# Patient Record
Sex: Female | Born: 1955 | Race: Black or African American | Hispanic: No | Marital: Married | Smoking: Former smoker
Health system: Southern US, Community
[De-identification: ages and names within clinical notes are randomized; demographics above are authoritative.]

## PROBLEM LIST (undated history)

## (undated) DIAGNOSIS — C50919 Malignant neoplasm of unspecified site of unspecified female breast: Secondary | ICD-10-CM

## (undated) DIAGNOSIS — I82409 Acute embolism and thrombosis of unspecified deep veins of unspecified lower extremity: Secondary | ICD-10-CM

## (undated) DIAGNOSIS — G629 Polyneuropathy, unspecified: Secondary | ICD-10-CM

## (undated) DIAGNOSIS — J45909 Unspecified asthma, uncomplicated: Secondary | ICD-10-CM

## (undated) DIAGNOSIS — T7840XA Allergy, unspecified, initial encounter: Secondary | ICD-10-CM

## (undated) DIAGNOSIS — K219 Gastro-esophageal reflux disease without esophagitis: Secondary | ICD-10-CM

## (undated) DIAGNOSIS — I10 Essential (primary) hypertension: Secondary | ICD-10-CM

## (undated) DIAGNOSIS — E119 Type 2 diabetes mellitus without complications: Secondary | ICD-10-CM

## (undated) DIAGNOSIS — B029 Zoster without complications: Secondary | ICD-10-CM

## (undated) HISTORY — PX: BREAST SURGERY: SHX581

## (undated) HISTORY — DX: Allergy, unspecified, initial encounter: T78.40XA

## (undated) HISTORY — PX: CHOLECYSTECTOMY: SHX55

---

## 2002-07-30 ENCOUNTER — Encounter: Payer: Self-pay | Admitting: General Surgery

## 2002-07-30 ENCOUNTER — Encounter (INDEPENDENT_AMBULATORY_CARE_PROVIDER_SITE_OTHER): Payer: Self-pay | Admitting: Specialist

## 2002-07-30 ENCOUNTER — Other Ambulatory Visit: Admission: RE | Admit: 2002-07-30 | Discharge: 2002-07-30 | Payer: Self-pay | Admitting: General Surgery

## 2002-07-30 ENCOUNTER — Encounter: Admission: RE | Admit: 2002-07-30 | Discharge: 2002-07-30 | Payer: Self-pay | Admitting: General Surgery

## 2002-08-12 ENCOUNTER — Encounter: Admission: RE | Admit: 2002-08-12 | Discharge: 2002-08-12 | Payer: Self-pay | Admitting: General Surgery

## 2002-08-12 ENCOUNTER — Encounter: Payer: Self-pay | Admitting: General Surgery

## 2002-08-14 ENCOUNTER — Encounter: Payer: Self-pay | Admitting: General Surgery

## 2002-08-14 ENCOUNTER — Encounter (INDEPENDENT_AMBULATORY_CARE_PROVIDER_SITE_OTHER): Payer: Self-pay | Admitting: *Deleted

## 2002-08-14 ENCOUNTER — Ambulatory Visit (HOSPITAL_BASED_OUTPATIENT_CLINIC_OR_DEPARTMENT_OTHER): Admission: RE | Admit: 2002-08-14 | Discharge: 2002-08-15 | Payer: Self-pay | Admitting: General Surgery

## 2002-08-14 ENCOUNTER — Encounter: Admission: RE | Admit: 2002-08-14 | Discharge: 2002-08-14 | Payer: Self-pay | Admitting: General Surgery

## 2002-08-22 ENCOUNTER — Ambulatory Visit: Admission: RE | Admit: 2002-08-22 | Discharge: 2002-09-16 | Payer: Self-pay | Admitting: General Surgery

## 2002-09-12 ENCOUNTER — Ambulatory Visit (HOSPITAL_BASED_OUTPATIENT_CLINIC_OR_DEPARTMENT_OTHER): Admission: RE | Admit: 2002-09-12 | Discharge: 2002-09-12 | Payer: Self-pay | Admitting: General Surgery

## 2002-09-12 ENCOUNTER — Encounter: Payer: Self-pay | Admitting: General Surgery

## 2002-12-25 ENCOUNTER — Ambulatory Visit: Admission: RE | Admit: 2002-12-25 | Discharge: 2003-03-25 | Payer: Self-pay | Admitting: Radiation Oncology

## 2003-01-01 ENCOUNTER — Encounter: Admission: RE | Admit: 2003-01-01 | Discharge: 2003-01-01 | Payer: Self-pay | Admitting: Radiation Oncology

## 2003-01-08 ENCOUNTER — Ambulatory Visit (HOSPITAL_BASED_OUTPATIENT_CLINIC_OR_DEPARTMENT_OTHER): Admission: RE | Admit: 2003-01-08 | Discharge: 2003-01-08 | Payer: Self-pay | Admitting: General Surgery

## 2003-04-29 ENCOUNTER — Encounter: Payer: Self-pay | Admitting: *Deleted

## 2003-04-29 ENCOUNTER — Ambulatory Visit (HOSPITAL_COMMUNITY): Admission: RE | Admit: 2003-04-29 | Discharge: 2003-04-29 | Payer: Self-pay | Admitting: *Deleted

## 2003-05-08 ENCOUNTER — Ambulatory Visit (HOSPITAL_COMMUNITY): Admission: RE | Admit: 2003-05-08 | Discharge: 2003-05-08 | Payer: Self-pay | Admitting: General Surgery

## 2003-05-08 ENCOUNTER — Encounter: Payer: Self-pay | Admitting: General Surgery

## 2003-05-09 ENCOUNTER — Encounter: Payer: Self-pay | Admitting: General Surgery

## 2003-05-27 ENCOUNTER — Encounter: Payer: Self-pay | Admitting: General Surgery

## 2003-05-27 ENCOUNTER — Ambulatory Visit (HOSPITAL_COMMUNITY): Admission: RE | Admit: 2003-05-27 | Discharge: 2003-05-27 | Payer: Self-pay | Admitting: General Surgery

## 2003-05-27 ENCOUNTER — Encounter (INDEPENDENT_AMBULATORY_CARE_PROVIDER_SITE_OTHER): Payer: Self-pay | Admitting: *Deleted

## 2003-08-11 ENCOUNTER — Encounter: Payer: Self-pay | Admitting: General Surgery

## 2003-08-11 ENCOUNTER — Encounter: Admission: RE | Admit: 2003-08-11 | Discharge: 2003-08-11 | Payer: Self-pay | Admitting: General Surgery

## 2004-06-08 ENCOUNTER — Ambulatory Visit (HOSPITAL_COMMUNITY): Admission: RE | Admit: 2004-06-08 | Discharge: 2004-06-08 | Payer: Self-pay | Admitting: General Surgery

## 2004-08-13 ENCOUNTER — Encounter: Admission: RE | Admit: 2004-08-13 | Discharge: 2004-08-13 | Payer: Self-pay | Admitting: General Surgery

## 2005-01-20 ENCOUNTER — Ambulatory Visit: Payer: Self-pay | Admitting: Hematology & Oncology

## 2005-07-14 ENCOUNTER — Ambulatory Visit: Payer: Self-pay | Admitting: Hematology & Oncology

## 2005-11-11 ENCOUNTER — Encounter: Admission: RE | Admit: 2005-11-11 | Discharge: 2005-11-11 | Payer: Self-pay | Admitting: Hematology & Oncology

## 2006-01-06 ENCOUNTER — Ambulatory Visit: Payer: Self-pay | Admitting: Hematology & Oncology

## 2006-10-04 ENCOUNTER — Ambulatory Visit: Payer: Self-pay | Admitting: Hematology & Oncology

## 2006-10-04 LAB — CBC WITH DIFFERENTIAL/PLATELET
BASO%: 0.2 % (ref 0.0–2.0)
Basophils Absolute: 0 10*3/uL (ref 0.0–0.1)
HGB: 15.3 g/dL (ref 11.6–15.9)
MONO#: 0.7 10*3/uL (ref 0.1–0.9)
Platelets: 288 10*3/uL (ref 145–400)
RBC: 4.92 10*6/uL (ref 3.70–5.32)
RDW: 12.3 % (ref 11.3–14.5)

## 2006-10-04 LAB — COMPREHENSIVE METABOLIC PANEL
ALT: 21 U/L (ref 0–40)
BUN: 14 mg/dL (ref 6–23)
CO2: 28 mEq/L (ref 19–32)
Calcium: 9.8 mg/dL (ref 8.4–10.5)
Chloride: 98 mEq/L (ref 96–112)
Creatinine, Ser: 0.8 mg/dL (ref 0.40–1.20)
Potassium: 3.7 mEq/L (ref 3.5–5.3)
Sodium: 137 mEq/L (ref 135–145)
Total Protein: 7.2 g/dL (ref 6.0–8.3)

## 2006-11-13 ENCOUNTER — Encounter: Admission: RE | Admit: 2006-11-13 | Discharge: 2006-11-13 | Payer: Self-pay | Admitting: Hematology & Oncology

## 2007-04-11 ENCOUNTER — Ambulatory Visit: Payer: Self-pay | Admitting: Hematology & Oncology

## 2007-04-16 LAB — COMPREHENSIVE METABOLIC PANEL
ALT: 27 U/L (ref 0–35)
BUN: 17 mg/dL (ref 6–23)
CO2: 29 mEq/L (ref 19–32)
Calcium: 10 mg/dL (ref 8.4–10.5)
Potassium: 3.7 mEq/L (ref 3.5–5.3)
Sodium: 141 mEq/L (ref 135–145)
Total Bilirubin: 0.4 mg/dL (ref 0.3–1.2)

## 2007-04-16 LAB — CBC WITH DIFFERENTIAL/PLATELET
Basophils Absolute: 0 10*3/uL (ref 0.0–0.1)
HGB: 15.3 g/dL (ref 11.6–15.9)
LYMPH%: 17 % (ref 14.0–48.0)
MCH: 31.2 pg (ref 26.0–34.0)
MCV: 89.1 fL (ref 81.0–101.0)
NEUT#: 11.6 10*3/uL — ABNORMAL HIGH (ref 1.5–6.5)
NEUT%: 77.6 % — ABNORMAL HIGH (ref 39.6–76.8)
Platelets: 314 10*3/uL (ref 145–400)
RDW: 12.6 % (ref 11.3–14.5)
lymph#: 2.5 10*3/uL (ref 0.9–3.3)

## 2007-07-12 ENCOUNTER — Ambulatory Visit: Payer: Self-pay | Admitting: Hematology & Oncology

## 2007-10-18 ENCOUNTER — Encounter: Admission: RE | Admit: 2007-10-18 | Discharge: 2007-10-18 | Payer: Self-pay | Admitting: Hematology & Oncology

## 2007-10-24 ENCOUNTER — Encounter: Admission: RE | Admit: 2007-10-24 | Discharge: 2007-10-24 | Payer: Self-pay | Admitting: Hematology & Oncology

## 2007-10-24 ENCOUNTER — Encounter (INDEPENDENT_AMBULATORY_CARE_PROVIDER_SITE_OTHER): Payer: Self-pay | Admitting: Diagnostic Radiology

## 2008-01-14 ENCOUNTER — Ambulatory Visit: Payer: Self-pay | Admitting: Hematology & Oncology

## 2008-03-31 ENCOUNTER — Ambulatory Visit: Payer: Self-pay | Admitting: Hematology & Oncology

## 2008-04-02 LAB — COMPREHENSIVE METABOLIC PANEL
ALT: 26 U/L (ref 0–35)
AST: 22 U/L (ref 0–37)
Alkaline Phosphatase: 61 U/L (ref 39–117)
CO2: 26 mEq/L (ref 19–32)
Total Bilirubin: 0.2 mg/dL — ABNORMAL LOW (ref 0.3–1.2)
Total Protein: 7.5 g/dL (ref 6.0–8.3)

## 2008-04-02 LAB — CBC WITH DIFFERENTIAL/PLATELET
Basophils Absolute: 0 10*3/uL (ref 0.0–0.1)
Eosinophils Absolute: 0.1 10*3/uL (ref 0.0–0.5)
LYMPH%: 27.4 % (ref 14.0–48.0)
MCH: 31.8 pg (ref 26.0–34.0)
MONO#: 0.5 10*3/uL (ref 0.1–0.9)
MONO%: 4.3 % (ref 0.0–13.0)
NEUT%: 67.6 % (ref 39.6–76.8)
RBC: 4.54 10*6/uL (ref 3.70–5.32)
RDW: 12.4 % (ref 11.3–14.5)
WBC: 11.4 10*3/uL — ABNORMAL HIGH (ref 3.9–10.0)
lymph#: 3.1 10*3/uL (ref 0.9–3.3)

## 2008-04-04 ENCOUNTER — Encounter: Admission: RE | Admit: 2008-04-04 | Discharge: 2008-04-04 | Payer: Self-pay | Admitting: General Surgery

## 2008-04-08 ENCOUNTER — Encounter: Admission: RE | Admit: 2008-04-08 | Discharge: 2008-04-08 | Payer: Self-pay | Admitting: General Surgery

## 2008-05-20 ENCOUNTER — Ambulatory Visit: Payer: Self-pay | Admitting: Hematology & Oncology

## 2011-05-13 NOTE — Op Note (Signed)
   NAME:  Brandi Roberts, Brandi Roberts                            ACCOUNT NO.:  [id number]   MEDICAL RECORD NO.:  [id number]                   PATIENT TYPE:  AMB   LOCATION:  DSC                                  FACILITY:  [facility]   PHYSICIAN:  Angelia Mould. Derrell Lolling, M.D.             DATE OF BIRTH:  Dec 01, 1956   DATE OF PROCEDURE:  01/08/2003  DATE OF DISCHARGE:                                 OPERATIVE REPORT   PREOPERATIVE DIAGNOSIS:  Carcinoma of the right breast.   POSTOPERATIVE DIAGNOSIS:  Carcinoma of the right breast.   PROCEDURE:  Removal of Port-A-Cath.   SURGEON:  Angelia Mould. Derrell Lolling, M.D.   OPERATIVE INDICATION:  This is a 55 year old black female who underwent a  right partial mastectomy and right axillary sentinel lymph node mapping and  biopsy on 08/14/02 for a T1C, N0 tumor.  She has completed her radiation  therapy and her chemotherapy and requests that the Port-A-Cath be removed.  She has been counseled regarding this procedure and is brought to the minor  procedure room at [facility] electively.   DESCRIPTION OF PROCEDURE:  The patient was placed supine on the operating  table and the right anterior chest was prepped and draped in a sterile  fashion.  Xylocaine 1% with epinephrine was used as a local infiltration  anesthetic.  A transverse elliptical incision was made, excising the old  scar, which was somewhat hypertrophied.  Dissection was carried down through  the subcutaneous tissue until I entered the capsule of the port.  The port  was dissected free from the capsule.  There were some adhesions of the hub  of the port that were dissected free, and then we were able to simply remove  the port and the catheter intact.  The tip of the catheter was inspected and  found to be tapered and intact.  Hemostasis was excellent.  The catheter and  port were discarded.  The subcutaneous tissue was closed with interrupted  sutures of 3-0 Vicryl and the skin closed with a running  subcuticular suture  of 4-0 Vicryl and Steri-Strips.  Clean bandages were then placed and the  patient taken to the recovery room in stable condition.  Estimated blood  loss was about 5 cubic centimeters.  Complications:  None.  Sponge, needle,  and instrument counts were correct.                                               Angelia Mould. Derrell Lolling, M.D.    HMI/MEDQ  D:  01/08/2003  T:  01/08/2003  Job:  [id number]   cc:   Merilynn Finland, M.D.  [address] [facility]  [place]  [place] [address]  Fax: [phone]

## 2014-04-18 ENCOUNTER — Emergency Department (HOSPITAL_COMMUNITY): Payer: 59

## 2014-04-18 ENCOUNTER — Encounter (HOSPITAL_COMMUNITY): Payer: Self-pay | Admitting: Emergency Medicine

## 2014-04-18 ENCOUNTER — Emergency Department (HOSPITAL_COMMUNITY)
Admission: EM | Admit: 2014-04-18 | Discharge: 2014-04-18 | Disposition: A | Discharge status: Home / Self Care | Payer: 59 | Attending: Emergency Medicine | Admitting: Emergency Medicine

## 2014-04-18 DIAGNOSIS — Y9389 Activity, other specified: Secondary | ICD-10-CM | POA: Insufficient documentation

## 2014-04-18 DIAGNOSIS — Y929 Unspecified place or not applicable: Secondary | ICD-10-CM | POA: Insufficient documentation

## 2014-04-18 DIAGNOSIS — S63509A Unspecified sprain of unspecified wrist, initial encounter: Secondary | ICD-10-CM | POA: Insufficient documentation

## 2014-04-18 DIAGNOSIS — R296 Repeated falls: Secondary | ICD-10-CM | POA: Insufficient documentation

## 2014-04-18 DIAGNOSIS — S63501A Unspecified sprain of right wrist, initial encounter: Secondary | ICD-10-CM

## 2014-04-18 HISTORY — DX: Acute embolism and thrombosis of unspecified deep veins of unspecified lower extremity: I82.409

## 2014-04-18 HISTORY — DX: Essential (primary) hypertension: I10

## 2014-04-18 HISTORY — DX: Type 2 diabetes mellitus without complications: E11.9

## 2014-04-18 MED ORDER — HYDROCODONE-ACETAMINOPHEN 5-325 MG PO TABS: 2.0000 | ORAL_TABLET | ORAL | Status: DC | PRN

## 2014-04-18 NOTE — Discharge Instructions (Signed)
Joint Sprain °A sprain is a tear or stretch in the ligaments that hold a joint together. Severe sprains may need as long as 3-6 weeks of immobilization and/or exercises to heal completely. Sprained joints should be rested and protected. If not, they can become unstable and prone to re-injury. Proper treatment can reduce your pain, shorten the period of disability, and reduce the risk of repeated injuries. °TREATMENT  °· Rest and elevate the injured joint to reduce pain and swelling. °· Apply ice packs to the injury for 20-30 minutes every 2-3 hours for the next 2-3 days. °· Keep the injury wrapped in a compression bandage or splint as long as the joint is painful or as instructed by your caregiver. °· Do not use the injured joint until it is completely healed to prevent re-injury and chronic instability. Follow the instructions of your caregiver. °· Long-term sprain management may require exercises and/or treatment by a physical therapist. Taping or special braces may help stabilize the joint until it is completely better. °SEEK MEDICAL CARE IF:  °· You develop increased pain or swelling of the joint. °· You develop increasing redness and warmth of the joint. °· You develop a fever. °· It becomes stiff. °· Your hand or foot gets cold or numb. °Document Released: 01/19/2005 Document Revised: 03/05/2012 Document Reviewed: 12/29/2008 °ExitCare® Patient Information ©2014 [employer]. ° °

## 2014-04-18 NOTE — ED Provider Notes (Signed)
CSN: [id number]     Arrival date & time 04/18/14  1056 History   First MD Initiated Contact with Patient 04/18/14 1117     Chief Complaint  Patient presents with  . Wrist Pain     (Consider location/radiation/quality/duration/timing/severity/associated sxs/prior Treatment) Patient is a 58 y.o. female presenting with wrist pain. The history is provided by the patient. No language interpreter was used.  Wrist Pain This is a new problem. The current episode started today. The problem occurs constantly. The problem has been unchanged. Associated symptoms include joint swelling and myalgias. She has tried nothing for the symptoms. The treatment provided moderate relief.  Pt fell onto extended wrist yesterday.   Pt complains of swelling and pain.  Pain is worse with movement  No past medical history on file. No past surgical history on file. No family history on file. History  Substance Use Topics  . Smoking status: Not on file  . Smokeless tobacco: Not on file  . Alcohol Use: Not on file   OB History   No data available     Review of Systems  Musculoskeletal: Positive for joint swelling and myalgias.  All other systems reviewed and are negative.     Allergies  Review of patient's allergies indicates not on file.  Home Medications   Prior to Admission medications   Not on File   There were no vitals taken for this visit. Physical Exam  Constitutional: She is oriented to person, place, and time. She appears well-developed and well-nourished.  Musculoskeletal: She exhibits edema and tenderness.  Swollen tender right wrist,   Decreased range of motion,   nv and ns intact  Neurological: She is alert and oriented to person, place, and time. She has normal reflexes.  Skin: Skin is warm.  Psychiatric: She has a normal mood and affect.    ED Course  Procedures (including critical care time) Labs Review Labs Reviewed - No data to display  Imaging Review No results  found.   EKG Interpretation None      MDM   Final diagnoses:  Sprain of wrist, right    No fx Hydrocodone Wrist splint Follow up with Dr. Luna Glasgow for recheck in 1 week    Fransico Meadow, PA-C 04/18/14 [place], [place] 04/18/14 1212

## 2014-04-18 NOTE — ED Notes (Signed)
Fell and hurt rt wrist/hand yesterday. C/O pain with movement. Rt hand red and swollen.

## 2014-04-20 NOTE — ED Notes (Signed)
Pt called stating she needed a note for work until she could see ortho. Pt already has a note for this past Friday. Pt given a note for 4/27 and 4/28. Pt made aware she needed to follow up with ortho if she needed to be out of work longer

## 2014-04-20 NOTE — ED Provider Notes (Signed)
Medical screening examination/treatment/procedure(s) were performed by non-physician practitioner and as supervising physician I was immediately available for consultation/collaboration.   EKG Interpretation None        Alfonzo Feller, DO 04/20/14 1251

## 2016-12-27 ENCOUNTER — Emergency Department (HOSPITAL_COMMUNITY)
Admission: EM | Admit: 2016-12-27 | Discharge: 2016-12-27 | Disposition: A | Discharge status: Home / Self Care | Payer: 59 | Attending: Emergency Medicine | Admitting: Emergency Medicine

## 2016-12-27 ENCOUNTER — Emergency Department (HOSPITAL_COMMUNITY): Payer: 59

## 2016-12-27 ENCOUNTER — Encounter (HOSPITAL_COMMUNITY): Payer: Self-pay | Admitting: Emergency Medicine

## 2016-12-27 DIAGNOSIS — Y939 Activity, unspecified: Secondary | ICD-10-CM | POA: Insufficient documentation

## 2016-12-27 DIAGNOSIS — Z7984 Long term (current) use of oral hypoglycemic drugs: Secondary | ICD-10-CM | POA: Diagnosis not present

## 2016-12-27 DIAGNOSIS — F17210 Nicotine dependence, cigarettes, uncomplicated: Secondary | ICD-10-CM | POA: Diagnosis not present

## 2016-12-27 DIAGNOSIS — Y999 Unspecified external cause status: Secondary | ICD-10-CM | POA: Insufficient documentation

## 2016-12-27 DIAGNOSIS — I10 Essential (primary) hypertension: Secondary | ICD-10-CM | POA: Diagnosis not present

## 2016-12-27 DIAGNOSIS — W2209XA Striking against other stationary object, initial encounter: Secondary | ICD-10-CM | POA: Diagnosis not present

## 2016-12-27 DIAGNOSIS — Z7901 Long term (current) use of anticoagulants: Secondary | ICD-10-CM | POA: Diagnosis not present

## 2016-12-27 DIAGNOSIS — E119 Type 2 diabetes mellitus without complications: Secondary | ICD-10-CM | POA: Insufficient documentation

## 2016-12-27 DIAGNOSIS — Y929 Unspecified place or not applicable: Secondary | ICD-10-CM | POA: Diagnosis not present

## 2016-12-27 DIAGNOSIS — S199XXA Unspecified injury of neck, initial encounter: Secondary | ICD-10-CM | POA: Diagnosis present

## 2016-12-27 DIAGNOSIS — Z79899 Other long term (current) drug therapy: Secondary | ICD-10-CM | POA: Diagnosis not present

## 2016-12-27 DIAGNOSIS — M542 Cervicalgia: Secondary | ICD-10-CM | POA: Diagnosis not present

## 2016-12-27 MED ORDER — HYDROCODONE-ACETAMINOPHEN 5-325 MG PO TABS: 1.0000 | ORAL_TABLET | ORAL | 0 refills | Status: DC | PRN

## 2016-12-27 MED ORDER — PREDNISONE 10 MG PO TABS: 20.0000 mg | ORAL_TABLET | Freq: Every day | ORAL | 0 refills | Status: DC

## 2016-12-27 MED ORDER — HYDROMORPHONE HCL 1 MG/ML IJ SOLN
1.0000 mg | Freq: Once | INTRAMUSCULAR | Status: AC
  Administered 2016-12-27: 1 mg via INTRAMUSCULAR
  Filled 2016-12-27: qty 1

## 2016-12-27 NOTE — ED Notes (Signed)
Patient transported to X-ray 

## 2016-12-27 NOTE — Discharge Instructions (Signed)
X-ray was good. Medication for pain and prednisone. Alternate ice and heat to neck.

## 2016-12-27 NOTE — ED Triage Notes (Signed)
Patient complains of neck and arm pain that started Sunday morning after waking up. Denies injury. Pt unable to turn head left or right. Pt states it feels like a catch in her neck.

## 2016-12-30 ENCOUNTER — Encounter (HOSPITAL_COMMUNITY): Payer: Self-pay | Admitting: Emergency Medicine

## 2016-12-30 ENCOUNTER — Emergency Department (HOSPITAL_COMMUNITY)
Admission: EM | Admit: 2016-12-30 | Discharge: 2016-12-30 | Disposition: A | Discharge status: Home / Self Care | Payer: 59 | Attending: Emergency Medicine | Admitting: Emergency Medicine

## 2016-12-30 DIAGNOSIS — Z79899 Other long term (current) drug therapy: Secondary | ICD-10-CM | POA: Diagnosis not present

## 2016-12-30 DIAGNOSIS — E119 Type 2 diabetes mellitus without complications: Secondary | ICD-10-CM | POA: Insufficient documentation

## 2016-12-30 DIAGNOSIS — M542 Cervicalgia: Secondary | ICD-10-CM | POA: Diagnosis not present

## 2016-12-30 DIAGNOSIS — Z7984 Long term (current) use of oral hypoglycemic drugs: Secondary | ICD-10-CM | POA: Insufficient documentation

## 2016-12-30 DIAGNOSIS — F17210 Nicotine dependence, cigarettes, uncomplicated: Secondary | ICD-10-CM | POA: Insufficient documentation

## 2016-12-30 DIAGNOSIS — R21 Rash and other nonspecific skin eruption: Secondary | ICD-10-CM | POA: Diagnosis present

## 2016-12-30 DIAGNOSIS — Z7901 Long term (current) use of anticoagulants: Secondary | ICD-10-CM | POA: Insufficient documentation

## 2016-12-30 DIAGNOSIS — I10 Essential (primary) hypertension: Secondary | ICD-10-CM | POA: Insufficient documentation

## 2016-12-30 DIAGNOSIS — B029 Zoster without complications: Secondary | ICD-10-CM | POA: Diagnosis not present

## 2016-12-30 MED ORDER — ACYCLOVIR 800 MG PO TABS: 800.0000 mg | ORAL_TABLET | Freq: Every day | ORAL | 0 refills | Status: AC

## 2016-12-30 MED ORDER — OXYCODONE-ACETAMINOPHEN 5-325 MG PO TABS
2.0000 | ORAL_TABLET | Freq: Once | ORAL | Status: AC
  Administered 2016-12-30: 2 via ORAL
  Filled 2016-12-30: qty 2

## 2016-12-30 MED ORDER — OXYCODONE-ACETAMINOPHEN 5-325 MG PO TABS: 1.0000 | ORAL_TABLET | ORAL | 0 refills | Status: DC | PRN

## 2016-12-30 MED ORDER — ACYCLOVIR 800 MG PO TABS
800.0000 mg | ORAL_TABLET | Freq: Once | ORAL | Status: AC
  Administered 2016-12-30: 800 mg via ORAL
  Filled 2016-12-30: qty 1

## 2016-12-30 NOTE — ED Provider Notes (Signed)
[facility] DEPT Provider Note   CSN: [id number] Arrival date & time: 12/30/16  0954     History   Chief Complaint Chief Complaint  Patient presents with  . Neck Pain  . Herpes Zoster    HPI Brandi Roberts is a 61 y.o. female who was seen here 3 days ago for right sided neck pain and was treated for was seemed at the time to be of musculoskeletal source, returns here for persistent pain and now a rash along her upper back tracking down to her right antecubital space.  She endorses constant pain that is not relieved by the hydrocodone or the prednisone she was prescribed.  She denies fevers chills, headache, facial pain, myalgias or other complaint.  She does endorse having chicken pox as a child.  She has not obtained the shingles vaccine.  The history is provided by the patient.    Past Medical History:  Diagnosis Date  . Diabetes mellitus without complication ([place])   . DVT (deep venous thrombosis) ([place])   . Hypertension     There are no active problems to display for this patient.   Past Surgical History:  Procedure Laterality Date  . BREAST SURGERY    . CHOLECYSTECTOMY      OB History    No data available       Home Medications    Prior to Admission medications   Medication Sig Start Date End Date Taking? Authorizing Provider  acyclovir (ZOVIRAX) 800 MG tablet Take 1 tablet (800 mg total) by mouth 5 (five) times daily. 12/30/16 01/06/17  Evalee Jefferson, PA-C  HYDROcodone-acetaminophen (NORCO/VICODIN) 5-325 MG tablet Take 1-2 tablets by mouth every 4 (four) hours as needed. 12/27/16   Nat Christen, MD  metFORMIN (GLUCOPHAGE) 500 MG tablet Take by mouth 2 (two) times daily with a meal.    Historical Provider, MD  omeprazole (PRILOSEC) 20 MG capsule Take 1 capsule by mouth daily. 03/10/14   Historical Provider, MD  oxyCODONE-acetaminophen (PERCOCET/ROXICET) 5-325 MG tablet Take 1-2 tablets by mouth every 4 (four) hours as needed for severe pain. 12/30/16   Evalee Jefferson, PA-C    predniSONE (DELTASONE) 10 MG tablet Take 2 tablets (20 mg total) by mouth daily. 12/27/16   Nat Christen, MD  traMADol (ULTRAM) 50 MG tablet Take 1 tablet by mouth 2 (two) times daily. 04/16/14   Historical Provider, MD  triamterene-hydrochlorothiazide (MAXZIDE) 75-50 MG per tablet Take 1 tablet by mouth daily. 01/16/14   Historical Provider, MD  warfarin (COUMADIN) 7.5 MG tablet Take 5-7.5 mg by mouth daily. Alternates with 7.5 and 5 every other day. 04/04/14   Historical Provider, MD    Family History No family history on file.  Social History Social History  Substance Use Topics  . Smoking status: Current Every Day Smoker    Packs/day: 0.50    Years: 15.00    Types: Cigarettes  . Smokeless tobacco: Never Used  . Alcohol use No     Allergies   Levaquin [levofloxacin]   Review of Systems Review of Systems  Constitutional: Negative for chills and fever.  Respiratory: Negative for shortness of breath and wheezing.   Musculoskeletal: Positive for neck pain.  Skin: Positive for rash.  Neurological: Negative for numbness.     Physical Exam Updated Vital Signs BP 189/84 (BP Location: Left Arm)   Pulse 99   Temp 98.9 F (37.2 C) (Oral)   Resp 16   Ht 5\' 5"  (1.651 m)   Wt 86.2 kg  SpO2 99%   BMI 31.62 kg/m   Physical Exam  Constitutional: She appears well-developed and well-nourished. No distress.  HENT:  Head: Normocephalic.  Neck: Neck supple.  Cardiovascular: Normal rate.   Pulmonary/Chest: Effort normal. She has no wheezes.  Musculoskeletal: Normal range of motion. She exhibits no edema.  Skin: Rash noted. Rash is vesicular.  Intact clear fluid filled vesicles on red base clustered on right posterior upper back/shoulder and more linear pattern to right antecubital space.       ED Treatments / Results  Labs (all labs ordered are listed, but only abnormal results are displayed) Labs Reviewed - No data to display  EKG  EKG Interpretation None        Radiology No results found.  Procedures Procedures (including critical care time)  Medications Ordered in ED Medications  oxyCODONE-acetaminophen (PERCOCET/ROXICET) 5-325 MG per tablet 2 tablet (not administered)  acyclovir (ZOVIRAX) tablet 800 mg (not administered)     Initial Impression / Assessment and Plan / ED Course  I have reviewed the triage vital signs and the nursing notes.  Pertinent labs & imaging results that were available during my care of the patient were reviewed by me and considered in my medical decision making (see chart for details).  Clinical Course     Percocet, acyclovir.  F/u with pcp in one week for a recheck.  Discussed contagious nature of this infection and which populations to avoid.  Works in a nursing home.  Advised she cannot return to work until cleared by her MD.  Her husband is vaccinated for shingles.  She is not around infants or immunocompromised persons.  Final Clinical Impressions(s) / ED Diagnoses   Final diagnoses:  Herpes zoster without complication    New Prescriptions New Prescriptions   ACYCLOVIR (ZOVIRAX) 800 MG TABLET    Take 1 tablet (800 mg total) by mouth 5 (five) times daily.   OXYCODONE-ACETAMINOPHEN (PERCOCET/ROXICET) 5-325 MG TABLET    Take 1-2 tablets by mouth every 4 (four) hours as needed for severe pain.     Evalee Jefferson, PA-C 12/30/16 1108    Merrily Pew, MD 12/30/16 [phone]

## 2016-12-30 NOTE — ED Triage Notes (Signed)
Patient seen here Tuesday for neck pain states no relief. Also complains of rash to right shoulder and arm with burning pain that radiates down arm that started last night.

## 2016-12-31 NOTE — ED Provider Notes (Signed)
[facility] DEPT Provider Note   CSN: [id number] Arrival date & time: 12/27/16  1104     History   Chief Complaint Chief Complaint  Patient presents with  . Neck Pain  . Arm Pain    HPI Brandi Roberts is a 61 y.o. female.  Pain in lateral neck for several days.  Patient had a trunk of a car accidentally strike the neck area. No meningeal signs, fever, chills, neurological deficits, rash.  Severity of symptoms is moderate. Nothing makes symptoms better or worse.      Past Medical History:  Diagnosis Date  . Diabetes mellitus without complication ([place])   . DVT (deep venous thrombosis) ([place])   . Hypertension     There are no active problems to display for this patient.   Past Surgical History:  Procedure Laterality Date  . BREAST SURGERY    . CHOLECYSTECTOMY      OB History    No data available       Home Medications    Prior to Admission medications   Medication Sig Start Date End Date Taking? Authorizing Provider  metFORMIN (GLUCOPHAGE) 500 MG tablet Take by mouth 2 (two) times daily with a meal.   Yes Historical Provider, MD  omeprazole (PRILOSEC) 20 MG capsule Take 1 capsule by mouth daily. 03/10/14  Yes Historical Provider, MD  traMADol (ULTRAM) 50 MG tablet Take 1 tablet by mouth 2 (two) times daily. 04/16/14  Yes Historical Provider, MD  triamterene-hydrochlorothiazide (MAXZIDE) 75-50 MG per tablet Take 1 tablet by mouth daily. 01/16/14  Yes Historical Provider, MD  warfarin (COUMADIN) 7.5 MG tablet Take 5-7.5 mg by mouth daily. Alternates with 7.5 and 5 every other day. 04/04/14  Yes Historical Provider, MD  acyclovir (ZOVIRAX) 800 MG tablet Take 1 tablet (800 mg total) by mouth 5 (five) times daily. 12/30/16 01/06/17  Evalee Jefferson, PA-C  HYDROcodone-acetaminophen (NORCO/VICODIN) 5-325 MG tablet Take 1-2 tablets by mouth every 4 (four) hours as needed. 12/27/16   Nat Christen, MD  oxyCODONE-acetaminophen (PERCOCET/ROXICET) 5-325 MG tablet Take 1-2 tablets by mouth  every 4 (four) hours as needed for severe pain. 12/30/16   Evalee Jefferson, PA-C  predniSONE (DELTASONE) 10 MG tablet Take 2 tablets (20 mg total) by mouth daily. 12/27/16   Nat Christen, MD    Family History No family history on file.  Social History Social History  Substance Use Topics  . Smoking status: Current Every Day Smoker    Packs/day: 0.50    Years: 15.00    Types: Cigarettes  . Smokeless tobacco: Never Used  . Alcohol use No     Allergies   Levaquin [levofloxacin]   Review of Systems Review of Systems  All other systems reviewed and are negative.    Physical Exam Updated Vital Signs BP 164/68 (BP Location: Left Arm)   Pulse 65   Temp 98.6 F (37 C) (Oral)   Resp 18   Ht 5\' 5"  (1.651 m)   Wt 190 lb (86.2 kg)   SpO2 98%   BMI 31.62 kg/m   Physical Exam  Constitutional: She is oriented to person, place, and time. She appears well-developed and well-nourished.  HENT:  Head: Normocephalic and atraumatic.  Eyes: Conjunctivae are normal.  Neck: Neck supple.  Tender to palpation right lateral neck with radiation to shoulder. No meningeal signs  Cardiovascular: Normal rate and regular rhythm.   Pulmonary/Chest: Effort normal and breath sounds normal.  Abdominal: Soft. Bowel sounds are normal.  Musculoskeletal: Normal range  of motion.  Neurological: She is alert and oriented to person, place, and time.  Skin: Skin is warm and dry.  Psychiatric: She has a normal mood and affect. Her behavior is normal.  Nursing note and vitals reviewed.    ED Treatments / Results  Labs (all labs ordered are listed, but only abnormal results are displayed) Labs Reviewed - No data to display  EKG  EKG Interpretation None       Radiology No results found.  Procedures Procedures (including critical care time)  Medications Ordered in ED Medications  HYDROmorphone (DILAUDID) injection 1 mg (1 mg Intramuscular Given 12/27/16 1459)     Initial Impression / Assessment and  Plan / ED Course  I have reviewed the triage vital signs and the nursing notes.  Pertinent labs & imaging results that were available during my care of the patient were reviewed by me and considered in my medical decision making (see chart for details).  Clinical Course     History of physical most consistent with musculoskeletal strain. Discharge medications Vicodin and prednisone. Patient will return if worse.  Final Clinical Impressions(s) / ED Diagnoses   Final diagnoses:  Neck pain    New Prescriptions Discharge Medication List as of 12/27/2016  3:52 PM    START taking these medications   Details  HYDROcodone-acetaminophen (NORCO/VICODIN) 5-325 MG tablet Take 1-2 tablets by mouth every 4 (four) hours as needed., Starting Tue 12/27/2016, Print    predniSONE (DELTASONE) 10 MG tablet Take 2 tablets (20 mg total) by mouth daily., Starting Tue 12/27/2016, Print         Nat Christen, MD 12/31/16 1539

## 2017-01-19 ENCOUNTER — Encounter (HOSPITAL_COMMUNITY): Payer: Self-pay

## 2017-01-19 ENCOUNTER — Emergency Department (HOSPITAL_COMMUNITY): Payer: 59

## 2017-01-19 ENCOUNTER — Emergency Department (HOSPITAL_COMMUNITY)
Admission: EM | Admit: 2017-01-19 | Discharge: 2017-01-19 | Disposition: A | Discharge status: Home / Self Care | Payer: 59 | Attending: Emergency Medicine | Admitting: Emergency Medicine

## 2017-01-19 DIAGNOSIS — Z79899 Other long term (current) drug therapy: Secondary | ICD-10-CM | POA: Insufficient documentation

## 2017-01-19 DIAGNOSIS — Z7984 Long term (current) use of oral hypoglycemic drugs: Secondary | ICD-10-CM | POA: Diagnosis not present

## 2017-01-19 DIAGNOSIS — I10 Essential (primary) hypertension: Secondary | ICD-10-CM | POA: Insufficient documentation

## 2017-01-19 DIAGNOSIS — Z7901 Long term (current) use of anticoagulants: Secondary | ICD-10-CM | POA: Diagnosis not present

## 2017-01-19 DIAGNOSIS — F17210 Nicotine dependence, cigarettes, uncomplicated: Secondary | ICD-10-CM | POA: Diagnosis not present

## 2017-01-19 DIAGNOSIS — M792 Neuralgia and neuritis, unspecified: Secondary | ICD-10-CM | POA: Insufficient documentation

## 2017-01-19 DIAGNOSIS — R21 Rash and other nonspecific skin eruption: Secondary | ICD-10-CM | POA: Insufficient documentation

## 2017-01-19 DIAGNOSIS — M542 Cervicalgia: Secondary | ICD-10-CM | POA: Diagnosis not present

## 2017-01-19 DIAGNOSIS — E119 Type 2 diabetes mellitus without complications: Secondary | ICD-10-CM | POA: Insufficient documentation

## 2017-01-19 HISTORY — DX: Zoster without complications: B02.9

## 2017-01-19 MED ORDER — METHYLPREDNISOLONE 4 MG PO TBPK: ORAL_TABLET | ORAL | 0 refills | Status: DC

## 2017-01-19 MED ORDER — GABAPENTIN 300 MG PO CAPS: ORAL_CAPSULE | ORAL | 0 refills | Status: DC

## 2017-01-19 MED ORDER — OXYCODONE-ACETAMINOPHEN 5-325 MG PO TABS: 2.0000 | ORAL_TABLET | ORAL | 0 refills | Status: DC | PRN

## 2017-01-19 NOTE — Discharge Instructions (Signed)
Your pain is likely related to your recent zoster infection. You can continue to have pain following the cessation of the rash. Please take the steroid dosepak as prescribed. Take the pain medicine as needed. I'm also giving her a prescription for Neurontin which is a nerve medication. Please take as prescribed. Please follow-up at your doctor's appointment tomorrow. You will likely benefit from further imaging or injections. This could also be a pinched nerve and may need further imagine by your primary care doctor. Return to the ED if you develops any worsening symptoms.

## 2017-01-19 NOTE — ED Triage Notes (Signed)
PT HERE C/O CONTINUED PAIN TO THE BACK OF THE NECK RADIATING DOWN THE RIGHT ARM DUE TO THE SHINGLES. PT WAS DX'D AND TX'D AT [facility] 12/27/16, BUT STS THE PAIN IS NOT GETTING BETTER. PT WAS TOLD TO STOP TAKING THE ACYCLOVIR, BUT IS STILL TAKING THE PAIN AND ABX AS PRESCRIBED. DENIES FEVER.

## 2017-01-19 NOTE — ED Notes (Signed)
Provider at bedside

## 2017-01-19 NOTE — ED Provider Notes (Signed)
[facility] DEPT Provider Note   CSN: [id number] Arrival date & time: 01/19/17  1406  By signing my name below, I, Neta Mends, attest that this documentation has been prepared under the direction and in the presence of Melina Schools, PA-C. Electronically Signed: Neta Mends, ED Scribe. 01/19/2017. 4:33 PM.    History   Chief Complaint Chief Complaint  Patient presents with  . Rash    The history is provided by the patient. No language interpreter was used.   HPI Comments:  Brandi Roberts is a 61 y.o. female who presents to the Emergency Department complaining of a worsening, constant neck and right arm pain due to shingles x 3 weeks. Pt states that she was seen at [facility] on 12/27/16 for neck and arm pain and was prescribed prednisone and hydrocodone. She states that she then woke up on 12/29/16 with a painful rash on her right arm and neck and went back to [facility] and was diagnosed with shingles and given antibiotics. She states that the rash in improving, but her neck and right arm are still in pain and have not resolved since initial onset. The pain is the same as when she had the rash. Pt denies visual changes, fever, nuchal rigidity, ha, rash, sick contacts, n/v/d, cp, sob, abd pain.    Past Medical History:  Diagnosis Date  . Diabetes mellitus without complication ([place])   . DVT (deep venous thrombosis) ([place])   . Hypertension   . Shingles     There are no active problems to display for this patient.   Past Surgical History:  Procedure Laterality Date  . BREAST SURGERY    . CHOLECYSTECTOMY      OB History    No data available       Home Medications    Prior to Admission medications   Medication Sig Start Date End Date Taking? Authorizing Provider  HYDROcodone-acetaminophen (NORCO/VICODIN) 5-325 MG tablet Take 1-2 tablets by mouth every 4 (four) hours as needed. 12/27/16   Nat Christen, MD  metFORMIN (GLUCOPHAGE) 500 MG tablet Take by mouth 2  (two) times daily with a meal.    Historical Provider, MD  omeprazole (PRILOSEC) 20 MG capsule Take 1 capsule by mouth daily. 03/10/14   Historical Provider, MD  oxyCODONE-acetaminophen (PERCOCET/ROXICET) 5-325 MG tablet Take 1-2 tablets by mouth every 4 (four) hours as needed for severe pain. 12/30/16   Evalee Jefferson, PA-C  predniSONE (DELTASONE) 10 MG tablet Take 2 tablets (20 mg total) by mouth daily. 12/27/16   Nat Christen, MD  traMADol (ULTRAM) 50 MG tablet Take 1 tablet by mouth 2 (two) times daily. 04/16/14   Historical Provider, MD  triamterene-hydrochlorothiazide (MAXZIDE) 75-50 MG per tablet Take 1 tablet by mouth daily. 01/16/14   Historical Provider, MD  warfarin (COUMADIN) 7.5 MG tablet Take 5-7.5 mg by mouth daily. Alternates with 7.5 and 5 every other day. 04/04/14   Historical Provider, MD    Family History History reviewed. No pertinent family history.  Social History Social History  Substance Use Topics  . Smoking status: Current Every Day Smoker    Packs/day: 0.50    Years: 15.00    Types: Cigarettes  . Smokeless tobacco: Never Used  . Alcohol use No     Allergies   Levaquin [levofloxacin]   Review of Systems Review of Systems  Constitutional: Negative for fever.  Eyes: Negative for visual disturbance.  Musculoskeletal: Positive for myalgias and neck pain.  Skin: Positive for  rash.  All other systems reviewed and are negative.    Physical Exam Updated Vital Signs BP 157/74 (BP Location: Left Arm)   Pulse 80   Temp 98.8 F (37.1 C) (Oral)   Resp 15   Ht 5\' 7"  (1.702 m)   Wt 177 lb (80.3 kg)   SpO2 98%   BMI 27.72 kg/m   Physical Exam  Constitutional: She is oriented to person, place, and time. She appears well-developed and well-nourished. No distress.  HENT:  Head: Normocephalic and atraumatic.  No rash on face.  Eyes: Conjunctivae and EOM are normal. Pupils are equal, round, and reactive to light.  Neck: Normal range of motion. Neck supple.  Full ROM.  No nuchal rigidity. No midline c spine tenderness. No deformity or step offs noted.   Cardiovascular: Normal rate.   Pulmonary/Chest: Effort normal.  Abdominal: She exhibits no distension.  Musculoskeletal:  Scabbed excoriated rash noted to the right arm and right lateral neck in a dermatomal pattern. No vesic el or leaking fluid noted. No erythema or edema noted.  Positive drop arm test on the right. Tender to touch. Full ROM of right arm. Cap refill normal. Sensation intact to sharp and dull touch.   Lymphadenopathy:    She has no cervical adenopathy.  Neurological: She is alert and oriented to person, place, and time.  The patient is alert, attentive, and oriented x 3. Speech is clear. Cranial nerve II-VII grossly intact. Negative pronator drift. Sensation intact. Strength 5/5 in all extremities. Reflexes 2+ and symmetric at biceps, triceps, knees, and ankles. Rapid alternating movement and fine finger movements intact. Romberg is absent. Posture and gait normal.   Skin: Skin is warm and dry.  Psychiatric: She has a normal mood and affect.  Nursing note and vitals reviewed.    ED Treatments / Results  DIAGNOSTIC STUDIES:  Oxygen Saturation is 98% on RA, normal by my interpretation.    COORDINATION OF CARE:  4:33 PM Discussed treatment plan with pt at bedside and pt agreed to plan.   Labs (all labs ordered are listed, but only abnormal results are displayed) Labs Reviewed - No data to display  EKG  EKG Interpretation None       Radiology No results found.  Procedures Procedures (including critical care time)  Medications Ordered in ED Medications - No data to display   Initial Impression / Assessment and Plan / ED Course  I have reviewed the triage vital signs and the nursing notes.  Pertinent labs & imaging results that were available during my care of the patient were reviewed by me and considered in my medical decision making (see chart for details).     Pt  presents with complaint of right lateral neck pain and right arm pain. Rash consistent with prior zoster. Vesicles have scabbed over. Pt is afebrile and not tachycardic. No focal neuro deficits. No nuchal rigidity. Low suspicion for CVA or meningitis. 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 Pain similar to when rash was presents. Pt given steroids dose pack, Neurontin, and pain meds. Pt has follow up appt with PCP in the AM. Pt was seen and examined by Dr. Johnney Killian who is agreeable to the above plan. Pt is afebrile and not tachycardic. Pt is hemodynamically stable, in NAD, & able to ambulate in the ED. Pain has been managed & has no complaints prior to dc. Pt is comfortable with above plan and is stable for discharge at this time. All questions were answered prior  to disposition. Strict return precautions for f/u to the ED were discussed.   Final Clinical Impressions(s) / ED Diagnoses   Final diagnoses:  Neck pain  Neuralgia    New Prescriptions Discharge Medication List as of 01/19/2017  5:05 PM    START taking these medications   Details  gabapentin (NEURONTIN) 300 MG capsule 300 mg orally on day 1, 300 mg twice a day on day 2, and 300 mg 3 times a day on day 3 and until control of symptoms, Print    methylPREDNISolone (MEDROL) 4 MG TBPK tablet As prescribed, Print    !! oxyCODONE-acetaminophen (PERCOCET/ROXICET) 5-325 MG tablet Take 2 tablets by mouth every 4 (four) hours as needed for severe pain., Starting Thu 01/19/2017, Print     !! - Potential duplicate medications found. Please discuss with provider.    I personally performed the services described in this documentation, which was scribed in my presence. The recorded information has been reviewed and is accurate.    Doristine Devoid, PA-C 01/24/17 0124    Charlesetta Shanks, MD 01/24/17 1600

## 2019-11-21 ENCOUNTER — Emergency Department (HOSPITAL_COMMUNITY)
Admission: EM | Admit: 2019-11-21 | Discharge: 2019-11-21 | Disposition: A | Discharge status: Home / Self Care | Payer: Medicare Other | Attending: Emergency Medicine | Admitting: Emergency Medicine

## 2019-11-21 ENCOUNTER — Other Ambulatory Visit: Payer: Self-pay

## 2019-11-21 ENCOUNTER — Encounter (HOSPITAL_COMMUNITY): Payer: Self-pay | Admitting: *Deleted

## 2019-11-21 DIAGNOSIS — Y999 Unspecified external cause status: Secondary | ICD-10-CM | POA: Diagnosis not present

## 2019-11-21 DIAGNOSIS — Y93G9 Activity, other involving cooking and grilling: Secondary | ICD-10-CM | POA: Insufficient documentation

## 2019-11-21 DIAGNOSIS — S61215A Laceration without foreign body of left ring finger without damage to nail, initial encounter: Secondary | ICD-10-CM | POA: Diagnosis present

## 2019-11-21 DIAGNOSIS — Z86718 Personal history of other venous thrombosis and embolism: Secondary | ICD-10-CM | POA: Diagnosis not present

## 2019-11-21 DIAGNOSIS — E119 Type 2 diabetes mellitus without complications: Secondary | ICD-10-CM | POA: Insufficient documentation

## 2019-11-21 DIAGNOSIS — Y92000 Kitchen of unspecified non-institutional (private) residence as  the place of occurrence of the external cause: Secondary | ICD-10-CM | POA: Diagnosis not present

## 2019-11-21 DIAGNOSIS — F17210 Nicotine dependence, cigarettes, uncomplicated: Secondary | ICD-10-CM | POA: Diagnosis not present

## 2019-11-21 DIAGNOSIS — I10 Essential (primary) hypertension: Secondary | ICD-10-CM | POA: Insufficient documentation

## 2019-11-21 DIAGNOSIS — Z79899 Other long term (current) drug therapy: Secondary | ICD-10-CM | POA: Insufficient documentation

## 2019-11-21 DIAGNOSIS — Z23 Encounter for immunization: Secondary | ICD-10-CM | POA: Diagnosis not present

## 2019-11-21 DIAGNOSIS — Y288XXA Contact with other sharp object, undetermined intent, initial encounter: Secondary | ICD-10-CM | POA: Diagnosis not present

## 2019-11-21 DIAGNOSIS — Z7984 Long term (current) use of oral hypoglycemic drugs: Secondary | ICD-10-CM | POA: Diagnosis not present

## 2019-11-21 MED ORDER — TETANUS-DIPHTH-ACELL PERTUSSIS 5-2.5-18.5 LF-MCG/0.5 IM SUSP
0.5000 mL | Freq: Once | INTRAMUSCULAR | Status: AC
  Administered 2019-11-21: 16:00:00 0.5 mL via INTRAMUSCULAR
  Filled 2019-11-21: qty 0.5

## 2019-11-21 MED ORDER — POVIDONE-IODINE 10 % EX SOLN
CUTANEOUS | Status: AC
  Administered 2019-11-21: 1 via TOPICAL
  Filled 2019-11-21: qty 15

## 2019-11-21 MED ORDER — POVIDONE-IODINE 10 % EX SOLN
CUTANEOUS | Status: DC | PRN
  Administered 2019-11-21: 16:00:00 1 via TOPICAL

## 2019-11-21 MED ORDER — CEPHALEXIN 500 MG PO CAPS: 500.0000 mg | ORAL_CAPSULE | Freq: Four times a day (QID) | ORAL | 0 refills | Status: AC

## 2019-11-21 MED ORDER — LIDOCAINE HCL (PF) 1 % IJ SOLN
5.0000 mL | Freq: Once | INTRAMUSCULAR | Status: AC
  Administered 2019-11-21: 16:00:00 5 mL
  Filled 2019-11-21: qty 6

## 2019-11-21 NOTE — ED Provider Notes (Signed)
[facility] EMERGENCY DEPARTMENT Provider Note   CSN: [id number] Arrival date & time: 11/21/19  1523     History   Chief Complaint Chief Complaint  Patient presents with  . Extremity Laceration    HPI Brandi Roberts is a 63 y.o. female presenting with laceration to her left distal ring finger which occurred about 10 am when attempting to open a can of green beans. She has tried applying bandage, sterile strips and attempted wound glue but the wound continues to bleed.  She denies radiation of pain and denies numbness of the finger.  Her tetanus is not current.  Pt is right handed.      The history is provided by the patient.    Past Medical History:  Diagnosis Date  . Diabetes mellitus without complication ([place])   . DVT (deep venous thrombosis) ([place])   . Hypertension   . Shingles     There are no active problems to display for this patient.   Past Surgical History:  Procedure Laterality Date  . BREAST SURGERY    . CHOLECYSTECTOMY       OB History   No obstetric history on file.      Home Medications    Prior to Admission medications   Medication Sig Start Date End Date Taking? Authorizing Provider  cephALEXin (KEFLEX) 500 MG capsule Take 1 capsule (500 mg total) by mouth 4 (four) times daily for 7 days. 11/21/19 11/28/19  Evalee Jefferson, PA-C  gabapentin (NEURONTIN) 300 MG capsule 300 mg orally on day 1, 300 mg twice a day on day 2, and 300 mg 3 times a day on day 3 and until control of symptoms 01/19/17   Doristine Devoid, PA-C  HYDROcodone-acetaminophen (NORCO/VICODIN) 5-325 MG tablet Take 1-2 tablets by mouth every 4 (four) hours as needed. 12/27/16   Nat Christen, MD  metFORMIN (GLUCOPHAGE) 500 MG tablet Take by mouth 2 (two) times daily with a meal.    [provider]  methylPREDNISolone (MEDROL) 4 MG TBPK tablet As prescribed 01/19/17   Ocie Cornfield T, PA-C  omeprazole (PRILOSEC) 20 MG capsule Take 1 capsule by mouth daily. 03/10/14   [provider]  oxyCODONE-acetaminophen (PERCOCET/ROXICET) 5-325 MG tablet Take 1-2 tablets by mouth every 4 (four) hours as needed for severe pain. 12/30/16   Evalee Jefferson, PA-C  oxyCODONE-acetaminophen (PERCOCET/ROXICET) 5-325 MG tablet Take 2 tablets by mouth every 4 (four) hours as needed for severe pain. 01/19/17   Doristine Devoid, PA-C  predniSONE (DELTASONE) 10 MG tablet Take 2 tablets (20 mg total) by mouth daily. 12/27/16   Nat Christen, MD  traMADol (ULTRAM) 50 MG tablet Take 1 tablet by mouth 2 (two) times daily. 04/16/14   [provider]  triamterene-hydrochlorothiazide (MAXZIDE) 75-50 MG per tablet Take 1 tablet by mouth daily. 01/16/14   [provider]  warfarin (COUMADIN) 7.5 MG tablet Take 5-7.5 mg by mouth daily. Alternates with 7.5 and 5 every other day. 04/04/14   [provider]    Family History No family history on file.  Social History Social History   Tobacco Use  . Smoking status: Current Every Day Smoker    Packs/day: 0.50    Years: 15.00    Pack years: 7.50    Types: Cigarettes  . Smokeless tobacco: Never Used  Substance Use Topics  . Alcohol use: No  . Drug use: No     Allergies   Levaquin [levofloxacin]   Review of Systems Review of  Systems  Constitutional: Negative for chills and fever.  Skin: Positive for wound.  Neurological: Negative for numbness.     Physical Exam Updated Vital Signs BP (!) 152/60 (BP Location: Left Arm)   Pulse 74   Temp 98.8 F (37.1 C) (Oral)   Resp 18   Ht 5\' 6"  (1.676 m)   Wt 96.6 kg   SpO2 98%   BMI 34.38 kg/m   Physical Exam Constitutional:      Appearance: She is well-developed.  HENT:     Head: Normocephalic.  Cardiovascular:     Rate and Rhythm: Normal rate.  Pulmonary:     Effort: Pulmonary effort is normal.  Musculoskeletal:        General: Tenderness present.  Skin:    Findings: Laceration present.     Comments: 1.5 cm flap laceration distal left ring finger just  inferior to the nail plate, volar.  No plate or nailbed involvement.  There appears to be a fair blood supply present on the flap side of the wound. Distal sensation intact.   Neurological:     Mental Status: She is alert and oriented to person, place, and time.     Sensory: No sensory deficit.      ED Treatments / Results  Labs (all labs ordered are listed, but only abnormal results are displayed) Labs Reviewed - No data to display  EKG None  Radiology No results found.  Procedures Procedures (including critical care time)  LACERATION REPAIR Performed by: Evalee Jefferson Authorized by: Evalee Jefferson Consent: Verbal consent obtained. Risks and benefits: risks, benefits and alternatives were discussed Consent given by: patient Patient identity confirmed: provided demographic data Prepped and Draped in normal sterile fashion Wound explored  Laceration Location: left right finger  Laceration Length: 1.5 cm  No Foreign Bodies seen or palpated  Anesthesia: digital block Local anesthetic: lidocaine 1% without epinephrine  Anesthetic total: 3 ml  Irrigation method: syringe Amount of cleaning: standard  Skin closure: ethilon 4-0  Number of sutures: 4  Technique: simple interrupted  Patient tolerance: Patient tolerated the procedure well with no immediate complications.   Medications Ordered in ED Medications  povidone-iodine (BETADINE) 10 % external solution (1 application Topical Given 11/21/19 1621)  lidocaine (PF) (XYLOCAINE) 1 % injection 5 mL (5 mLs Other Given by Other 11/21/19 1621)  Tdap (BOOSTRIX) injection 0.5 mL (0.5 mLs Intramuscular Given 11/21/19 1624)     Initial Impression / Assessment and Plan / ED Course  I have reviewed the triage vital signs and the nursing notes.  Pertinent labs & imaging results that were available during my care of the patient were reviewed by me and considered in my medical decision making (see chart for details).         Wound care instructions given.  Pt advised to have sutures removed in 10 days,  Return here sooner for any signs of infection including redness, swelling, worse pain or drainage of pus.     Final Clinical Impressions(s) / ED Diagnoses   Final diagnoses:  Laceration of left ring finger without foreign body without damage to nail, initial encounter    ED Discharge Orders         Ordered    cephALEXin (KEFLEX) 500 MG capsule  4 times daily     11/21/19 1718           Landis Martins 11/21/19 1911    Virgel Manifold, MD 11/22/19 2035

## 2019-11-21 NOTE — Discharge Instructions (Signed)
Have your sutures removed in 10 days.  Keep your wound clean and dry,  Until a good scab forms - you may then wash gently twice daily with mild soap and water, but dry completely after.  Get rechecked for any sign of infection (redness,  Swelling,  Increased pain or drainage of purulent fluid). ° °

## 2019-11-21 NOTE — ED Triage Notes (Signed)
Laceration to left ring finger, cut on a can

## 2019-11-21 NOTE — ED Notes (Signed)
Instructed pt to take all of antibiotics as prescribed. 

## 2021-10-03 ENCOUNTER — Emergency Department (HOSPITAL_COMMUNITY): Payer: Medicare Other

## 2021-10-03 ENCOUNTER — Emergency Department (HOSPITAL_COMMUNITY)
Admission: EM | Admit: 2021-10-03 | Discharge: 2021-10-03 | Disposition: A | Discharge status: Home / Self Care | Payer: Medicare Other | Attending: Emergency Medicine | Admitting: Emergency Medicine

## 2021-10-03 ENCOUNTER — Encounter (HOSPITAL_COMMUNITY): Payer: Self-pay | Admitting: Emergency Medicine

## 2021-10-03 ENCOUNTER — Other Ambulatory Visit: Payer: Self-pay

## 2021-10-03 DIAGNOSIS — Z79899 Other long term (current) drug therapy: Secondary | ICD-10-CM | POA: Diagnosis not present

## 2021-10-03 DIAGNOSIS — I10 Essential (primary) hypertension: Secondary | ICD-10-CM | POA: Diagnosis not present

## 2021-10-03 DIAGNOSIS — Z7984 Long term (current) use of oral hypoglycemic drugs: Secondary | ICD-10-CM | POA: Insufficient documentation

## 2021-10-03 DIAGNOSIS — F17210 Nicotine dependence, cigarettes, uncomplicated: Secondary | ICD-10-CM | POA: Diagnosis not present

## 2021-10-03 DIAGNOSIS — M542 Cervicalgia: Secondary | ICD-10-CM | POA: Diagnosis present

## 2021-10-03 DIAGNOSIS — Z7901 Long term (current) use of anticoagulants: Secondary | ICD-10-CM | POA: Diagnosis not present

## 2021-10-03 DIAGNOSIS — E119 Type 2 diabetes mellitus without complications: Secondary | ICD-10-CM | POA: Diagnosis not present

## 2021-10-03 LAB — CBC WITH DIFFERENTIAL/PLATELET
Abs Immature Granulocytes: 0.04 10*3/uL (ref 0.00–0.07)
Basophils Absolute: 0 10*3/uL (ref 0.0–0.1)
Basophils Relative: 0 %
Eosinophils Absolute: 0.1 10*3/uL (ref 0.0–0.5)
Eosinophils Relative: 1 %
HCT: 35.7 % — ABNORMAL LOW (ref 36.0–46.0)
Hemoglobin: 11.9 g/dL — ABNORMAL LOW (ref 12.0–15.0)
Immature Granulocytes: 0 %
Lymphocytes Relative: 19 %
Lymphs Abs: 2.2 10*3/uL (ref 0.7–4.0)
MCH: 31.6 pg (ref 26.0–34.0)
MCHC: 33.3 g/dL (ref 30.0–36.0)
MCV: 94.7 fL (ref 80.0–100.0)
Monocytes Absolute: 0.9 10*3/uL (ref 0.1–1.0)
Monocytes Relative: 7 %
Neutro Abs: 8.5 10*3/uL — ABNORMAL HIGH (ref 1.7–7.7)
Neutrophils Relative %: 73 %
Platelets: 244 10*3/uL (ref 150–400)
RBC: 3.77 MIL/uL — ABNORMAL LOW (ref 3.87–5.11)
RDW: 12.4 % (ref 11.5–15.5)
WBC: 11.7 10*3/uL — ABNORMAL HIGH (ref 4.0–10.5)
nRBC: 0 % (ref 0.0–0.2)

## 2021-10-03 LAB — BASIC METABOLIC PANEL
Anion gap: 9 (ref 5–15)
BUN: 19 mg/dL (ref 8–23)
CO2: 27 mmol/L (ref 22–32)
Calcium: 9.2 mg/dL (ref 8.9–10.3)
Chloride: 99 mmol/L (ref 98–111)
Creatinine, Ser: 1.25 mg/dL — ABNORMAL HIGH (ref 0.44–1.00)
GFR, Estimated: 47 mL/min — ABNORMAL LOW (ref >60)
Glucose, Bld: 99 mg/dL (ref 70–99)
Potassium: 4 mmol/L (ref 3.5–5.1)
Sodium: 135 mmol/L (ref 135–145)

## 2021-10-03 LAB — PROTIME-INR
INR: 1 (ref 0.8–1.2)
Prothrombin Time: 13.4 seconds (ref 11.4–15.2)

## 2021-10-03 MED ORDER — OXYCODONE-ACETAMINOPHEN 5-325 MG PO TABS
1.0000 | ORAL_TABLET | Freq: Once | ORAL | Status: AC
  Administered 2021-10-03: 1 via ORAL
  Filled 2021-10-03: qty 1

## 2021-10-03 MED ORDER — OXYCODONE-ACETAMINOPHEN 5-325 MG PO TABS: 1.0000 | ORAL_TABLET | Freq: Four times a day (QID) | ORAL | 0 refills | Status: DC | PRN

## 2021-10-03 MED ORDER — DIAZEPAM 2 MG PO TABS
2.0000 mg | ORAL_TABLET | Freq: Once | ORAL | Status: AC
  Administered 2021-10-03: 2 mg via ORAL
  Filled 2021-10-03: qty 1

## 2021-10-03 NOTE — Discharge Instructions (Addendum)
Take medication at home for comfort and keep the collar in place for comfort.  Imaging showed a thyroid nodule, you will need to continue to follow-up with your doctor regarding this nodule.  Return to the ER if your symptoms worsen, if you have fevers, new weakness numbness or any additional concerns.

## 2021-10-03 NOTE — ED Provider Notes (Signed)
[facility] EMERGENCY DEPARTMENT Provider Note   CSN: [id number] Arrival date & time: 10/03/21  1422     History Chief Complaint  Patient presents with   Neck Pain    C/o neck pain since Wednesday left side and then pain when away.  Then on Friday c/o neck on the right side.  Rates pain 10/10.  Pain increases with head movement.      Brandi Roberts is a 65 y.o. female.  Patient presents with bilateral neck pain.  Describes a sharp and aching initially on the left side 3 days ago and now for the past 2 days pain is more on the right side.  Worse when she turns her head a certain way.  Denies any fall or trauma denies any numbness or weakness.  No reports of fevers or cough or vomiting or diarrhea.  Patient states that she had been doing a lot of planting and moving pots about 4 to 5 days ago before she started to experience pain.      Past Medical History:  Diagnosis Date   Diabetes mellitus without complication ([place])    DVT (deep venous thrombosis) ([place])    Hypertension    Shingles     There are no problems to display for this patient.   Past Surgical History:  Procedure Laterality Date   BREAST SURGERY     CHOLECYSTECTOMY       OB History   No obstetric history on file.     History reviewed. No pertinent family history.  Social History   Tobacco Use   Smoking status: Every Day    Packs/day: 0.50    Years: 15.00    Pack years: 7.50    Types: Cigarettes   Smokeless tobacco: Never  Substance Use Topics   Alcohol use: No   Drug use: No    Home Medications Prior to Admission medications   Medication Sig Start Date End Date Taking? Authorizing Provider  albuterol (VENTOLIN HFA) 108 (90 Base) MCG/ACT inhaler Inhale 2 puffs into the lungs every 6 (six) hours as needed. 05/21/21   [provider]  ALPRAZolam Duanne Moron) 0.5 MG tablet Take 0.5 mg by mouth 2 (two) times daily as needed. 09/13/21   [provider]  amLODipine (NORVASC) 10 MG tablet Take  10 mg by mouth daily. 07/12/21   [provider]  atorvastatin (LIPITOR) 10 MG tablet Take 5 mg by mouth at bedtime. 05/10/21   [provider]  benazepril (LOTENSIN) 40 MG tablet Take 40 mg by mouth daily. 09/07/21   [provider]  doxazosin (CARDURA) 2 MG tablet Take 2 mg by mouth daily as needed. 09/07/21   [provider]  ELIQUIS 5 MG TABS tablet Take 5 mg by mouth 2 (two) times daily. 09/04/21   [provider]  gabapentin (NEURONTIN) 300 MG capsule 300 mg orally on day 1, 300 mg twice a day on day 2, and 300 mg 3 times a day on day 3 and until control of symptoms 01/19/17   Ocie Cornfield T, PA-C  gabapentin (NEURONTIN) 600 MG tablet Take 600 mg by mouth 3 (three) times daily. 08/06/21   [provider]  hydrALAZINE (APRESOLINE) 50 MG tablet Take 50 mg by mouth 2 (two) times daily. 09/15/21   [provider]  HYDROcodone-acetaminophen (NORCO/VICODIN) 5-325 MG tablet Take 1-2 tablets by mouth every 4 (four) hours as needed. 12/27/16   Nat Christen, MD  metFORMIN (GLUCOPHAGE) 1000 MG tablet Take 1,000 mg  by mouth 2 (two) times daily. 08/13/21   [provider]  metFORMIN (GLUCOPHAGE) 500 MG tablet Take by mouth 2 (two) times daily with a meal.    [provider]  methylPREDNISolone (MEDROL) 4 MG TBPK tablet As prescribed 01/19/17   Ocie Cornfield T, PA-C  metoprolol succinate (TOPROL-XL) 100 MG 24 hr tablet Take 100 mg by mouth daily. 09/07/21   [provider]  omeprazole (PRILOSEC) 20 MG capsule Take 1 capsule by mouth daily. 03/10/14   [provider]  oxyCODONE-acetaminophen (PERCOCET/ROXICET) 5-325 MG tablet Take 1-2 tablets by mouth every 4 (four) hours as needed for severe pain. 12/30/16   Evalee Jefferson, PA-C  oxyCODONE-acetaminophen (PERCOCET/ROXICET) 5-325 MG tablet Take 2 tablets by mouth every 4 (four) hours as needed for severe pain. 01/19/17   Doristine Devoid, PA-C  predniSONE (DELTASONE) 10  MG tablet Take 2 tablets (20 mg total) by mouth daily. 12/27/16   Nat Christen, MD  traMADol (ULTRAM) 50 MG tablet Take 1 tablet by mouth 2 (two) times daily. 04/16/14   [provider]  triamterene-hydrochlorothiazide (MAXZIDE) 75-50 MG per tablet Take 1 tablet by mouth daily. 01/16/14   [provider]  Vitamin D, Ergocalciferol, (DRISDOL) 1.25 MG (50000 UNIT) CAPS capsule Take 50,000 Units by mouth once a week. 09/13/21   [provider]  warfarin (COUMADIN) 7.5 MG tablet Take 5-7.5 mg by mouth daily. Alternates with 7.5 and 5 every other day. 04/04/14   [provider]    Allergies    Levaquin [levofloxacin]  Review of Systems   Review of Systems  Constitutional:  Negative for fever.  HENT:  Negative for ear pain.   Eyes:  Negative for pain.  Respiratory:  Negative for cough.   Cardiovascular:  Negative for chest pain.  Gastrointestinal:  Negative for abdominal pain.  Genitourinary:  Negative for flank pain.  Musculoskeletal:  Negative for back pain.  Skin:  Negative for rash.  Neurological:  Negative for headaches.   Physical Exam Updated Vital Signs BP (!) 167/62   Pulse 65   Temp 98.1 F (36.7 C) (Oral)   Resp 12   Ht 5\' 6"  (1.676 m)   Wt 94.3 kg   SpO2 100%   BMI 33.57 kg/m   Physical Exam Constitutional:      General: She is not in acute distress.    Appearance: Normal appearance.  HENT:     Head: Normocephalic.     Nose: Nose normal.  Eyes:     Extraocular Movements: Extraocular movements intact.  Neck:     Vascular: No carotid bruit.     Comments: Moderate C3-4 midline tenderness present.  No T-spine or L-spine tenderness noted.  Patient has 20 degrees range of motion to the right and 40 degrees range of motion to the left before moving her neck becomes too painful.  Otherwise no focal neurodeficit noted.  Forward flexion intact by 30 degrees and extension of the neck intact by 20 degrees. Cardiovascular:     Rate and Rhythm:  Normal rate.  Pulmonary:     Effort: Pulmonary effort is normal.  Musculoskeletal:        General: Normal range of motion.  Neurological:     General: No focal deficit present.     Mental Status: She is alert. Mental status is at baseline.    ED Results / Procedures / Treatments   Labs (all labs ordered are listed, but only abnormal results are displayed) Labs Reviewed  CBC  WITH DIFFERENTIAL/PLATELET - Abnormal; Notable for the following components:      Result Value   WBC 11.7 (*)    RBC 3.77 (*)    Hemoglobin 11.9 (*)    HCT 35.7 (*)    Neutro Abs 8.5 (*)    All other components within normal limits  BASIC METABOLIC PANEL - Abnormal; Notable for the following components:   Creatinine, Ser 1.25 (*)    GFR, Estimated 47 (*)    All other components within normal limits  PROTIME-INR    EKG EKG Interpretation  Date/Time:  Sunday October 03 2021 15:59:38 EDT Ventricular Rate:  59 PR Interval:  189 QRS Duration: 102 QT Interval:  426 QTC Calculation: 422 R Axis:   56 Text Interpretation: Sinus rhythm Ventricular premature complex Low voltage, extremity leads Confirmed by Thamas Jaegers ([id number]) on 10/03/2021 4:27:38 PM  Radiology CT Cervical Spine Wo Contrast  Result Date: 10/03/2021 CLINICAL DATA:  Intermittent right-sided neck pain for 3 days EXAM: CT CERVICAL SPINE WITHOUT CONTRAST TECHNIQUE: Multidetector CT imaging of the cervical spine was performed without intravenous contrast. Multiplanar CT image reconstructions were also generated. COMPARISON:  None. FINDINGS: Alignment: Alignment is grossly anatomic. Skull base and vertebrae: No acute fracture. No primary bone lesion or focal pathologic process. Soft tissues and spinal canal: Heterogeneous hypodense nodule within the lower pole left lobe thyroid measures 4.4 x 3.3 cm. While a thyroid goiter was identified on remote ultrasound in 2005, this area appears new since that examination. Prevertebral soft tissues are  unremarkable. Disc levels: Disc spaces are well preserved. No significant facet hypertrophy. Upper chest: Airway is patent.  Lung apices are clear. Other: Reconstructed images demonstrate no additional findings. IMPRESSION: 1. Unremarkable cervical spine. 2. Incidental 4.4 cm left lobe thyroid nodule. The patient is a known thyroid goiter, but most recent available thyroid imaging from 2005 does not demonstrate this abnormality within the left lobe. Recommend thyroid US if not recently performed. Electronically Signed   By: Randa Ngo M.D.   On: 10/03/2021 15:55    Procedures Procedures   Medications Ordered in ED Medications  oxyCODONE-acetaminophen (PERCOCET/ROXICET) 5-325 MG per tablet 1 tablet (1 tablet Oral Given 10/03/21 1557)  diazepam (VALIUM) tablet 2 mg (2 mg Oral Given 10/03/21 1557)    ED Course  I have reviewed the triage vital signs and the nursing notes.  Pertinent labs & imaging results that were available during my care of the patient were reviewed by me and considered in my medical decision making (see chart for details).    MDM Rules/Calculators/A&P                           Imaging of the neck is unremarkable however incidental finding of thyroid nodule.  Patient advised outpatient follow-up with her doctor regarding the nodule.  Pain and symptoms improved with medication provided here.  Advised to rest at home and follow-up with her doctor within the week.  Patient expressed understanding, discharged home in stable condition.  Final Clinical Impression(s) / ED Diagnoses Final diagnoses:  Neck pain    Rx / DC Orders ED Discharge Orders     None        Luna Fuse, MD 10/03/21 [phone]

## 2021-10-13 ENCOUNTER — Other Ambulatory Visit: Payer: Self-pay | Admitting: Internal Medicine

## 2021-10-13 DIAGNOSIS — Z1231 Encounter for screening mammogram for malignant neoplasm of breast: Secondary | ICD-10-CM

## 2021-11-03 ENCOUNTER — Encounter (HOSPITAL_COMMUNITY): Payer: Self-pay | Admitting: Radiology

## 2021-11-11 ENCOUNTER — Ambulatory Visit
Admission: RE | Admit: 2021-11-11 | Discharge: 2021-11-11 | Disposition: A | Discharge status: Home / Self Care | Payer: Medicare Other | Source: Ambulatory Visit | Attending: Internal Medicine | Admitting: Internal Medicine

## 2021-11-11 DIAGNOSIS — Z1231 Encounter for screening mammogram for malignant neoplasm of breast: Secondary | ICD-10-CM

## 2021-11-12 ENCOUNTER — Other Ambulatory Visit: Payer: Self-pay | Admitting: Internal Medicine

## 2021-11-12 DIAGNOSIS — R928 Other abnormal and inconclusive findings on diagnostic imaging of breast: Secondary | ICD-10-CM

## 2021-12-06 ENCOUNTER — Ambulatory Visit
Admission: RE | Admit: 2021-12-06 | Discharge: 2021-12-06 | Disposition: A | Discharge status: Home / Self Care | Payer: Medicare Other | Source: Ambulatory Visit | Attending: Internal Medicine | Admitting: Internal Medicine

## 2021-12-06 ENCOUNTER — Ambulatory Visit: Payer: Medicare Other

## 2021-12-06 ENCOUNTER — Other Ambulatory Visit: Payer: Self-pay

## 2021-12-06 ENCOUNTER — Other Ambulatory Visit: Payer: Self-pay | Admitting: Internal Medicine

## 2021-12-06 DIAGNOSIS — R928 Other abnormal and inconclusive findings on diagnostic imaging of breast: Secondary | ICD-10-CM

## 2021-12-06 DIAGNOSIS — R921 Mammographic calcification found on diagnostic imaging of breast: Secondary | ICD-10-CM

## 2021-12-14 ENCOUNTER — Other Ambulatory Visit: Payer: Medicare Other

## 2021-12-22 ENCOUNTER — Ambulatory Visit
Admission: RE | Admit: 2021-12-22 | Discharge: 2021-12-22 | Disposition: A | Discharge status: Home / Self Care | Payer: Medicare Other | Source: Ambulatory Visit | Attending: Internal Medicine | Admitting: Internal Medicine

## 2021-12-22 DIAGNOSIS — R921 Mammographic calcification found on diagnostic imaging of breast: Secondary | ICD-10-CM

## 2021-12-31 ENCOUNTER — Other Ambulatory Visit: Payer: Self-pay | Admitting: Surgery

## 2022-01-06 ENCOUNTER — Other Ambulatory Visit: Payer: Self-pay | Admitting: Surgery

## 2022-01-06 DIAGNOSIS — D0511 Intraductal carcinoma in situ of right breast: Secondary | ICD-10-CM

## 2022-01-13 ENCOUNTER — Encounter: Payer: Self-pay | Admitting: Plastic Surgery

## 2022-01-13 ENCOUNTER — Ambulatory Visit: Payer: Medicare Other | Admitting: Plastic Surgery

## 2022-01-13 ENCOUNTER — Other Ambulatory Visit: Payer: Self-pay

## 2022-01-13 VITALS — BP 177/79 | HR 82 | Ht 66.0 in | Wt 206.0 lb

## 2022-01-13 DIAGNOSIS — C50919 Malignant neoplasm of unspecified site of unspecified female breast: Secondary | ICD-10-CM

## 2022-01-13 NOTE — Progress Notes (Signed)
Referring Provider Marjo Bicker, MD No address on file   CC:  Chief Complaint  Patient presents with   Consult      Brandi Roberts is an 66 y.o. female.  HPI: Patient presents to discuss breast reconstruction.  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  She has had a recurrence.  She is planning a right sided unilateral mastectomy.  She is interested in reconstruction.  She does not smoke cigarettes.  She does have diabetes but this is controlled with metformin.  Her blood sugars usually run around 130.  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  She did require radiation and chemo in the past.  She is also interested in the left breast reduction done at some point in her reconstructive process.  She would like to be smaller than she currently is on both sides.  Allergies  Allergen Reactions   Levaquin [Levofloxacin] Nausea And Vomiting and Other (See Comments)    dizziness    Outpatient Encounter Medications as of 01/13/2022  Medication Sig   albuterol (VENTOLIN HFA) 108 (90 Base) MCG/ACT inhaler Inhale 2 puffs into the lungs every 6 (six) hours as needed.   ALPRAZolam (XANAX) 0.5 MG tablet Take 0.5 mg by mouth 2 (two) times daily as needed.   amLODipine (NORVASC) 10 MG tablet Take 10 mg by mouth daily.   atorvastatin (LIPITOR) 10 MG tablet Take 5 mg by mouth at bedtime.   benazepril (LOTENSIN) 40 MG tablet Take 40 mg by mouth daily.   doxazosin (CARDURA) 2 MG tablet Take 2 mg by mouth daily as needed.   ELIQUIS 5 MG TABS tablet Take 5 mg by mouth 2 (two) times daily.   gabapentin (NEURONTIN) 300 MG capsule 300 mg orally on day 1, 300 mg twice a day on day 2, and 300 mg 3 times a day on day 3 and until control of symptoms   gabapentin (NEURONTIN) 600 MG tablet Take 600 mg by mouth 3 (three) times daily.   hydrALAZINE (APRESOLINE) 50 MG tablet Take 50 mg by mouth 2 (two) times daily.    HYDROcodone-acetaminophen (NORCO/VICODIN) 5-325 MG tablet Take 1-2 tablets by mouth every 4 (four) hours as needed.   metFORMIN (GLUCOPHAGE) 1000 MG tablet Take 1,000 mg by mouth 2 (two) times daily.   metFORMIN (GLUCOPHAGE) 500 MG tablet Take by mouth 2 (two) times daily with a meal.   methylPREDNISolone (MEDROL) 4 MG TBPK tablet As prescribed   metoprolol succinate (TOPROL-XL) 100 MG 24 hr tablet Take 100 mg by mouth daily.   omeprazole (PRILOSEC) 20 MG capsule Take 1 capsule by mouth daily.   oxyCODONE-acetaminophen (PERCOCET/ROXICET) 5-325 MG tablet Take 1 tablet by mouth every 6 (six) hours as needed for up to 8 doses for severe pain.   predniSONE (DELTASONE) 10 MG tablet Take 2 tablets (20 mg total) by mouth daily.   traMADol (ULTRAM) 50 MG tablet Take 1 tablet by mouth 2 (two) times daily.   triamterene-hydrochlorothiazide (MAXZIDE) 75-50 MG per tablet Take 1 tablet by mouth daily.   Vitamin D, Ergocalciferol, (DRISDOL) 1.25 MG (50000 UNIT) CAPS capsule Take 50,000 Units by mouth once a week.   warfarin (COUMADIN) 7.5 MG tablet Take 5-7.5 mg by mouth daily. Alternates with 7.5 and 5 every other day.   No facility-administered encounter medications on file as of 01/13/2022.     Past Medical History:  Diagnosis  Date   Diabetes mellitus without complication ([place])    DVT (deep venous thrombosis) ([place])    Hypertension    Shingles     Past Surgical History:  Procedure Laterality Date   BREAST SURGERY     CHOLECYSTECTOMY      Family History  Problem Relation Age of Onset   Breast cancer Neg Hx     Social History   Social History Narrative   Not on file     Review of Systems General: Denies fevers, chills, weight loss CV: Denies chest pain, shortness of breath, palpitations  Physical Exam Vitals with BMI 01/13/2022 10/03/2021 10/03/2021  Height 5\' 6"  - -  Weight 206 lbs - -  BMI 16.07 - -  Systolic [phone]  Diastolic 79 54 67  Pulse 82 32 58    General:  No acute  distress,  Alert and oriented, Non-Toxic, Normal speech and affect Breast: She has grade 3 ptosis.  Inferior scar on the right side from prior lumpectomy.  Radiation changes do not seem too bad.  Base width is 13 cm.  Assessment/Plan I had a long discussion with [place] about her options.  She seems very interested in reconstruction.  She is worried about how she would feel without some form of reconstruction.  I did explain to her that she has risk factors that include history of radiation, diabetes, and being on blood thinners for DVT.  She is not interested in autologous reconstruction.  We discussed tissue expander placement at the timing of her mastectomy.  We reviewed risks that include bleeding, infection, damage to surrounding structures need for additional procedures.  I did explain that wound healing complications or infection could result in loss of the expander or subsequently placed implant.  I explained that at some point postoperatively the expander would be transition to gel implant.  I did explain to her that implants are not lifetime devices and ultimately can rupture or generate other procedures down the line.  I explained that the left-sided reduction would be best done at the timing of the second stage of her reconstruction.  I did explain the challenges and matching a reconstructive side to a natural side but that I thought with a left-sided reduction it would be reasonably close.  She wants very much to proceed with immediate reconstruction at this point.  We will plan to coordinate with Dr. Ninfa Linden and be available to help with that.  All of her questions were answered.  Cindra Presume 01/13/2022, 4:42 PM

## 2022-01-14 ENCOUNTER — Encounter: Payer: Self-pay | Admitting: *Deleted

## 2022-01-14 ENCOUNTER — Other Ambulatory Visit: Payer: Self-pay | Admitting: Surgery

## 2022-01-14 NOTE — Progress Notes (Signed)
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 She had similar diagnosis approx 15y ago and was seen by Dr Marin Olp. She has not seen him since 2009.   She has already seen the surgeon and is planning a mastectomy.   Called and spoke to patient.  She was confused because the surgeon told her no pills/chemo/radiation with a mastectomy. I explained the purpose of the medical oncologist, but she felt pretty strongly that she doesn't need to see medical oncology.  She agreed for me to follow and reach out after her surgical path was back and she could decide at that time whether or not she wanted to make an appointment with [place].   Oncology Nurse Navigator Documentation  Oncology Nurse Navigator Flowsheets 01/14/2022  Abnormal Finding Date 11/11/2021  Confirmed Diagnosis Date 12/22/2021  Diagnosis Status Confirmed Diagnosis Complete  Planned Course of Treatment Surgery  Navigator Follow Up Date: 01/19/2022  Navigator Follow Up Reason: Other:  Navigator Location [facility]  Referral Date to RadOnc/MedOnc 01/14/2022  Navigator Encounter Type Introductory Phone Call  Patient Visit Type MedOnc  Treatment Phase Pre-Tx/Tx Discussion  Barriers/Navigation Needs Coordination of Care;Education  Interventions Education  Acuity Level 1-No Barriers  Education Method Verbal  Time Spent with Patient 30

## 2022-01-19 ENCOUNTER — Ambulatory Visit
Admission: RE | Admit: 2022-01-19 | Discharge: 2022-01-19 | Disposition: A | Discharge status: Home / Self Care | Payer: Medicare Other | Source: Ambulatory Visit | Attending: Surgery | Admitting: Surgery

## 2022-01-19 ENCOUNTER — Other Ambulatory Visit: Payer: Self-pay

## 2022-01-19 DIAGNOSIS — D0511 Intraductal carcinoma in situ of right breast: Secondary | ICD-10-CM

## 2022-01-19 MED ORDER — GADOBUTROL 1 MMOL/ML IV SOLN
10.0000 mL | Freq: Once | INTRAVENOUS | Status: AC | PRN
  Administered 2022-01-19: 10 mL via INTRAVENOUS

## 2022-01-20 ENCOUNTER — Other Ambulatory Visit: Payer: Self-pay | Admitting: Surgery

## 2022-01-20 DIAGNOSIS — D0511 Intraductal carcinoma in situ of right breast: Secondary | ICD-10-CM

## 2022-01-24 ENCOUNTER — Inpatient Hospital Stay: Admission: RE | Admit: 2022-01-24 | Payer: Medicare Other | Source: Ambulatory Visit

## 2022-01-28 ENCOUNTER — Other Ambulatory Visit: Payer: Self-pay

## 2022-01-28 ENCOUNTER — Ambulatory Visit: Admission: RE | Admit: 2022-01-28 | Payer: Medicare Other | Source: Ambulatory Visit

## 2022-01-31 ENCOUNTER — Other Ambulatory Visit: Payer: Self-pay | Admitting: Surgery

## 2022-03-02 ENCOUNTER — Encounter: Payer: Medicare Other | Admitting: Physician Assistant

## 2022-03-09 ENCOUNTER — Ambulatory Visit (INDEPENDENT_AMBULATORY_CARE_PROVIDER_SITE_OTHER): Payer: Medicare Other | Admitting: Physician Assistant

## 2022-03-09 ENCOUNTER — Other Ambulatory Visit: Payer: Self-pay

## 2022-03-09 VITALS — BP 148/87 | HR 56 | Ht 66.0 in | Wt 208.4 lb

## 2022-03-09 DIAGNOSIS — C50919 Malignant neoplasm of unspecified site of unspecified female breast: Secondary | ICD-10-CM

## 2022-03-09 DIAGNOSIS — I82409 Acute embolism and thrombosis of unspecified deep veins of unspecified lower extremity: Secondary | ICD-10-CM

## 2022-03-09 MED ORDER — SULFAMETHOXAZOLE-TRIMETHOPRIM 800-160 MG PO TABS: 1.0000 | ORAL_TABLET | Freq: Two times a day (BID) | ORAL | 0 refills | Status: AC

## 2022-03-09 MED ORDER — ONDANSETRON 4 MG PO TBDP: 4.0000 mg | ORAL_TABLET | Freq: Three times a day (TID) | ORAL | 0 refills | Status: DC | PRN

## 2022-03-09 MED ORDER — HYDROCODONE-ACETAMINOPHEN 5-325 MG PO TABS: 1.0000 | ORAL_TABLET | Freq: Four times a day (QID) | ORAL | 0 refills | Status: AC | PRN

## 2022-03-09 NOTE — Progress Notes (Signed)
? ?  Patient ID: Brandi Roberts, female    DOB: February 09, 1956, 66 y.o.   MRN: [id number] ? ?Chief Complaint  ?Patient presents with  ? Pre-op Exam  ? ? ?  ICD-10-CM   ?1. Malignant neoplasm of female breast, unspecified estrogen receptor status, unspecified laterality, unspecified site of breast ([place])  C50.919   ?  ? ? ? ?History of Present Illness: ?Brandi Roberts is a 66 y.o.  female  with a history of breast cancer and breast conservation therapy for right breast that has now developed recurrence.  She presents for preoperative evaluation for upcoming procedure, right total mastectomy with immediate reconstruction using tissue expander and Flex HD, scheduled for 03/22/2022 with Dr. Claudia Desanctis and Dr. Ninfa Linden. ? ?The patient has not had problems with anesthesia.  She is accompanied by her son and daughter-in-law.  They live in [place], [place].  Daughter-in-law is a Therapist, sports.  She Dors a 15-pack-year smoking history, stopped 5 years ago.  She cannot recall her most recent A1c, but states that her sugars are well controlled.  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  No other family history of blood clots or clotting disorder.  She is not on any hormonal therapy.  Defer medical clearance and management of her anticoagulation to general surgery team.  Plan is to hold Eliquis 2 days prior to surgery and resumed after surgery. ? ?Summary of Previous Visit: Patient was seen for consult by Dr. Claudia Desanctis on 01/13/2022.  At that time, expressed interest in reconstruction after her planned right-sided unilateral mastectomy.  She is diabetic and also takes Eliquis for personal history of lower extremity DVT.  She has had radiation and chemotherapy in the past.  She also expressed interest in left-sided breast reduction for symmetry at time of implant exchange.  Plan was to coordinate with Dr. Ninfa Linden for joint surgery. ? ?Job: Retired. ? ?PMH Significant for: Type II DM, HTN, HLD,  lower extremity DVT on Eliquis, GERD. ? ? ?Past Medical History: ?Allergies: ?Allergies  ?Allergen Reactions  ? Levaquin [Levofloxacin] Nausea And Vomiting and Other (See Comments)  ?  dizziness  ? ? ?Current Medications: ? ?Current Outpatient Medications:  ?  albuterol (VENTOLIN HFA) 108 (90 Base) MCG/ACT inhaler, Inhale 2 puffs into the lungs every 6 (six) hours as needed., Disp: , Rfl:  ?  ALPRAZolam (XANAX) 0.5 MG tablet, Take 0.5 mg by mouth 2 (two) times daily as needed., Disp: , Rfl:  ?  amLODipine (NORVASC) 10 MG tablet, Take 10 mg by mouth daily., Disp: , Rfl:  ?  atorvastatin (LIPITOR) 10 MG tablet, Take 5 mg by mouth at bedtime., Disp: , Rfl:  ?  benazepril (LOTENSIN) 40 MG tablet, Take 40 mg by mouth daily., Disp: , Rfl:  ?  doxazosin (CARDURA) 2 MG tablet, Take 2 mg by mouth daily as needed., Disp: , Rfl:  ?  ELIQUIS 5 MG TABS tablet, Take 5 mg by mouth 2 (two) times daily., Disp: , Rfl:  ?  gabapentin (NEURONTIN) 300 MG capsule, 300 mg orally on day 1, 300 mg twice a day on day 2, and 300 mg 3 times a day on day 3 and until control of symptoms, Disp: 15 capsule, Rfl: 0 ?  gabapentin (NEURONTIN) 600 MG tablet, Take 600 mg by mouth 3 (three) times daily., Disp: , Rfl:  ?  hydrALAZINE (APRESOLINE) 50 MG tablet, Take 50 mg by mouth 2 (two) times  daily., Disp: , Rfl:  ?  HYDROcodone-acetaminophen (NORCO/VICODIN) 5-325 MG tablet, Take 1-2 tablets by mouth every 4 (four) hours as needed., Disp: 20 tablet, Rfl: 0 ?  metFORMIN (GLUCOPHAGE) 1000 MG tablet, Take 1,000 mg by mouth 2 (two) times daily., Disp: , Rfl:  ?  metFORMIN (GLUCOPHAGE) 500 MG tablet, Take by mouth 2 (two) times daily with a meal., Disp: , Rfl:  ?  methylPREDNISolone (MEDROL) 4 MG TBPK tablet, As prescribed, Disp: 21 tablet, Rfl: 0 ?  metoprolol succinate (TOPROL-XL) 100 MG 24 hr tablet, Take 100 mg by mouth daily., Disp: , Rfl:  ?  omeprazole (PRILOSEC) 20 MG capsule, Take 1 capsule by mouth daily., Disp: , Rfl:  ?  oxyCODONE-acetaminophen  (PERCOCET/ROXICET) 5-325 MG tablet, Take 1 tablet by mouth every 6 (six) hours as needed for up to 8 doses for severe pain., Disp: 8 tablet, Rfl: 0 ?  predniSONE (DELTASONE) 10 MG tablet, Take 2 tablets (20 mg total) by mouth daily., Disp: 15 tablet, Rfl: 0 ?  traMADol (ULTRAM) 50 MG tablet, Take 1 tablet by mouth 2 (two) times daily., Disp: , Rfl:  ?  triamterene-hydrochlorothiazide (MAXZIDE) 75-50 MG per tablet, Take 1 tablet by mouth daily., Disp: , Rfl:  ?  Vitamin D, Ergocalciferol, (DRISDOL) 1.25 MG (50000 UNIT) CAPS capsule, Take 50,000 Units by mouth once a week., Disp: , Rfl:  ?  warfarin (COUMADIN) 7.5 MG tablet, Take 5-7.5 mg by mouth daily. Alternates with 7.5 and 5 every other day., Disp: , Rfl:  ? ?Past Medical Problems: ?Past Medical History:  ?Diagnosis Date  ? Diabetes mellitus without complication ([place])   ? DVT (deep venous thrombosis) ([place])   ? Hypertension   ? Shingles   ? ? ?Past Surgical History: ?Past Surgical History:  ?Procedure Laterality Date  ? BREAST SURGERY    ? CHOLECYSTECTOMY    ? ? ?Social History: ?Social History  ? ?Socioeconomic History  ? Marital status: Married  ?  Spouse name: Not on file  ? Number of children: Not on file  ? Years of education: Not on file  ? Highest education level: Not on file  ?Occupational History  ? Not on file  ?Tobacco Use  ? Smoking status: Every Day  ?  Packs/day: 0.50  ?  Years: 15.00  ?  Pack years: 7.50  ?  Types: Cigarettes  ? Smokeless tobacco: Never  ?Substance and Sexual Activity  ? Alcohol use: No  ? Drug use: No  ? Sexual activity: Not on file  ?Other Topics Concern  ? Not on file  ?Social History Narrative  ? Not on file  ? ?Social Determinants of Health  ? ?Financial Resource Strain: Not on file  ?Food Insecurity: Not on file  ?Transportation Needs: Not on file  ?Physical Activity: Not on file  ?Stress: Not on file  ?Social Connections: Not on file  ?Intimate Partner Violence: Not on file  ? ? ?Family History: ?Family History  ?Problem  Relation Age of Onset  ? Breast cancer Neg Hx   ? ? ?Review of Systems: ?ROS ?Denies recent fevers or hospitalizations ? ?Physical Exam: ?Vital Signs ?BP (!) 148/87 (BP Location: Left Arm, Patient Position: Sitting, Cuff Size: Large)   Pulse (!) 56   Ht '5\' 6"'$  (1.676 m)   Wt 208 lb 6.4 oz (94.5 kg)   SpO2 96%   BMI 33.64 kg/m?  ? ?Physical Exam ?Constitutional:   ?   General: Not in acute distress. ?   Appearance:  Normal appearance. Not ill-appearing.  ?HENT:  ?   Head: Normocephalic and atraumatic.  ?Eyes:  ?   Pupils: Pupils are equal, round. ?Cardiovascular:  ?   Rate and Rhythm: Normal rate. ?   Pulses: Normal pulses.  ?Pulmonary:  ?   Effort: No respiratory distress or increased work of breathing.  Speaks in full sentences. ?Abdominal:  ?   General: Abdomen is flat. No distension.   ?Musculoskeletal: Normal range of motion. No lower extremity swelling or edema.  Scattered spider veins, but no varicosities. ?Skin: ?   General: Skin is warm and dry.  ?   Findings: No erythema or rash.  ?Neurological:  ?   Mental Status: Alert and oriented to person, place, and time.  ?Psychiatric:     ?   Mood and Affect: Mood normal.     ?   Behavior: Behavior normal.  ? ? ?Assessment/Plan: ?The patient is scheduled for right-sided total mastectomy with immediate reconstruction with Dr. Claudia Desanctis.  Risks, benefits, and alternatives of procedure discussed, questions answered and consent obtained.   ? ?Smoking Status: Non-smoker; patient quit 5 years ago, 15-pack-year smoking history.  She does not plan to resume smoking anytime soon. ?Last Mammogram: 12/2021; Results: No evidence of malignancy left breast.  Biopsy-proven DCIS right breast. ? ?Caprini Score: 7; Risk Factors include: Age, BMI greater than 25, breast cancer, personal history of DVT, and length of planned surgery. Recommendation for mechanical prophylaxis. Encourage early ambulation.  She will also resume her Eliquis day after surgery. ? ?Pictures obtained:  Today. ? ?Post-op Rx sent to pharmacy: Norco, Zofran, Bactrim. ? ?Patient was provided with the General Surgical Risk consent document and Pain Medication Agreement prior to their appointment.  They had adequate time to rea

## 2022-03-11 ENCOUNTER — Other Ambulatory Visit: Payer: Self-pay

## 2022-03-11 ENCOUNTER — Encounter (HOSPITAL_BASED_OUTPATIENT_CLINIC_OR_DEPARTMENT_OTHER): Payer: Self-pay | Admitting: Surgery

## 2022-03-15 ENCOUNTER — Other Ambulatory Visit: Payer: Self-pay | Admitting: Surgery

## 2022-03-15 ENCOUNTER — Encounter (HOSPITAL_BASED_OUTPATIENT_CLINIC_OR_DEPARTMENT_OTHER)
Admission: RE | Admit: 2022-03-15 | Discharge: 2022-03-15 | Disposition: A | Discharge status: Home / Self Care | Payer: Medicare Other | Source: Ambulatory Visit | Attending: Surgery | Admitting: Surgery

## 2022-03-15 DIAGNOSIS — I10 Essential (primary) hypertension: Secondary | ICD-10-CM | POA: Diagnosis not present

## 2022-03-15 DIAGNOSIS — Z01812 Encounter for preprocedural laboratory examination: Secondary | ICD-10-CM | POA: Insufficient documentation

## 2022-03-15 LAB — BASIC METABOLIC PANEL
Anion gap: 9 (ref 5–15)
BUN: 23 mg/dL (ref 8–23)
CO2: 25 mmol/L (ref 22–32)
Calcium: 9.4 mg/dL (ref 8.9–10.3)
Chloride: 105 mmol/L (ref 98–111)
Creatinine, Ser: 1.44 mg/dL — ABNORMAL HIGH (ref 0.44–1.00)
GFR, Estimated: 40 mL/min — ABNORMAL LOW (ref >60)
Glucose, Bld: 108 mg/dL — ABNORMAL HIGH (ref 70–99)
Potassium: 4.4 mmol/L (ref 3.5–5.1)
Sodium: 139 mmol/L (ref 135–145)

## 2022-03-15 NOTE — Progress Notes (Signed)

## 2022-03-16 ENCOUNTER — Ambulatory Visit (INDEPENDENT_AMBULATORY_CARE_PROVIDER_SITE_OTHER): Payer: Medicare Other | Admitting: Plastic Surgery

## 2022-03-16 ENCOUNTER — Telehealth: Payer: Self-pay | Admitting: Plastic Surgery

## 2022-03-16 DIAGNOSIS — C50919 Malignant neoplasm of unspecified site of unspecified female breast: Secondary | ICD-10-CM

## 2022-03-16 NOTE — Progress Notes (Signed)
Spoke with patient on the phone today.  She has decided she would rather not undergo reconstruction after her right mastectomy.  After thinking about the follow-up and the complexity and the need for additional surgeries she is chosen not to have reconstruction at this point.  We talked about whether other options would be and she is content to move forward with mastectomy alone and will let us know if anything else comes up that we can do for her.  I will make sure to pass this along to Dr. Ninfa Linden.  All of her questions were answered. ?

## 2022-03-16 NOTE — Telephone Encounter (Signed)
[facility] is needing orders put in for the pts surgery. ?

## 2022-03-21 NOTE — H&P (Signed)
?PROVIDER: Beverlee Nims, MD ? ?MRN: [id number] ?DOB: 13-Oct-1956 ?DATE OF ENCOUNTER: 03/15/2022 ?Subjective  ? ?Chief Complaint: Follow-up (Discuss review images ) ? ? ?History of Present Illness: ?Brandi Roberts is a 66 y.o. female who is seen today for a preoperative visit regarding her right breast ductal carcinoma in situ. Again, she has multifocal DCIS of the right breast and given the large size in multiple areas, mastectomy has been recommended. She has seen plastic surgery preoperatively and initially has been scheduled for immediate reconstruction but she is still deciding whether she will go through that part of the procedure. The mastectomy and reconstruction is scheduled for next week. She has otherwise been doing very well. ? ? ? ?Review of Systems: ?A complete review of systems was obtained from the patient. I have reviewed this information and discussed as appropriate with the patient. See HPI as well for other ROS. ? ?ROS  ? ?Medical History: ?Past Medical History:  ?Diagnosis Date  ? Diabetes mellitus without complication (CMS-HCC)  ? DVT (deep venous thrombosis) (CMS-HCC)  ? History of cancer  ? Hypertension  ? Thyroid disease  ? ?There is no problem list on file for this patient. ? ?History reviewed. No pertinent surgical history.  ? ?Allergies  ?Allergen Reactions  ? Levofloxacin Nausea And Vomiting and Other (See Comments)  ?dizziness  ? ?Current Outpatient Medications on File Prior to Visit  ?Medication Sig Dispense Refill  ? amLODIPine (NORVASC) 10 MG tablet Take 10 mg by mouth once daily  ? apixaban (ELIQUIS) 5 mg tablet Take 5 mg by mouth 2 (two) times daily  ? atorvastatin (LIPITOR) 10 MG tablet Take 5 mg by mouth at bedtime  ? benazepriL (LOTENSIN) 40 MG tablet Take 40 mg by mouth once daily  ? doxazosin (CARDURA) 2 MG tablet Take by mouth  ? ergocalciferol, vitamin D2, 1,250 mcg (50,000 unit) capsule  ? hydrALAZINE (APRESOLINE) 50 MG tablet Take 50 mg by mouth 2 (two) times daily   ? metFORMIN (GLUCOPHAGE) 1000 MG tablet Take 1,000 mg by mouth 2 (two) times daily  ? metoprolol succinate (TOPROL-XL) 100 MG XL tablet Take 100 mg by mouth once daily  ? triamterene-hydrochlorothiazide (MAXZIDE) 75-50 mg tablet  ? ?No current facility-administered medications on file prior to visit.  ? ?History reviewed. No pertinent family history.  ? ?Social History  ? ?Tobacco Use  ?Smoking Status Never  ?Smokeless Tobacco Never  ? ? ?Social History  ? ?Socioeconomic History  ? Marital status: Married  ?Tobacco Use  ? Smoking status: Never  ? Smokeless tobacco: Never  ?Vaping Use  ? Vaping Use: Never used  ?Substance and Sexual Activity  ? Alcohol use: Never  ? Drug use: Never  ? ?Objective:  ? ?There were no vitals filed for this visit.  ?There is no height or weight on file to calculate BMI. ? ?Physical Exam  ? ?She looks well on exam today ? ?We deferred a breast exam ? ?Labs, Imaging and Diagnostic Testing: ? ?I reviewed her MRI and mammograms again as well as her pathology results ? ?Assessment and Plan:  ? ?Diagnoses and all orders for this visit: ? ?Ductal carcinoma in situ (DCIS) of right breast ? ? ? ?The plan will be to proceed with a right breast total mastectomy next week. I did discuss injecting mag trace underneath the nipple to map the lymph nodes in case invasive cancer is found on the final pathology. I explained the reasonings for this with her and  her family. ?She again will be discussing the reconstruction part of the procedure with Dr. Claudia Desanctis on Wednesday and will make her ultimate decision whether or not she will proceed with reconstruction at the time of the mastectomy. ?As far as the mastectomy part goes, we again discussed the risks which includes but is not limited to bleeding, infection, the need for drain placement, cardiopulmonary issues, the need for further procedures if invasive cancer is found, postoperative recovery, etc.  ? ?Addendum: She has decided to forego immediate  reconstruction so we will just be proceeding with a right mastectomy.  Mag trace will be injected into the nipple areolar complex ? ?

## 2022-03-22 ENCOUNTER — Encounter: Payer: Self-pay | Admitting: *Deleted

## 2022-03-22 ENCOUNTER — Encounter (HOSPITAL_BASED_OUTPATIENT_CLINIC_OR_DEPARTMENT_OTHER)
Admission: RE | Disposition: A | Discharge status: Home / Self Care | Payer: Self-pay | Source: Home / Self Care | Attending: Surgery

## 2022-03-22 ENCOUNTER — Observation Stay (HOSPITAL_BASED_OUTPATIENT_CLINIC_OR_DEPARTMENT_OTHER)
Admission: RE | Admit: 2022-03-22 | Discharge: 2022-03-23 | Disposition: A | Discharge status: Home / Self Care | Payer: Medicare Other | Attending: Surgery | Admitting: Surgery

## 2022-03-22 ENCOUNTER — Encounter (HOSPITAL_BASED_OUTPATIENT_CLINIC_OR_DEPARTMENT_OTHER): Payer: Self-pay | Admitting: Surgery

## 2022-03-22 ENCOUNTER — Other Ambulatory Visit: Payer: Self-pay

## 2022-03-22 ENCOUNTER — Ambulatory Visit (HOSPITAL_BASED_OUTPATIENT_CLINIC_OR_DEPARTMENT_OTHER): Payer: Medicare Other | Admitting: Certified Registered Nurse Anesthetist

## 2022-03-22 DIAGNOSIS — I10 Essential (primary) hypertension: Secondary | ICD-10-CM

## 2022-03-22 DIAGNOSIS — Z7984 Long term (current) use of oral hypoglycemic drugs: Secondary | ICD-10-CM | POA: Insufficient documentation

## 2022-03-22 DIAGNOSIS — Z79899 Other long term (current) drug therapy: Secondary | ICD-10-CM | POA: Diagnosis not present

## 2022-03-22 DIAGNOSIS — Z9011 Acquired absence of right breast and nipple: Secondary | ICD-10-CM

## 2022-03-22 DIAGNOSIS — E119 Type 2 diabetes mellitus without complications: Secondary | ICD-10-CM

## 2022-03-22 DIAGNOSIS — C50911 Malignant neoplasm of unspecified site of right female breast: Principal | ICD-10-CM | POA: Insufficient documentation

## 2022-03-22 DIAGNOSIS — L821 Other seborrheic keratosis: Secondary | ICD-10-CM | POA: Insufficient documentation

## 2022-03-22 DIAGNOSIS — N6489 Other specified disorders of breast: Secondary | ICD-10-CM | POA: Insufficient documentation

## 2022-03-22 DIAGNOSIS — D0511 Intraductal carcinoma in situ of right breast: Secondary | ICD-10-CM | POA: Diagnosis present

## 2022-03-22 DIAGNOSIS — Z8673 Personal history of transient ischemic attack (TIA), and cerebral infarction without residual deficits: Secondary | ICD-10-CM | POA: Insufficient documentation

## 2022-03-22 DIAGNOSIS — I82409 Acute embolism and thrombosis of unspecified deep veins of unspecified lower extremity: Secondary | ICD-10-CM | POA: Diagnosis not present

## 2022-03-22 DIAGNOSIS — Z7901 Long term (current) use of anticoagulants: Secondary | ICD-10-CM | POA: Insufficient documentation

## 2022-03-22 DIAGNOSIS — N641 Fat necrosis of breast: Secondary | ICD-10-CM | POA: Insufficient documentation

## 2022-03-22 HISTORY — DX: Unspecified asthma, uncomplicated: J45.909

## 2022-03-22 HISTORY — DX: Polyneuropathy, unspecified: G62.9

## 2022-03-22 HISTORY — PX: TOTAL MASTECTOMY: SHX6129

## 2022-03-22 HISTORY — DX: Gastro-esophageal reflux disease without esophagitis: K21.9

## 2022-03-22 LAB — GLUCOSE, CAPILLARY
Glucose-Capillary: 142 mg/dL — ABNORMAL HIGH (ref 70–99)
Glucose-Capillary: 172 mg/dL — ABNORMAL HIGH (ref 70–99)

## 2022-03-22 SURGERY — MASTECTOMY, SIMPLE
Anesthesia: General | Site: Breast | Laterality: Right

## 2022-03-22 MED ORDER — DIPHENHYDRAMINE HCL 12.5 MG/5ML PO ELIX: 12.5000 mg | ORAL_SOLUTION | Freq: Four times a day (QID) | ORAL | Status: DC | PRN

## 2022-03-22 MED ORDER — EPHEDRINE SULFATE-NACL 50-0.9 MG/10ML-% IV SOSY
PREFILLED_SYRINGE | INTRAVENOUS | Status: DC | PRN
  Administered 2022-03-22 (×4): 5 mg via INTRAVENOUS

## 2022-03-22 MED ORDER — ACETAMINOPHEN 500 MG PO TABS
ORAL_TABLET | ORAL | Status: AC
  Filled 2022-03-22: qty 2

## 2022-03-22 MED ORDER — CHLORHEXIDINE GLUCONATE CLOTH 2 % EX PADS: 6.0000 | MEDICATED_PAD | Freq: Once | CUTANEOUS | Status: DC

## 2022-03-22 MED ORDER — POTASSIUM CHLORIDE IN NACL 20-0.9 MEQ/L-% IV SOLN
INTRAVENOUS | Status: DC
  Filled 2022-03-22: qty 1000

## 2022-03-22 MED ORDER — HYDROMORPHONE HCL 1 MG/ML IJ SOLN
INTRAMUSCULAR | Status: AC
  Filled 2022-03-22: qty 0.5

## 2022-03-22 MED ORDER — METHOCARBAMOL 500 MG PO TABS
500.0000 mg | ORAL_TABLET | Freq: Four times a day (QID) | ORAL | Status: DC | PRN
  Administered 2022-03-22 (×2): 500 mg via ORAL
  Filled 2022-03-22 (×2): qty 1

## 2022-03-22 MED ORDER — DIPHENHYDRAMINE HCL 50 MG/ML IJ SOLN: 12.5000 mg | Freq: Four times a day (QID) | INTRAMUSCULAR | Status: DC | PRN

## 2022-03-22 MED ORDER — PROPOFOL 10 MG/ML IV BOLUS
INTRAVENOUS | Status: DC | PRN
  Administered 2022-03-22: 150 mg via INTRAVENOUS

## 2022-03-22 MED ORDER — HYDRALAZINE HCL 50 MG PO TABS
50.0000 mg | ORAL_TABLET | Freq: Two times a day (BID) | ORAL | Status: DC
  Filled 2022-03-22: qty 1

## 2022-03-22 MED ORDER — ACETAMINOPHEN 500 MG PO TABS
1000.0000 mg | ORAL_TABLET | Freq: Four times a day (QID) | ORAL | Status: DC
  Administered 2022-03-22 – 2022-03-23 (×3): 1000 mg via ORAL
  Filled 2022-03-22 (×3): qty 2

## 2022-03-22 MED ORDER — ALPRAZOLAM 0.25 MG PO TABS: 0.5000 mg | ORAL_TABLET | Freq: Two times a day (BID) | ORAL | Status: DC | PRN

## 2022-03-22 MED ORDER — LACTATED RINGERS IV SOLN: INTRAVENOUS | Status: DC

## 2022-03-22 MED ORDER — LIDOCAINE 2% (20 MG/ML) 5 ML SYRINGE
INTRAMUSCULAR | Status: AC
  Filled 2022-03-22: qty 5

## 2022-03-22 MED ORDER — 0.9 % SODIUM CHLORIDE (POUR BTL) OPTIME
TOPICAL | Status: DC | PRN
  Administered 2022-03-22: 300 mL

## 2022-03-22 MED ORDER — LIDOCAINE 2% (20 MG/ML) 5 ML SYRINGE
INTRAMUSCULAR | Status: DC | PRN
  Administered 2022-03-22: 40 mg via INTRAVENOUS

## 2022-03-22 MED ORDER — FENTANYL CITRATE (PF) 100 MCG/2ML IJ SOLN
INTRAMUSCULAR | Status: AC
  Filled 2022-03-22: qty 2

## 2022-03-22 MED ORDER — DEXAMETHASONE SODIUM PHOSPHATE 10 MG/ML IJ SOLN
INTRAMUSCULAR | Status: DC | PRN
  Administered 2022-03-22: 5 mg via INTRAVENOUS

## 2022-03-22 MED ORDER — OXYCODONE HCL 5 MG/5ML PO SOLN: 5.0000 mg | Freq: Once | ORAL | Status: DC | PRN

## 2022-03-22 MED ORDER — BUPIVACAINE-EPINEPHRINE (PF) 0.5% -1:200000 IJ SOLN
INTRAMUSCULAR | Status: DC | PRN
  Administered 2022-03-22: 30 mL

## 2022-03-22 MED ORDER — TRIAMTERENE-HCTZ 75-50 MG PO TABS
1.0000 | ORAL_TABLET | Freq: Every day | ORAL | Status: DC
  Filled 2022-03-22: qty 1

## 2022-03-22 MED ORDER — ALBUTEROL SULFATE HFA 108 (90 BASE) MCG/ACT IN AERS: 2.0000 | INHALATION_SPRAY | Freq: Four times a day (QID) | RESPIRATORY_TRACT | Status: DC | PRN

## 2022-03-22 MED ORDER — MIDAZOLAM HCL 2 MG/2ML IJ SOLN
INTRAMUSCULAR | Status: AC
  Filled 2022-03-22: qty 2

## 2022-03-22 MED ORDER — PANTOPRAZOLE SODIUM 40 MG PO TBEC
40.0000 mg | DELAYED_RELEASE_TABLET | Freq: Every day | ORAL | Status: DC
  Administered 2022-03-22: 40 mg via ORAL
  Filled 2022-03-22: qty 1

## 2022-03-22 MED ORDER — ONDANSETRON 4 MG PO TBDP: 4.0000 mg | ORAL_TABLET | Freq: Four times a day (QID) | ORAL | Status: DC | PRN

## 2022-03-22 MED ORDER — FENTANYL CITRATE (PF) 100 MCG/2ML IJ SOLN
100.0000 ug | Freq: Once | INTRAMUSCULAR | Status: AC
  Administered 2022-03-22: 100 ug via INTRAVENOUS

## 2022-03-22 MED ORDER — FENTANYL CITRATE (PF) 100 MCG/2ML IJ SOLN
INTRAMUSCULAR | Status: DC | PRN
  Administered 2022-03-22 (×3): 25 ug via INTRAVENOUS

## 2022-03-22 MED ORDER — CEFAZOLIN SODIUM-DEXTROSE 2-4 GM/100ML-% IV SOLN
INTRAVENOUS | Status: AC
  Filled 2022-03-22: qty 100

## 2022-03-22 MED ORDER — ONDANSETRON HCL 4 MG/2ML IJ SOLN
INTRAMUSCULAR | Status: DC | PRN
Start: 2022-03-22 — End: 2022-03-22
  Administered 2022-03-22: 4 mg via INTRAVENOUS

## 2022-03-22 MED ORDER — ENOXAPARIN SODIUM 40 MG/0.4ML IJ SOSY: 40.0000 mg | PREFILLED_SYRINGE | INTRAMUSCULAR | Status: DC

## 2022-03-22 MED ORDER — ONDANSETRON HCL 4 MG/2ML IJ SOLN: 4.0000 mg | Freq: Four times a day (QID) | INTRAMUSCULAR | Status: DC | PRN

## 2022-03-22 MED ORDER — ATORVASTATIN CALCIUM 10 MG PO TABS
5.0000 mg | ORAL_TABLET | Freq: Every day | ORAL | Status: DC
Start: 2022-03-22 — End: 2022-03-23
  Filled 2022-03-22: qty 0.5

## 2022-03-22 MED ORDER — HYDROMORPHONE HCL 1 MG/ML IJ SOLN: 1.0000 mg | INTRAMUSCULAR | Status: DC | PRN

## 2022-03-22 MED ORDER — ACETAMINOPHEN 500 MG PO TABS
1000.0000 mg | ORAL_TABLET | ORAL | Status: AC
  Administered 2022-03-22: 1000 mg via ORAL

## 2022-03-22 MED ORDER — CEFAZOLIN SODIUM-DEXTROSE 2-4 GM/100ML-% IV SOLN
2.0000 g | INTRAVENOUS | Status: AC
  Administered 2022-03-22: 2 g via INTRAVENOUS

## 2022-03-22 MED ORDER — MEPERIDINE HCL 25 MG/ML IJ SOLN: 6.2500 mg | INTRAMUSCULAR | Status: DC | PRN

## 2022-03-22 MED ORDER — MIDAZOLAM HCL 2 MG/2ML IJ SOLN
1.0000 mg | Freq: Once | INTRAMUSCULAR | Status: AC
  Administered 2022-03-22: 1 mg via INTRAVENOUS

## 2022-03-22 MED ORDER — OXYCODONE HCL 5 MG PO TABS: 5.0000 mg | ORAL_TABLET | ORAL | Status: DC | PRN

## 2022-03-22 MED ORDER — EPHEDRINE 5 MG/ML INJ
INTRAVENOUS | Status: AC
  Filled 2022-03-22: qty 5

## 2022-03-22 MED ORDER — METOPROLOL SUCCINATE ER 100 MG PO TB24
100.0000 mg | ORAL_TABLET | Freq: Every day | ORAL | Status: DC
Start: 2022-03-22 — End: 2022-03-23
  Administered 2022-03-22: 100 mg via ORAL
  Filled 2022-03-22: qty 1

## 2022-03-22 MED ORDER — BENAZEPRIL HCL 40 MG PO TABS
40.0000 mg | ORAL_TABLET | Freq: Every day | ORAL | Status: DC
  Filled 2022-03-22: qty 1

## 2022-03-22 MED ORDER — TRAMADOL HCL 50 MG PO TABS
50.0000 mg | ORAL_TABLET | Freq: Four times a day (QID) | ORAL | Status: DC | PRN
  Administered 2022-03-22: 50 mg via ORAL
  Filled 2022-03-22: qty 1

## 2022-03-22 MED ORDER — OXYCODONE HCL 5 MG PO TABS: 5.0000 mg | ORAL_TABLET | Freq: Once | ORAL | Status: DC | PRN

## 2022-03-22 MED ORDER — GABAPENTIN 300 MG PO CAPS
300.0000 mg | ORAL_CAPSULE | Freq: Two times a day (BID) | ORAL | Status: DC
  Administered 2022-03-22 (×2): 300 mg via ORAL
  Filled 2022-03-22 (×2): qty 1

## 2022-03-22 MED ORDER — HYDROMORPHONE HCL 1 MG/ML IJ SOLN
0.2500 mg | INTRAMUSCULAR | Status: DC | PRN
  Administered 2022-03-22 (×4): 0.5 mg via INTRAVENOUS

## 2022-03-22 MED ORDER — MIDAZOLAM HCL 2 MG/2ML IJ SOLN: 0.5000 mg | Freq: Once | INTRAMUSCULAR | Status: DC | PRN

## 2022-03-22 SURGICAL SUPPLY — 41 items
ADH SKN CLS APL DERMABOND .7 (GAUZE/BANDAGES/DRESSINGS) ×1
APL PRP STRL LF DISP 70% ISPRP (MISCELLANEOUS) ×1
APPLIER CLIP 9.375 MED OPEN (MISCELLANEOUS) ×2
APR CLP MED 9.3 20 MLT OPN (MISCELLANEOUS) ×1
BINDER BREAST XXLRG (GAUZE/BANDAGES/DRESSINGS) ×1 IMPLANT
BLADE SURG 15 STRL LF DISP TIS (BLADE) ×1 IMPLANT
BLADE SURG 15 STRL SS (BLADE) ×2
CANISTER SUCT 1200ML W/VALVE (MISCELLANEOUS) ×2 IMPLANT
CHLORAPREP W/TINT 26 (MISCELLANEOUS) ×2 IMPLANT
CLIP APPLIE 9.375 MED OPEN (MISCELLANEOUS) ×1 IMPLANT
COVER BACK TABLE 60X90IN (DRAPES) ×2 IMPLANT
COVER MAYO STAND STRL (DRAPES) ×2 IMPLANT
DERMABOND ADVANCED (GAUZE/BANDAGES/DRESSINGS) ×1
DERMABOND ADVANCED .7 DNX12 (GAUZE/BANDAGES/DRESSINGS) ×1 IMPLANT
DRAIN CHANNEL 19F RND (DRAIN) ×2 IMPLANT
DRAPE LAPAROSCOPIC ABDOMINAL (DRAPES) ×2 IMPLANT
DRAPE UTILITY XL STRL (DRAPES) ×2 IMPLANT
ELECT REM PT RETURN 9FT ADLT (ELECTROSURGICAL) ×2
ELECTRODE REM PT RTRN 9FT ADLT (ELECTROSURGICAL) ×1 IMPLANT
EVACUATOR SILICONE 100CC (DRAIN) ×2 IMPLANT
GAUZE SPONGE 4X4 12PLY STRL LF (GAUZE/BANDAGES/DRESSINGS) IMPLANT
GLOVE SURG SIGNA 7.5 PF LTX (GLOVE) ×2 IMPLANT
GOWN STRL REUS W/ TWL LRG LVL3 (GOWN DISPOSABLE) ×1 IMPLANT
GOWN STRL REUS W/ TWL XL LVL3 (GOWN DISPOSABLE) ×1 IMPLANT
GOWN STRL REUS W/TWL LRG LVL3 (GOWN DISPOSABLE) ×2
GOWN STRL REUS W/TWL XL LVL3 (GOWN DISPOSABLE) ×2
NS IRRIG 1000ML POUR BTL (IV SOLUTION) ×2 IMPLANT
PACK BASIN DAY SURGERY FS (CUSTOM PROCEDURE TRAY) ×2 IMPLANT
PENCIL SMOKE EVACUATOR (MISCELLANEOUS) ×2 IMPLANT
PIN SAFETY STERILE (MISCELLANEOUS) ×2 IMPLANT
SLEEVE SCD COMPRESS KNEE MED (STOCKING) ×2 IMPLANT
SPONGE T-LAP 18X18 ~~LOC~~+RFID (SPONGE) ×3 IMPLANT
SUT ETHILON 3 0 PS 1 (SUTURE) ×2 IMPLANT
SUT MNCRL AB 4-0 PS2 18 (SUTURE) ×3 IMPLANT
SUT SILK 2 0 SH (SUTURE) ×1 IMPLANT
SUT VICRYL 3-0 CR8 SH (SUTURE) ×2 IMPLANT
SYR BULB EAR ULCER 3OZ GRN STR (SYRINGE) ×2 IMPLANT
SYR CONTROL 10ML LL (SYRINGE) ×2 IMPLANT
TOWEL GREEN STERILE FF (TOWEL DISPOSABLE) ×2 IMPLANT
TUBE CONNECTING 20X1/4 (TUBING) ×2 IMPLANT
YANKAUER SUCT BULB TIP NO VENT (SUCTIONS) ×2 IMPLANT

## 2022-03-22 NOTE — Anesthesia Postprocedure Evaluation (Signed)
Anesthesia Post Note ? ?Patient: Cherita Hebel ? ?Procedure(s) Performed: RIGHT TOTAL MASTECTOMY (Right: Breast) ? ?  ? ?Patient location during evaluation: Nursing Unit ?Anesthesia Type: General ?Level of consciousness: awake and alert, oriented and patient cooperative ?Pain management: pain level controlled ?Vital Signs Assessment: post-procedure vital signs reviewed and stable ?Respiratory status: spontaneous breathing, nonlabored ventilation and respiratory function stable ?Cardiovascular status: blood pressure returned to baseline and stable ?Postop Assessment: no apparent nausea or vomiting, adequate PO intake and able to ambulate ?Anesthetic complications: no ? ? ?No notable events documented. ? ?Last Vitals:  ?Vitals:  ? 03/22/22 1200 03/22/22 1220  ?BP: (!) 155/64 (!) 130/49  ?Pulse: 63 66  ?Resp: (!) 9 16  ?Temp:    ?SpO2: 98% 95%  ?  ?Last Pain:  ?Vitals:  ? 03/22/22 1220  ?TempSrc:   ?PainSc: 3   ? ? ?  ?  ?  ?  ?  ?  ? ?Kiwana Deblasi,E. Sadhana Frater ? ? ? ? ?

## 2022-03-22 NOTE — Anesthesia Preprocedure Evaluation (Addendum)
Anesthesia Evaluation  ?Patient identified by MRN, date of birth, ID band ?Patient awake ? ? ? ?Reviewed: ?Allergy & Precautions, NPO status , Patient's Chart, lab work & pertinent test results, reviewed documented beta blocker date and time  ? ?History of Anesthesia Complications ?Negative for: history of anesthetic complications ? ?Airway ?Mallampati: II ? ?TM Distance: >3 FB ?Neck ROM: Full ? ? ? Dental ? ?(+) Dental Advisory Given ?  ?Pulmonary ?COPD (needed inhaler this am),  COPD inhaler, former smoker,  ?  ?breath sounds clear to auscultation ? ? ? ? ? ? Cardiovascular ?hypertension, Pt. on medications and Pt. on home beta blockers ?(-) angina+ DVT  ? ?Rhythm:Regular Rate:Normal ? ? ?  ?Neuro/Psych ?negative neurological ROS ? negative psych ROS  ? GI/Hepatic ?Neg liver ROS, GERD  Medicated and Controlled,  ?Endo/Other  ?diabetes (glu 142), Oral Hypoglycemic AgentsMorbid obesity ? Renal/GU ?Renal InsufficiencyRenal disease  ? ?  ?Musculoskeletal ? ? Abdominal ?(+) + obese,   ?Peds ? Hematology ?Eliquis: last dose sat   ?Anesthesia Other Findings ? ? Reproductive/Obstetrics ? ?  ? ? ? ? ? ? ? ? ? ? ? ? ? ?  ?  ? ? ? ? ? ? ? ?Anesthesia Physical ?Anesthesia Plan ? ?ASA: 3 ? ?Anesthesia Plan: General  ? ?Post-op Pain Management: Tylenol PO (pre-op)* and Regional block*  ? ?Induction: Intravenous ? ?PONV Risk Score and Plan: 2 and Ondansetron and Dexamethasone ? ?Airway Management Planned: Oral ETT ? ?Additional Equipment: None ? ?Intra-op Plan:  ? ?Post-operative Plan: Extubation in OR ? ?Informed Consent: I have reviewed the patients History and Physical, chart, labs and discussed the procedure including the risks, benefits and alternatives for the proposed anesthesia with the patient or authorized representative who has indicated his/her understanding and acceptance.  ? ? ? ?Dental advisory given ? ?Plan Discussed with: CRNA and Surgeon ? ?Anesthesia Plan Comments: (Plan  routine monitors, GETA with pectoralis block for post op analgesia)  ? ? ? ? ? ?Anesthesia Quick Evaluation ? ?

## 2022-03-22 NOTE — Op Note (Signed)
RIGHT TOTAL MASTECTOMY  Procedure Note ? ?Brandi Roberts ?03/22/2022 ? ? ?Pre-op Diagnosis: RIGHT BREAST DCIS ?    ?Post-op Diagnosis: SAME ? ?Procedure(s): ?RIGHT TOTAL MASTECTOMY ?INJECTION OF MAGTRACE ? ?Surgeon(s): ?[name], MD ? ?Anesthesia: General ? ?Staff:  ?Circulator: Izora Ribas, RN ?Scrub Person: Lorenza Burton, CST ? ?Estimated Blood Loss: Minimal ?              ?Specimens: sent to path ? ?Indications: This is a 66 year old female with a prior history of a right breast cancer status postlumpectomy and sentinel node biopsy as well as radiation therapy in 2003.  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  The decision was made to proceed with a right total mastectomy.  The patient has decided to forego any immediate reconstruction ? ?Procedure: The patient brought to operating identifies correct patient.  She is placed upon the operating table general anesthesia was induced.  I injected mag trace for possible lymph node mapping underneath the right areola under sterile technique with an 18-gauge needle.  The breast was then thoroughly massaged.  Her right breast chest and axilla were then prepped and draped in usual sterile fashion.  I performed an elliptical incision transversely across the chest incorporating the nipple areolar complex and her previous scar on the lower breast with a scalpel.  I then dissected down to the breast tissue circumferentially with electrocautery.  I next dissected the superior skin flap with electrocautery staying just underneath the dermis and going superiorly toward the level of the clavicle and then down to the chest wall.  I then dissected the inferior skin flap with the cautery staying underneath the dermis going down to the inframammary ridge.  I took the dissection medial to lateral going to the axilla freeing up the surrounding breast tissue.  I then dissected the breast off of the chest wall staying at the pectoralis  fascia with the cautery dissecting medial to lateral.  This was again done with cautery.  I then reached the level of the axilla and completed the total mastectomy with electrocautery.  I marked the lateral margin with a silk suture.  The breast was then sent to pathology for evaluation.  We irrigated the skin flaps and chest wall with saline.  Hemostasis peer to be achieved.  I made a separate incision with a scalpel and then placed a 19 French Blake drain underneath the mastectomy flaps.  This was sewn in place with 3-0 nylon suture.  I then closed the subcutaneous tissue of the mastectomy incision with interrupted 3-0 Vicryl sutures and closed skin with a running 4-0 Monocryl.  Dermabond and a breast binder were then applied.  The patient tolerated the procedure well.  All the counts were correct at the end of the procedure.  The patient was then extubated in the operating room and taken in stable condition to the recovery room. ?        ? ?[name]  ? ?Date: 03/22/2022  Time: 10:25 AM ? ? ? ?

## 2022-03-22 NOTE — Progress Notes (Signed)
Patient had surgery this morning. Will follow for path results and determine any needed follow up.  ? ?Oncology Nurse Navigator Documentation ? ? ?  03/22/2022  ? 12:45 PM  ?Oncology Nurse Navigator Flowsheets  ?Phase of Treatment Surgery  ?Surgery Actual Start Date: 03/22/2022  ?Navigator Follow Up Date: 03/25/2022  ?Navigator Follow Up Reason: Pathology  ?Navigator Location [facility]  ?Navigator Encounter Type Appt/Treatment Plan Review  ?Treatment Phase Active Tx  ?Barriers/Navigation Needs No Barriers At This Time  ?Interventions None Required  ?Acuity Level 1-No Barriers  ?Time Spent with Patient 15  ?  ?

## 2022-03-22 NOTE — Anesthesia Procedure Notes (Signed)
Procedure Name: LMA Insertion ?Date/Time: 03/22/2022 9:18 AM ?Performed by: Janene Harvey, CRNA ?Pre-anesthesia Checklist: Patient identified, Emergency Drugs available, Suction available and Patient being monitored ?Patient Re-evaluated:Patient Re-evaluated prior to induction ?Oxygen Delivery Method: Circle system utilized ?Preoxygenation: Pre-oxygenation with 100% oxygen ?Induction Type: IV induction ?LMA: LMA inserted ?LMA Size: 4.0 ?Placement Confirmation: positive ETCO2 ?Dental Injury: Teeth and Oropharynx as per pre-operative assessment  ? ? ? ? ?

## 2022-03-22 NOTE — Interval H&P Note (Signed)
History and Physical Interval Note: no change in H and P ? ?03/22/2022 ?7:48 AM ? ?Brandi Roberts  has presented today for surgery, with the diagnosis of RIGHT BREAST DCIS.  The various methods of treatment have been discussed with the patient and family. After consideration of risks, benefits and other options for treatment, the patient has consented to  Procedure(s): ?RIGHT TOTAL MASTECTOMY (Right) as a surgical intervention.  The patient's history has been reviewed, patient examined, no change in status, stable for surgery.  I have reviewed the patient's chart and labs.  Questions were answered to the patient's satisfaction.   ? ? ?[name] ? ? ?

## 2022-03-22 NOTE — Transfer of Care (Signed)
Immediate Anesthesia Transfer of Care Note ? ?Patient: Brandi Roberts ? ?Procedure(s) Performed: RIGHT TOTAL MASTECTOMY (Right: Breast) ? ?Patient Location: PACU ? ?Anesthesia Type:General and Regional ? ?Level of Consciousness: drowsy and patient cooperative ? ?Airway & Oxygen Therapy: Patient Spontanous Breathing and Patient connected to face mask oxygen ? ?Post-op Assessment: Report given to RN and Post -op Vital signs reviewed and stable ? ?Post vital signs: Reviewed and stable ? ?Last Vitals:  ?Vitals Value Taken Time  ?BP 144/65 03/22/22 1030  ?Temp    ?Pulse 67 03/22/22 1031  ?Resp 17 03/22/22 1031  ?SpO2 100 % 03/22/22 1031  ?Vitals shown include unvalidated device data. ? ?Last Pain:  ?Vitals:  ? 03/22/22 0741  ?TempSrc: Oral  ?PainSc: 8   ?   ? ?Patients Stated Pain Goal: 5 (03/22/22 0741) ? ?Complications: No notable events documented. ?

## 2022-03-22 NOTE — Anesthesia Procedure Notes (Signed)
Anesthesia Regional Block: Pectoralis block  ? ?Pre-Anesthetic Checklist: , timeout performed,  Correct Patient, Correct Site, Correct Laterality,  Correct Procedure, Correct Position, site marked,  Risks and benefits discussed,  Surgical consent,  Pre-op evaluation,  At surgeon's request and post-op pain management ? ?Laterality: Right ? ?Prep: chloraprep     ?  ?Needles:  ?Injection technique: Single-shot ? ?Needle Type: Echogenic Needle   ? ? ?Needle Length: 9cm  ?Needle Gauge: 21  ? ? ? ?Additional Needles: ? ? ?Procedures:,,,, ultrasound used (permanent image in chart),,    ?Narrative:  ?Start time: 03/22/2022 8:25 AM ?End time: 03/22/2022 8:31 AM ?Injection made incrementally with aspirations every 5 mL. ? ?Performed by: Personally  ?Anesthesiologist: Annye Asa, MD ? ?Additional Notes: ?Pt identified in Holding room.  Monitors applied. Working IV access confirmed. Sterile prep R clavicle and pec.  #21ga ECHOgenic Arrow block needle betweek pec minior and intercostal, between ribs 4,5 with US guidance.  30cc 0.5% Bupivacaine with 1:200k epi injected incrementally after negative test dose.  Patient asymptomatic, VSS, no heme aspirated, tolerated well.   Jenita Seashore, MD ? ? ? ? ?

## 2022-03-23 ENCOUNTER — Encounter (HOSPITAL_BASED_OUTPATIENT_CLINIC_OR_DEPARTMENT_OTHER): Payer: Self-pay | Admitting: Surgery

## 2022-03-23 MED ORDER — TRAMADOL HCL 50 MG PO TABS: 50.0000 mg | ORAL_TABLET | Freq: Four times a day (QID) | ORAL | 0 refills | Status: DC | PRN

## 2022-03-23 NOTE — Discharge Instructions (Signed)
[facility] [phone]  MASTECTOMY: POST OP INSTRUCTIONS  Always review your discharge instruction sheet given to you by the facility where your surgery was performed. IF YOU HAVE DISABILITY OR FAMILY LEAVE FORMS, YOU MUST BRING THEM TO THE OFFICE FOR PROCESSING.   DO NOT GIVE THEM TO YOUR DOCTOR. A prescription for pain medication may be given to you upon discharge.  Take your pain medication as prescribed, if needed.  If narcotic pain medicine is not needed, then you may take acetaminophen (Tylenol) or ibuprofen (Advil) as needed. Take your usually prescribed medications unless otherwise directed. If you need a refill on your pain medication, please contact your pharmacy.  They will contact our office to request authorization.  Prescriptions will not be filled after 5pm or on week-ends. You should follow a light diet the first few days after arrival home, such as soup and crackers, etc.  Resume your normal diet the day after surgery. Most patients will experience some swelling and bruising on the chest and underarm.  Ice packs will help.  Swelling and bruising can take several days to resolve.  It is common to experience some constipation if taking pain medication after surgery.  Increasing fluid intake and taking a stool softener (such as Colace) will usually help or prevent this problem from occurring.  A mild laxative (Milk of Magnesia or Miralax) should be taken according to package instructions if there are no bowel movements after 48 hours. Unless discharge instructions indicate otherwise, leave your bandage dry and in place until your next appointment in 3-5 days.  You may take a limited sponge bath.  No tube baths or showers until the drains are removed.  You may have steri-strips (small skin tapes) in place directly over the incision.  These strips should be left on the skin for 7-10 days.  If your surgeon used skin glue on the incision, you may shower in 24 hours.   The glue will flake off over the next 2-3 weeks.  Any sutures or staples will be removed at the office during your follow-up visit. DRAINS:  If you have drains in place, it is important to keep a list of the amount of drainage produced each day in your drains.  Before leaving the hospital, you should be instructed on drain care.  Call our office if you have any questions about your drains. ACTIVITIES:  You may resume regular (light) daily activities beginning the next day--such as daily self-care, walking, climbing stairs--gradually increasing activities as tolerated.  You may have sexual intercourse when it is comfortable.  Refrain from any heavy lifting or straining until approved by your doctor. You may drive when you are no longer taking prescription pain medication, you can comfortably wear a seatbelt, and you can safely maneuver your car and apply brakes. RETURN TO WORK:  __________________________________________________________ You should see your doctor in the office for a follow-up appointment approximately 3-5 days after your surgery.  Your doctor's nurse will typically make your follow-up appointment when she calls you with your pathology report.  Expect your pathology report 2-3 business days after your surgery.  You may call to check if you do not hear from us after three days.   OTHER INSTRUCTIONS: ______________________________________________________________________________________________ ____________________________________________________________________________________________ WHEN TO CALL YOUR DOCTOR: Fever over 101.0 Nausea and/or vomiting Extreme swelling or bruising Continued bleeding from incision. Increased pain, redness, or drainage from the incision. The clinic staff is available to answer your questions during regular business hours.  Please don't hesitate   to call and ask to speak to one of the nurses for clinical concerns.  If you have a medical emergency, go to the  nearest emergency room or call 911.  A surgeon from [facility] is always on call at the hospital. [address], [address], [place], [place]  [address] ? [address] [address], [place],    [address] [phone] ? [phone] ? FAX [phone] Web site: [email or web address]  

## 2022-03-23 NOTE — Addendum Note (Signed)
Addendum  created 03/23/22 2620 by Madina Galati, Ernesta Amble, CRNA  ? Charge Capture section accepted, Visit diagnoses modified  ?  ?

## 2022-03-23 NOTE — Discharge Summary (Signed)
Physician Discharge Summary  ?Patient ID: ?Brandi Roberts ?MRN: [id number] ?DOB/AGE: 04/13/1956 66 y.o. ? ?Admit date: 03/22/2022 ?Discharge date: 03/23/2022 ? ?Admission Diagnoses: ? ?Discharge Diagnoses:  ?Principal Problem: ?  S/P mastectomy, right ? ? ?Discharged Condition: good ? ?Hospital Course: uneventful post op course.  Discharged home POD#1 ? ?Consults: None ? ?Significant Diagnostic Studies:  ? ?Treatments: surgery: right total mastectomy ? ?Discharge Exam: ?Blood pressure (!) 147/62, pulse 65, temperature 98.3 ?F (36.8 ?C), resp. rate 16, height '5\' 6"'$  (1.676 m), weight 94.4 kg, SpO2 100 %. ?General appearance: alert, cooperative, and no distress ?Resp: clear to auscultation bilaterally ?Cardio: regular rate and rhythm, S1, S2 normal, no murmur, click, rub or gallop ?Incision/Wound: right chest flaps viable, no hematoma, drain serosang ? ?Disposition: Discharge disposition: 01-Home or Self Care ? ? ? ? ? ? ? ?Allergies as of 03/23/2022   ? ?   Reactions  ? Levaquin [levofloxacin] Nausea And Vomiting, Other (See Comments)  ? dizziness  ? ?  ? ?  ?Medication List  ?  ? ?STOP taking these medications   ? ?oxyCODONE-acetaminophen 5-325 MG tablet ?Commonly known as: PERCOCET/ROXICET ?  ? ?  ? ?TAKE these medications   ? ?albuterol 108 (90 Base) MCG/ACT inhaler ?Commonly known as: VENTOLIN HFA ?Inhale 2 puffs into the lungs every 6 (six) hours as needed. ?  ?ALPRAZolam 0.5 MG tablet ?Commonly known as: Duanne Moron ?Take 0.5 mg by mouth 2 (two) times daily as needed. ?  ?amLODipine 10 MG tablet ?Commonly known as: NORVASC ?Take 10 mg by mouth daily. ?  ?atorvastatin 10 MG tablet ?Commonly known as: LIPITOR ?Take 5 mg by mouth at bedtime. ?  ?benazepril 40 MG tablet ?Commonly known as: LOTENSIN ?Take 40 mg by mouth daily. ?  ?doxazosin 2 MG tablet ?Commonly known as: CARDURA ?Take 2 mg by mouth daily as needed. ?  ?Eliquis 5 MG Tabs tablet ?Generic drug: apixaban ?Take 5 mg by mouth 2 (two) times daily. ?   ?gabapentin 300 MG capsule ?Commonly known as: Neurontin ?300 mg orally on day 1, 300 mg twice a day on day 2, and 300 mg 3 times a day on day 3 and until control of symptoms ?  ?gabapentin 600 MG tablet ?Commonly known as: NEURONTIN ?Take 600 mg by mouth 3 (three) times daily. ?  ?hydrALAZINE 50 MG tablet ?Commonly known as: APRESOLINE ?Take 50 mg by mouth 2 (two) times daily. ?  ?metFORMIN 500 MG tablet ?Commonly known as: GLUCOPHAGE ?Take by mouth 2 (two) times daily with a meal. ?  ?metFORMIN 1000 MG tablet ?Commonly known as: GLUCOPHAGE ?Take 1,000 mg by mouth 2 (two) times daily. ?  ?methylPREDNISolone 4 MG Tbpk tablet ?Commonly known as: Medrol ?As prescribed ?  ?metoprolol succinate 100 MG 24 hr tablet ?Commonly known as: TOPROL-XL ?Take 100 mg by mouth daily. ?  ?omeprazole 20 MG capsule ?Commonly known as: PRILOSEC ?Take 1 capsule by mouth daily. ?  ?ondansetron 4 MG disintegrating tablet ?Commonly known as: ZOFRAN-ODT ?Take 1 tablet (4 mg total) by mouth every 8 (eight) hours as needed for nausea or vomiting. ?  ?predniSONE 10 MG tablet ?Commonly known as: DELTASONE ?Take 2 tablets (20 mg total) by mouth daily. ?  ?sulfamethoxazole-trimethoprim 800-160 MG tablet ?Commonly known as: BACTRIM DS ?Take 1 tablet by mouth 2 (two) times daily for 14 days. ?  ?traMADol 50 MG tablet ?Commonly known as: ULTRAM ?Take 1 tablet (50 mg total) by mouth every 6 (six) hours as needed. ?What  changed:  ?when to take this ?reasons to take this ?  ?triamterene-hydrochlorothiazide 75-50 MG tablet ?Commonly known as: MAXZIDE ?Take 1 tablet by mouth daily. ?  ?Vitamin D (Ergocalciferol) 1.25 MG (50000 UNIT) Caps capsule ?Commonly known as: DRISDOL ?Take 50,000 Units by mouth once a week. ?  ?warfarin 7.5 MG tablet ?Commonly known as: COUMADIN ?Take 5-7.5 mg by mouth daily. Alternates with 7.5 and 5 every other day. ?  ? ?  ? ? Follow-up Information   ? ? [name], MD Follow up on 04/12/2022.   ?Specialty: General  Surgery ?Why: call office to find out time ?Contact information: ?[address] ?[address] ?[place] [address] ?[phone] ? ? ?  ?  ? ?  ?  ? ?  ? ? ?Signed: ?[name] ?03/23/2022, 7:37 AM ? ? ?

## 2022-03-24 LAB — SURGICAL PATHOLOGY

## 2022-03-25 ENCOUNTER — Encounter: Payer: Self-pay | Admitting: *Deleted

## 2022-03-25 NOTE — Progress Notes (Signed)
Patient had her mastectomy on 3/28 and path has posted. 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 Reviewed with Dr Marin Olp and he would like her to come in a discuss possible treatment in approximately 4 weeks.  ? ?Called patient. She is doing well after her surgery. Explained to her that Dr Marin Olp would like to see her in about 4 weeks to discuss potential treatment after her surgery. She agreed to schedule.  ? ?She is aware of appointment date, time and location. I will also mail new patient packet to her home today.  ? ?Oncology Nurse Navigator Documentation ? ? ?  03/25/2022  ?  9:00 AM  ?Oncology Nurse Navigator Flowsheets  ?Navigator Follow Up Date: 04/18/2022  ?Navigator Follow Up Reason: New Patient Appointment  ?Navigator Location [facility]  ?Navigator Encounter Type Introductory Phone Call  ?Patient Visit Type MedOnc  ?Treatment Phase Active Tx  ?Barriers/Navigation Needs Coordination of Care;Education  ?Interventions Coordination of Care;Education  ?Acuity Level 2-Minimal Needs (1-2 Barriers Identified)  ?Coordination of Care Appts  ?Education Method Verbal;Written  ?Support Groups/Services Friends and Family  ?Time Spent with Patient 30  ?  ?

## 2022-03-28 ENCOUNTER — Other Ambulatory Visit: Payer: Self-pay | Admitting: Surgery

## 2022-03-30 ENCOUNTER — Other Ambulatory Visit: Payer: Self-pay | Admitting: *Deleted

## 2022-03-30 ENCOUNTER — Encounter: Payer: Medicare Other | Admitting: Plastic Surgery

## 2022-03-30 DIAGNOSIS — C50911 Malignant neoplasm of unspecified site of right female breast: Secondary | ICD-10-CM

## 2022-03-31 ENCOUNTER — Ambulatory Visit: Payer: Medicare Other

## 2022-04-04 ENCOUNTER — Other Ambulatory Visit: Payer: Self-pay

## 2022-04-04 ENCOUNTER — Encounter (HOSPITAL_BASED_OUTPATIENT_CLINIC_OR_DEPARTMENT_OTHER): Payer: Self-pay | Admitting: Surgery

## 2022-04-08 ENCOUNTER — Encounter (HOSPITAL_BASED_OUTPATIENT_CLINIC_OR_DEPARTMENT_OTHER)
Admission: RE | Admit: 2022-04-08 | Discharge: 2022-04-08 | Disposition: A | Discharge status: Home / Self Care | Payer: Medicare Other | Source: Ambulatory Visit | Attending: Surgery | Admitting: Surgery

## 2022-04-08 DIAGNOSIS — Z01812 Encounter for preprocedural laboratory examination: Secondary | ICD-10-CM | POA: Diagnosis not present

## 2022-04-08 DIAGNOSIS — Z79899 Other long term (current) drug therapy: Secondary | ICD-10-CM | POA: Diagnosis not present

## 2022-04-08 LAB — BASIC METABOLIC PANEL
Anion gap: 7 (ref 5–15)
BUN: 25 mg/dL — ABNORMAL HIGH (ref 8–23)
CO2: 25 mmol/L (ref 22–32)
Calcium: 9 mg/dL (ref 8.9–10.3)
Chloride: 101 mmol/L (ref 98–111)
Creatinine, Ser: 1.71 mg/dL — ABNORMAL HIGH (ref 0.44–1.00)
GFR, Estimated: 32 mL/min — ABNORMAL LOW (ref >60)
Glucose, Bld: 100 mg/dL — ABNORMAL HIGH (ref 70–99)
Potassium: 4.4 mmol/L (ref 3.5–5.1)
Sodium: 133 mmol/L — ABNORMAL LOW (ref 135–145)

## 2022-04-12 ENCOUNTER — Ambulatory Visit: Payer: Medicare Other | Attending: Surgery | Admitting: Physical Therapy

## 2022-04-12 ENCOUNTER — Encounter: Payer: Self-pay | Admitting: Physical Therapy

## 2022-04-12 DIAGNOSIS — M25611 Stiffness of right shoulder, not elsewhere classified: Secondary | ICD-10-CM | POA: Insufficient documentation

## 2022-04-12 DIAGNOSIS — M25612 Stiffness of left shoulder, not elsewhere classified: Secondary | ICD-10-CM | POA: Insufficient documentation

## 2022-04-12 DIAGNOSIS — R293 Abnormal posture: Secondary | ICD-10-CM | POA: Insufficient documentation

## 2022-04-12 DIAGNOSIS — C50911 Malignant neoplasm of unspecified site of right female breast: Secondary | ICD-10-CM | POA: Diagnosis present

## 2022-04-12 NOTE — Therapy (Signed)
?OUTPATIENT PHYSICAL THERAPY BREAST CANCER BASELINE EVALUATION ? ? ?Patient Name: Brandi Roberts ?MRN: [id number] ?DOB:1956/12/03, 66 y.o., female ?Today's Date: 04/12/2022 ? ? PT End of Session - 04/12/22 1441   ? ? Visit Number 1   ? Number of Visits 2   ? Date for PT Re-Evaluation 05/10/22   ? PT Start Time 8592   ? PT Stop Time 1440   ? PT Time Calculation (min) 37 min   ? Activity Tolerance Patient tolerated treatment well   ? Behavior During Therapy [facility] for tasks assessed/performed   ? ?  ?  ? ?  ? ? ?Past Medical History:  ?Diagnosis Date  ? Acid reflux   ? Asthma   ? Breast cancer ([place])   ? Diabetes mellitus without complication ([place])   ? DVT (deep venous thrombosis) ([place])   ? Hypertension   ? Neuropathy   ? Shingles   ? ?Past Surgical History:  ?Procedure Laterality Date  ? BREAST SURGERY    ? CHOLECYSTECTOMY    ? TOTAL MASTECTOMY Right 03/22/2022  ? Procedure: RIGHT TOTAL MASTECTOMY;  Surgeon: [name], MD;  Location: [place];  Service: General;  Laterality: Right;  ? ?Patient Active Problem List  ? Diagnosis Date Noted  ? S/P mastectomy, right 03/22/2022  ? DVT (deep venous thrombosis) ([place]) 03/09/2022  ? ? ?PCP: Marjo Bicker, MD ? ?REFERRING PROVIDER: [name], MD ? ?REFERRING DIAG: C50.911 (ICD-10-CM) - Malignant neoplasm of right female breast, unspecified estrogen receptor status, unspecified site of breast ([place]) ? ?THERAPY DIAG:  ?Abnormal posture ? ?Stiffness of right shoulder, not elsewhere classified ? ?Stiffness of left shoulder, not elsewhere classified ? ?Malignant neoplasm of right female breast, unspecified estrogen receptor status, unspecified site of breast ([place]) ? ?ONSET DATE: 03/22/22 ? ?SUBJECTIVE                                                                                                                                                                                          ? ?SUBJECTIVE STATEMENT: ?Patient reports she is here today to be  seen by her medical team for her newly diagnosed right breast cancer.  ? 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 It measures 1 cm. It is HER2+ , ER/PR- with a Ki67 of 40%. Had R breast cancer and R lumpectomy and SLNB 10 years ago in 2003 previously with chemo and radiation.  ? ?PATIENT GOALS   reduce lymphedema risk and learn post op HEP.  ? ?PAIN:  ?Are you having pain? Yes: NPRS scale: 8/10 ?Pain location: R axilla ?Pain description:  sharp ?Aggravating factors: reaching upwards ?Relieving factors: medication ? ? ?PRECAUTIONS: Active CA  ? ?HAND DOMINANCE: right ? ?WEIGHT BEARING RESTRICTIONS No ? ?FALLS:  ?Has patient fallen in last 6 months? No ? ?LIVING ENVIRONMENT: ?Patient lives with: husband ?Lives in: House/apartment ?Has following equipment at home: None ? ?OCCUPATION: retired ? ?LEISURE: pt does not currently exercise ? ?PRIOR LEVEL OF FUNCTION: Independent ? ? ?OBJECTIVE ? ?COGNITION: ? Overall cognitive status: Within functional limits for tasks assessed   ? ?POSTURE:  ?Forward head and rounded shoulders posture ? ?UPPER EXTREMITY AROM/PROM: ? ?A/PROM RIGHT  04/12/2022 ?  ?Shoulder extension 44  ?Shoulder flexion 100  ?Shoulder abduction 84  ?Shoulder internal rotation 25  ?Shoulder external rotation 62  ?  (Blank rows = not tested) ? ?A/PROM LEFT  04/12/2022  ?Shoulder extension 66  ?Shoulder flexion 134  ?Shoulder abduction 122  ?Shoulder internal rotation 80  ?Shoulder external rotation 78  ?  (Blank rows = not tested) ? ? ? ?UPPER EXTREMITY STRENGTH: not tested due to recent surgery ? ? ?LYMPHEDEMA ASSESSMENTS:  ? ?LANDMARK RIGHT  04/12/2022  ?10 cm proximal to olecranon process 33.9  ?Olecranon process 26.5  ?10 cm proximal to ulnar styloid process 21.8  ?Just proximal to ulnar styloid process 17  ?Across hand at thumb web space 20.2  ?At base of 2nd digit 6.6  ?(Blank rows = not tested) ? ?Plantation LEFT  04/12/2022  ?10 cm proximal to olecranon process 32.4   ?Olecranon process 26  ?10 cm proximal to ulnar styloid process 22.1  ?Just proximal to ulnar styloid process 17  ?Across hand at thumb web space 20  ?At base of 2nd digit 6.3  ?(Blank rows = not tested) ? ? ?L-DEX LYMPHEDEMA SCREENING: ? ?The patient was assessed using the L-Dex machine today to produce a lymphedema index baseline score. The patient will be reassessed on a regular basis (typically every 3 months) to obtain new L-Dex scores. If the score is > 6.5 points away from his/her baseline score indicating onset of subclinical lymphedema, it will be recommended to wear a compression garment for 4 weeks, 12 hours per day and then be reassessed. If the score continues to be > 6.5 points from baseline at reassessment, we will initiate lymphedema treatment. Assessing in this manner has a 95% rate of preventing clinically significant lymphedema. ? ? L-DEX FLOWSHEETS - 04/12/22 1400   ? ?  ? L-DEX LYMPHEDEMA SCREENING  ? Measurement Type Unilateral   ? L-DEX MEASUREMENT EXTREMITY Upper Extremity   ? POSITION  Standing   ? DOMINANT SIDE Right   ? At Risk Side Right   ? BASELINE SCORE (UNILATERAL) -5.9   ? ?  ?  ? ?  ? ? ? ?QUICK DASH SURVEY:  ? Katina Dung - 04/12/22 0001   ? ? Open a tight or new jar No difficulty   ? Do heavy household chores (wash walls, wash floors) No difficulty   ? Carry a shopping bag or briefcase No difficulty   ? Wash your back No difficulty   ? Use a knife to cut food No difficulty   ? Recreational activities in which you take some force or impact through your arm, shoulder, or hand (golf, hammering, tennis) No difficulty   ? During the past week, to what extent has your arm, shoulder or hand problem interfered with your normal social activities with family, friends, neighbors, or groups? Modererately   ? During the past week, to what extent  has your arm, shoulder or hand problem limited your work or other regular daily activities Quite a bit   ? Arm, shoulder, or hand pain. Moderate   ?  Tingling (pins and needles) in your arm, shoulder, or hand Mild   ? Difficulty Sleeping Moderate difficulty   ? DASH Score 22.73 %   ? ?  ?  ? ?  ? ? Katina Dung - 04/12/22 0001   ? ? Open a tight or new jar No difficulty   ? Do heavy household chores (wash walls, wash floors) No difficulty   ? Carry a shopping bag or briefcase No difficulty   ? Wash your back No difficulty   ? Use a knife to cut food No difficulty   ? Recreational activities in which you take some force or impact through your arm, shoulder, or hand (golf, hammering, tennis) No difficulty   ? During the past week, to what extent has your arm, shoulder or hand problem interfered with your normal social activities with family, friends, neighbors, or groups? Modererately   ? During the past week, to what extent has your arm, shoulder or hand problem limited your work or other regular daily activities Quite a bit   ? Arm, shoulder, or hand pain. Moderate   ? Tingling (pins and needles) in your arm, shoulder, or hand Mild   ? Difficulty Sleeping Moderate difficulty   ? DASH Score 22.73 %   ? ?  ?  ? ?  ? ? ? ? ? ?PATIENT EDUCATION:  ?Education details: Lymphedema risk reduction and post op shoulder/posture HEP ?Person educated: Patient ?Education method: Explanation, Demonstration, Handout ?Education comprehension: Patient verbalized understanding and returned demonstration ? ? ?HOME EXERCISE PROGRAM: ?Patient was instructed today in a home exercise program today for post op shoulder range of motion. These included active assist shoulder flexion in sitting, scapular retraction, wall walking with shoulder abduction, and hands behind head external rotation.  She was encouraged to do these twice a day, holding 3 seconds and repeating 5 times when permitted by her physician. ? ? ?ASSESSMENT: ? ?CLINICAL IMPRESSION: ?Pt with recently diagnosed R breast cancer. She had a previous history of R breast cancer in 2003 s/p R lumpectomy and SLNB and completed chemo  and radiation. This time she underwent a R mastectomy on 03/22/22 and will undergo a SLNB on 04/14/22 due to pathology results. Today she has pain in her R axilla and decreased R shoulder ROM. Her left should

## 2022-04-13 NOTE — H&P (Signed)
?  PROVIDER: Beverlee Nims, MD ? ?MRN: [id number] ?DOB: 03-27-1956 ?DATE OF ENCOUNTER: 04/12/2022 ?Interval History:  ? ?She is here for another postoperative visit status post right mastectomy for DCIS. She still has a drain in place. A small 1 cm focus of invasive cancer which was ER/PR negative, HER2 positive was found at the time of mastectomy. I had injected mag trace prior to the mastectomy. It is now recommended we do a sentinel node biopsy. This is already planned for 2 days from now. She reports she has been doing well. ? ?Medical History: ?Past Medical History:  ?Diagnosis Date  ? Diabetes mellitus without complication (CMS-HCC)  ? DVT (deep venous thrombosis) (CMS-HCC)  ? History of cancer  ? Hypertension  ? Thyroid disease  ? ?Current Outpatient Medications on File Prior to Visit  ?Medication Sig Dispense Refill  ? amLODIPine (NORVASC) 10 MG tablet Take 10 mg by mouth once daily  ? apixaban (ELIQUIS) 5 mg tablet Take 5 mg by mouth 2 (two) times daily  ? atorvastatin (LIPITOR) 10 MG tablet Take 5 mg by mouth at bedtime  ? benazepriL (LOTENSIN) 40 MG tablet Take 40 mg by mouth once daily  ? doxazosin (CARDURA) 2 MG tablet Take by mouth  ? ergocalciferol, vitamin D2, 1,250 mcg (50,000 unit) capsule  ? hydrALAZINE (APRESOLINE) 50 MG tablet Take 50 mg by mouth 2 (two) times daily  ? metFORMIN (GLUCOPHAGE) 1000 MG tablet Take 1,000 mg by mouth 2 (two) times daily  ? metoprolol succinate (TOPROL-XL) 100 MG XL tablet Take 100 mg by mouth once daily  ? triamterene-hydrochlorothiazide (MAXZIDE) 75-50 mg tablet   ? ? ?Physical Exam  ? ?She appears well on exam. There is a hematoma at the mastectomy site medially. The drain is not draining any fluid. I elected to go ahead and remove the drain. ? ?Assessment and Plan:  ? ? ?Diagnoses and all orders for this visit: ? ?Postop check ? ? ? ?I contacted her oncologist regarding the final pathology results. He would like a sentinel node biopsy. I was told to hold on  Port-A-Cath placement. We will proceed with sentinel node biopsy 2 days from now. I again discussed the risk of the procedure in detail. These risk include but are not limited to bleeding, infection, seroma formation, injury to surrounding structures, etc. ? ? ? ?

## 2022-04-14 ENCOUNTER — Other Ambulatory Visit: Payer: Self-pay

## 2022-04-14 ENCOUNTER — Ambulatory Visit (HOSPITAL_BASED_OUTPATIENT_CLINIC_OR_DEPARTMENT_OTHER): Payer: Medicare Other | Admitting: Anesthesiology

## 2022-04-14 ENCOUNTER — Ambulatory Visit (HOSPITAL_BASED_OUTPATIENT_CLINIC_OR_DEPARTMENT_OTHER)
Admission: RE | Admit: 2022-04-14 | Discharge: 2022-04-14 | Disposition: A | Discharge status: Home / Self Care | Payer: Medicare Other | Attending: Surgery | Admitting: Surgery

## 2022-04-14 ENCOUNTER — Encounter (HOSPITAL_BASED_OUTPATIENT_CLINIC_OR_DEPARTMENT_OTHER)
Admission: RE | Disposition: A | Discharge status: Home / Self Care | Payer: Self-pay | Source: Home / Self Care | Attending: Surgery

## 2022-04-14 ENCOUNTER — Encounter (HOSPITAL_BASED_OUTPATIENT_CLINIC_OR_DEPARTMENT_OTHER): Payer: Self-pay | Admitting: Surgery

## 2022-04-14 DIAGNOSIS — E119 Type 2 diabetes mellitus without complications: Secondary | ICD-10-CM | POA: Insufficient documentation

## 2022-04-14 DIAGNOSIS — I10 Essential (primary) hypertension: Secondary | ICD-10-CM | POA: Insufficient documentation

## 2022-04-14 DIAGNOSIS — Z79899 Other long term (current) drug therapy: Secondary | ICD-10-CM

## 2022-04-14 DIAGNOSIS — C50911 Malignant neoplasm of unspecified site of right female breast: Secondary | ICD-10-CM | POA: Diagnosis present

## 2022-04-14 DIAGNOSIS — Z7984 Long term (current) use of oral hypoglycemic drugs: Secondary | ICD-10-CM | POA: Diagnosis not present

## 2022-04-14 DIAGNOSIS — Z171 Estrogen receptor negative status [ER-]: Secondary | ICD-10-CM

## 2022-04-14 DIAGNOSIS — C773 Secondary and unspecified malignant neoplasm of axilla and upper limb lymph nodes: Secondary | ICD-10-CM | POA: Diagnosis not present

## 2022-04-14 DIAGNOSIS — Z87891 Personal history of nicotine dependence: Secondary | ICD-10-CM | POA: Diagnosis not present

## 2022-04-14 DIAGNOSIS — K219 Gastro-esophageal reflux disease without esophagitis: Secondary | ICD-10-CM | POA: Insufficient documentation

## 2022-04-14 HISTORY — PX: AXILLARY SENTINEL NODE BIOPSY: SHX5738

## 2022-04-14 HISTORY — DX: Malignant neoplasm of unspecified site of unspecified female breast: C50.919

## 2022-04-14 LAB — GLUCOSE, CAPILLARY
Glucose-Capillary: 103 mg/dL — ABNORMAL HIGH (ref 70–99)
Glucose-Capillary: 106 mg/dL — ABNORMAL HIGH (ref 70–99)

## 2022-04-14 SURGERY — BIOPSY, LYMPH NODE, SENTINEL, AXILLARY
Anesthesia: General | Site: Axilla | Laterality: Right

## 2022-04-14 MED ORDER — SODIUM CHLORIDE 0.9 % IR SOLN
Status: DC | PRN
  Administered 2022-04-14: 250 mL

## 2022-04-14 MED ORDER — FENTANYL CITRATE (PF) 100 MCG/2ML IJ SOLN
25.0000 ug | INTRAMUSCULAR | Status: DC | PRN
  Administered 2022-04-14: 50 ug via INTRAVENOUS

## 2022-04-14 MED ORDER — ONDANSETRON HCL 4 MG/2ML IJ SOLN
INTRAMUSCULAR | Status: DC | PRN
  Administered 2022-04-14: 4 mg via INTRAVENOUS

## 2022-04-14 MED ORDER — FENTANYL CITRATE (PF) 100 MCG/2ML IJ SOLN
INTRAMUSCULAR | Status: DC | PRN
  Administered 2022-04-14 (×2): 25 ug via INTRAVENOUS
  Administered 2022-04-14: 50 ug via INTRAVENOUS

## 2022-04-14 MED ORDER — BUPIVACAINE-EPINEPHRINE (PF) 0.5% -1:200000 IJ SOLN
INTRAMUSCULAR | Status: AC
  Filled 2022-04-14: qty 30

## 2022-04-14 MED ORDER — LACTATED RINGERS IV SOLN: INTRAVENOUS | Status: DC | PRN

## 2022-04-14 MED ORDER — CHLORHEXIDINE GLUCONATE CLOTH 2 % EX PADS
6.0000 | MEDICATED_PAD | Freq: Once | CUTANEOUS | Status: DC
Start: 2022-04-14 — End: 2022-04-14

## 2022-04-14 MED ORDER — ACETAMINOPHEN 500 MG PO TABS
1000.0000 mg | ORAL_TABLET | ORAL | Status: AC
  Administered 2022-04-14: 1000 mg via ORAL

## 2022-04-14 MED ORDER — FENTANYL CITRATE (PF) 100 MCG/2ML IJ SOLN
INTRAMUSCULAR | Status: AC
  Filled 2022-04-14: qty 2

## 2022-04-14 MED ORDER — PROPOFOL 10 MG/ML IV BOLUS
INTRAVENOUS | Status: DC | PRN
  Administered 2022-04-14: 150 mg via INTRAVENOUS

## 2022-04-14 MED ORDER — FENTANYL CITRATE (PF) 100 MCG/2ML IJ SOLN
100.0000 ug | Freq: Once | INTRAMUSCULAR | Status: AC
  Administered 2022-04-14: 50 ug via INTRAVENOUS

## 2022-04-14 MED ORDER — MIDAZOLAM HCL 2 MG/2ML IJ SOLN
INTRAMUSCULAR | Status: AC
  Filled 2022-04-14: qty 2

## 2022-04-14 MED ORDER — LIDOCAINE HCL (CARDIAC) PF 100 MG/5ML IV SOSY
PREFILLED_SYRINGE | INTRAVENOUS | Status: DC | PRN
  Administered 2022-04-14: 60 mg via INTRAVENOUS

## 2022-04-14 MED ORDER — EPHEDRINE SULFATE (PRESSORS) 50 MG/ML IJ SOLN
INTRAMUSCULAR | Status: DC | PRN
Start: 2022-04-14 — End: 2022-04-14
  Administered 2022-04-14: 10 mg via INTRAVENOUS

## 2022-04-14 MED ORDER — ONDANSETRON HCL 4 MG/2ML IJ SOLN
INTRAMUSCULAR | Status: AC
  Filled 2022-04-14: qty 2

## 2022-04-14 MED ORDER — FENTANYL CITRATE (PF) 100 MCG/2ML IJ SOLN
INTRAMUSCULAR | Status: AC
Start: 2022-04-14 — End: ?
  Filled 2022-04-14: qty 2

## 2022-04-14 MED ORDER — LACTATED RINGERS IV SOLN: INTRAVENOUS | Status: DC

## 2022-04-14 MED ORDER — MIDAZOLAM HCL 2 MG/2ML IJ SOLN
INTRAMUSCULAR | Status: DC | PRN
  Administered 2022-04-14: 1 mg via INTRAVENOUS

## 2022-04-14 MED ORDER — BUPIVACAINE-EPINEPHRINE (PF) 0.5% -1:200000 IJ SOLN
INTRAMUSCULAR | Status: DC | PRN
  Administered 2022-04-14: 20 mL

## 2022-04-14 MED ORDER — GLYCOPYRROLATE 0.2 MG/ML IJ SOLN
INTRAMUSCULAR | Status: DC | PRN
  Administered 2022-04-14: .1 mg via INTRAVENOUS

## 2022-04-14 MED ORDER — DEXAMETHASONE SODIUM PHOSPHATE 10 MG/ML IJ SOLN
INTRAMUSCULAR | Status: DC | PRN
  Administered 2022-04-14: 5 mg via INTRAVENOUS

## 2022-04-14 MED ORDER — PROPOFOL 10 MG/ML IV BOLUS
INTRAVENOUS | Status: AC
  Filled 2022-04-14: qty 20

## 2022-04-14 MED ORDER — OXYCODONE HCL 5 MG PO TABS: 5.0000 mg | ORAL_TABLET | Freq: Four times a day (QID) | ORAL | 0 refills | Status: DC | PRN

## 2022-04-14 MED ORDER — CEFAZOLIN SODIUM-DEXTROSE 2-4 GM/100ML-% IV SOLN: 2.0000 g | INTRAVENOUS | Status: DC

## 2022-04-14 MED ORDER — CEFAZOLIN SODIUM-DEXTROSE 2-4 GM/100ML-% IV SOLN
INTRAVENOUS | Status: AC
  Filled 2022-04-14: qty 100

## 2022-04-14 MED ORDER — BUPIVACAINE-EPINEPHRINE 0.5% -1:200000 IJ SOLN
INTRAMUSCULAR | Status: DC | PRN
Start: 2022-04-14 — End: 2022-04-14
  Administered 2022-04-14: 2 mL

## 2022-04-14 MED ORDER — BUPIVACAINE LIPOSOME 1.3 % IJ SUSP
INTRAMUSCULAR | Status: DC | PRN
  Administered 2022-04-14: 10 mL

## 2022-04-14 MED ORDER — ACETAMINOPHEN 500 MG PO TABS
ORAL_TABLET | ORAL | Status: AC
  Filled 2022-04-14: qty 2

## 2022-04-14 MED ORDER — CEFAZOLIN SODIUM-DEXTROSE 2-3 GM-%(50ML) IV SOLR
INTRAVENOUS | Status: DC | PRN
  Administered 2022-04-14: 2 g via INTRAVENOUS

## 2022-04-14 SURGICAL SUPPLY — 49 items
ADH SKN CLS APL DERMABOND .7 (GAUZE/BANDAGES/DRESSINGS) ×2
APL PRP STRL LF DISP 70% ISPRP (MISCELLANEOUS) ×1
APPLIER CLIP 9.375 MED OPEN (MISCELLANEOUS)
APR CLP MED 9.3 20 MLT OPN (MISCELLANEOUS)
BLADE CLIPPER SURG (BLADE) IMPLANT
BLADE SURG 15 STRL LF DISP TIS (BLADE) ×2 IMPLANT
BLADE SURG 15 STRL SS (BLADE) ×4
CANISTER SUCT 1200ML W/VALVE (MISCELLANEOUS) ×2 IMPLANT
CHLORAPREP W/TINT 26 (MISCELLANEOUS) ×2 IMPLANT
CLIP APPLIE 9.375 MED OPEN (MISCELLANEOUS) IMPLANT
COVER BACK TABLE 60X90IN (DRAPES) ×4 IMPLANT
COVER MAYO STAND STRL (DRAPES) ×4 IMPLANT
COVER PROBE W GEL 5X96 (DRAPES) ×2 IMPLANT
DERMABOND ADVANCED (GAUZE/BANDAGES/DRESSINGS) ×2
DERMABOND ADVANCED .7 DNX12 (GAUZE/BANDAGES/DRESSINGS) ×2 IMPLANT
DRAIN CHANNEL 19F RND (DRAIN) IMPLANT
DRAIN HEMOVAC 1/8 X 5 (WOUND CARE) IMPLANT
DRAPE LAPAROSCOPIC ABDOMINAL (DRAPES) ×2 IMPLANT
DRAPE UTILITY XL STRL (DRAPES) ×4 IMPLANT
ELECT REM PT RETURN 9FT ADLT (ELECTROSURGICAL) ×2
ELECTRODE REM PT RTRN 9FT ADLT (ELECTROSURGICAL) ×1 IMPLANT
EVACUATOR SILICONE 100CC (DRAIN) IMPLANT
GLOVE SURG SIGNA 7.5 PF LTX (GLOVE) ×2 IMPLANT
GOWN STRL REUS W/ TWL LRG LVL3 (GOWN DISPOSABLE) ×1 IMPLANT
GOWN STRL REUS W/ TWL XL LVL3 (GOWN DISPOSABLE) ×1 IMPLANT
GOWN STRL REUS W/TWL LRG LVL3 (GOWN DISPOSABLE) ×2
GOWN STRL REUS W/TWL XL LVL3 (GOWN DISPOSABLE) ×2
HEMOSTAT SURGICEL 2X14 (HEMOSTASIS) IMPLANT
NDL HYPO 25X1 1.5 SAFETY (NEEDLE) ×2 IMPLANT
NDL SAFETY ECLIPSE 18X1.5 (NEEDLE) ×1 IMPLANT
NEEDLE HYPO 18GX1.5 SHARP (NEEDLE) ×2
NEEDLE HYPO 25X1 1.5 SAFETY (NEEDLE) ×4 IMPLANT
NS IRRIG 1000ML POUR BTL (IV SOLUTION) ×2 IMPLANT
PACK BASIN DAY SURGERY FS (CUSTOM PROCEDURE TRAY) ×2 IMPLANT
PENCIL SMOKE EVACUATOR (MISCELLANEOUS) ×2 IMPLANT
PIN SAFETY STERILE (MISCELLANEOUS) IMPLANT
SLEEVE SCD COMPRESS KNEE MED (STOCKING) ×2 IMPLANT
SPIKE FLUID TRANSFER (MISCELLANEOUS) IMPLANT
SPONGE T-LAP 18X18 ~~LOC~~+RFID (SPONGE) IMPLANT
SPONGE T-LAP 4X18 ~~LOC~~+RFID (SPONGE) ×2 IMPLANT
SUT MNCRL AB 4-0 PS2 18 (SUTURE) ×4 IMPLANT
SUT VIC AB 3-0 SH 27 (SUTURE) ×2
SUT VIC AB 3-0 SH 27X BRD (SUTURE) ×1 IMPLANT
SYR BULB EAR ULCER 3OZ GRN STR (SYRINGE) ×2 IMPLANT
SYR CONTROL 10ML LL (SYRINGE) ×4 IMPLANT
TOWEL GREEN STERILE FF (TOWEL DISPOSABLE) ×2 IMPLANT
TRACER MAGTRACE VIAL (MISCELLANEOUS) IMPLANT
TUBE CONNECTING 20X1/4 (TUBING) ×2 IMPLANT
YANKAUER SUCT BULB TIP NO VENT (SUCTIONS) ×2 IMPLANT

## 2022-04-14 NOTE — Transfer of Care (Signed)
Immediate Anesthesia Transfer of Care Note ? ?Patient: Brandi Roberts ? ?Procedure(s) Performed: RIGHT AXILLARY SENTINEL LYMPH NODE BIOPSY (Right: Axilla) ? ?Patient Location: PACU ? ?Anesthesia Type:General ? ?Level of Consciousness: awake, sedated and patient cooperative ? ?Airway & Oxygen Therapy: Patient Spontanous Breathing and Patient connected to face mask oxygen ? ?Post-op Assessment: Report given to RN and Post -op Vital signs reviewed and stable ? ?Post vital signs: Reviewed and stable ? ?Last Vitals:  ?Vitals Value Taken Time  ?BP 153/71 04/14/22 1540  ?Temp    ?Pulse 74 04/14/22 1544  ?Resp 12 04/14/22 1544  ?SpO2 100 % 04/14/22 1544  ?Vitals shown include unvalidated device data. ? ?Last Pain:  ?Vitals:  ? 04/14/22 1327  ?TempSrc: Oral  ?PainSc: 0-No pain  ?   ? ?Patients Stated Pain Goal: 3 (04/14/22 1327) ? ?Complications: No notable events documented. ?

## 2022-04-14 NOTE — Anesthesia Procedure Notes (Signed)
Anesthesia Regional Block: Pectoralis block  ? ?Pre-Anesthetic Checklist: , timeout performed,  Correct Patient, Correct Site, Correct Laterality,  Correct Procedure, Correct Position, site marked,  Risks and benefits discussed,  Pre-op evaluation,  At surgeon's request and post-op pain management ? ?Laterality: Right ? ?Prep: Maximum Sterile Barrier Precautions used, chloraprep     ?  ?Needles:  ?Injection technique: Single-shot ? ?Needle Type: Echogenic Stimulator Needle   ? ? ?Needle Length: 9cm  ?Needle Gauge: 21  ? ? ? ?Additional Needles: ? ? ?Procedures:,,,, ultrasound used (permanent image in chart),,    ?Narrative:  ?Start time: 04/14/2022 1:43 PM ?End time: 04/14/2022 1:53 PM ?Injection made incrementally with aspirations every 5 mL. ? ?Performed by: Personally  ?Anesthesiologist: Roderic Palau, MD ? ? ? ? ?

## 2022-04-14 NOTE — Op Note (Signed)
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 ?Post Operative Diagnosis: same ? ?Procedure: Right deep axillary sentinel lymph node biopsy ? ?Surgeon: Dr. [name] ? ?Assistant: Cameron Sprang, MD ? ?Anesthesia: MAC ? ?EBL: 10 cc ? ?Complications: none ? ?Counts: reported as correct x 2 ? ?Findings:  Deep axillary sentinel lymph nodes obtained, enlarged, not hot on mag trace ? ?Indications for procedure:  She is here for another postoperative visit status post right mastectomy for DCIS. She still has a drain in place. A small 1 cm focus of invasive cancer which was ER/PR negative, HER2 positive was found at the time of mastectomy. I had injected mag trace prior to the mastectomy. It is now recommended we do a sentinel node biopsy. This is already planned for 2 days from now. She reports she has been doing well. ? ?Details of the procedure:The patient was taken back to the operating room. The patient was placed in supine position with bilateral SCDs in place. After appropriate anitbiotics were confirmed, a time-out was confirmed and all facts were verified and the right axilla was prepped and draped in the usual sterile fashion. The mag trace had previously been injected during her mastectomy a few weeks prior. An incision was made at the base of the axillary hairline, and dissection was carried down with cautery through the clavipectoral fascia. The mag trace probe was used, but did not identify any hot tissue. A palpable cluster of reactive sentinel lymph nodes were palpated in the deep axillary space. The lymph nodes were elevated with an alice clamp and cautery was used to dissect them free, with clips applied to larger lymphatic structures. The cluster of lymph nodes was removed. It was not hot by mag trace. The specimen was sent as sentinel lymph node to pathology. The clavipectoral fascia was closed with interrupted 3-0 vicryl. The skin was  closed in layers with 3-0 vicryl deep dermal sutures and a running 4-0 subcuticular monocryl suture. Dermabond was applied. The patient was then extubated and brought to the recovery room in stable condition. All sponge, instrument, and needle counts are correct. ? ? ? ? ?

## 2022-04-14 NOTE — Interval H&P Note (Signed)
History and Physical Interval Note: no change in H and P ? ?04/14/2022 ?2:03 PM ? ?Brandi Roberts  has presented today for surgery, with the diagnosis of RIGHT BREAST CANCER.  The various methods of treatment have been discussed with the patient and family. After consideration of risks, benefits and other options for treatment, the patient has consented to  Procedure(s): ?RIGHT AXILLARY SENTINEL LYMPH NODE BIOPSY (Right) as a surgical intervention.  The patient's history has been reviewed, patient examined, no change in status, stable for surgery.  I have reviewed the patient's chart and labs.  Questions were answered to the patient's satisfaction.   ? ? ?[name] ? ? ?

## 2022-04-14 NOTE — Discharge Instructions (Addendum)
Ok to shower starting tomorrow ? ?Ice pack, tylenol, and ibuprofen also for pain ? ?No vigorous activity for one week ?TYLENOL '1000mg'$  given at 1:30 p.m. ?DO NOT RESUME YOUR BLOOD THINNING MEDICINE ELIQUIS UNTIL TOMORROW EVENING ? ?Post Anesthesia Home Care Instructions ? ?Activity: ?Get plenty of rest for the remainder of the day. A responsible individual must stay with you for 24 hours following the procedure.  ?For the next 24 hours, DO NOT: ?-Drive a car ?-Paediatric nurse ?-Drink alcoholic beverages ?-Take any medication unless instructed by your physician ?-Make any legal decisions or sign important papers. ? ?Meals: ?Start with liquid foods such as gelatin or soup. Progress to regular foods as tolerated. Avoid greasy, spicy, heavy foods. If nausea and/or vomiting occur, drink only clear liquids until the nausea and/or vomiting subsides. Call your physician if vomiting continues. ? ?Special Instructions/Symptoms: ?Your throat may feel dry or sore from the anesthesia or the breathing tube placed in your throat during surgery. If this causes discomfort, gargle with warm salt water. The discomfort should disappear within 24 hours. ? ?If you had a scopolamine patch placed behind your ear for the management of post- operative nausea and/or vomiting: ? ?1. The medication in the patch is effective for 72 hours, after which it should be removed.  Wrap patch in a tissue and discard in the trash. Wash hands thoroughly with soap and water. ?2. You may remove the patch earlier than 72 hours if you experience unpleasant side effects which may include dry mouth, dizziness or visual disturbances. ?3. Avoid touching the patch. Wash your hands with soap and water after contact with the patch. ?   Information for Discharge Teaching: ?EXPAREL (bupivacaine liposome injectable suspension)  ? ?Your surgeon or anesthesiologist gave you EXPAREL(bupivacaine) to help control your pain after surgery.  ?EXPAREL is a local anesthetic that  provides pain relief by numbing the tissue around the surgical site. ?EXPAREL is designed to release pain medication over time and can control pain for up to 72 hours. ?Depending on how you respond to EXPAREL, you may require less pain medication during your recovery. ? ?Possible side effects: ?Temporary loss of sensation or ability to move in the area where bupivacaine was injected. ?Nausea, vomiting, constipation ?Rarely, numbness and tingling in your mouth or lips, lightheadedness, or anxiety may occur. ?Call your doctor right away if you think you may be experiencing any of these sensations, or if you have other questions regarding possible side effects. ? ?Follow all other discharge instructions given to you by your surgeon or nurse. Eat a healthy diet and drink plenty of water or other fluids. ? ?If you return to the hospital for any reason within 96 hours following the administration of EXPAREL, it is important for health care providers to know that you have received this anesthetic. A teal colored band has been placed on your arm with the date, time and amount of EXPAREL you have received in order to alert and inform your health care providers. Please leave this armband in place for the full 96 hours following administration, and then you may remove the band.  ?

## 2022-04-14 NOTE — Anesthesia Postprocedure Evaluation (Signed)
Anesthesia Post Note ? ?Patient: Brandi Roberts ? ?Procedure(s) Performed: RIGHT AXILLARY SENTINEL LYMPH NODE BIOPSY (Right: Axilla) ? ?  ? ?Patient location during evaluation: PACU ?Anesthesia Type: General and Regional ?Level of consciousness: awake and alert ?Pain management: pain level controlled ?Vital Signs Assessment: post-procedure vital signs reviewed and stable ?Respiratory status: spontaneous breathing, nonlabored ventilation and respiratory function stable ?Cardiovascular status: blood pressure returned to baseline and stable ?Postop Assessment: no apparent nausea or vomiting ?Anesthetic complications: no ? ? ?No notable events documented. ? ?Last Vitals:  ?Vitals:  ? 04/14/22 1612 04/14/22 1708  ?BP: (!) 165/69 (!) 163/62  ?Pulse: 68 63  ?Resp: (!) 9 16  ?Temp:  36.5 ?C  ?SpO2: 97% 94%  ?  ?Last Pain:  ?Vitals:  ? 04/14/22 1645  ?TempSrc:   ?PainSc: 0-No pain  ? ? ?  ?  ?  ?  ?  ?  ? ?Praneel Haisley,W. EDMOND ? ? ? ? ?

## 2022-04-14 NOTE — Anesthesia Preprocedure Evaluation (Addendum)
Anesthesia Evaluation  ?Patient identified by MRN, date of birth, ID band ?Patient awake ? ? ? ?Reviewed: ?Allergy & Precautions, H&P , NPO status , Patient's Chart, lab work & pertinent test results, reviewed documented beta blocker date and time  ? ?Airway ?Mallampati: II ? ?TM Distance: >3 FB ?Neck ROM: Full ? ? ? Dental ?no notable dental hx. ?(+) Teeth Intact, Dental Advisory Given ?  ?Pulmonary ?asthma , former smoker,  ?  ?Pulmonary exam normal ?breath sounds clear to auscultation ? ? ? ? ? ? Cardiovascular ?hypertension, Pt. on medications and Pt. on home beta blockers ? ?Rhythm:Regular Rate:Normal ? ? ?  ?Neuro/Psych ?negative neurological ROS ? negative psych ROS  ? GI/Hepatic ?Neg liver ROS, GERD  Controlled,  ?Endo/Other  ?diabetes, Type 2, Oral Hypoglycemic Agents ? Renal/GU ?negative Renal ROS  ?negative genitourinary ?  ?Musculoskeletal ? ? Abdominal ?  ?Peds ? Hematology ?negative hematology ROS ?(+)   ?Anesthesia Other Findings ? ? Reproductive/Obstetrics ?negative OB ROS ? ?  ? ? ? ? ? ? ? ? ? ? ? ? ? ?  ?  ? ? ? ? ? ? ? ?Anesthesia Physical ?Anesthesia Plan ? ?ASA: 2 ? ?Anesthesia Plan: General  ? ?Post-op Pain Management: Regional block* and Tylenol PO (pre-op)*  ? ?Induction: Intravenous ? ?PONV Risk Score and Plan: 4 or greater and Ondansetron, Dexamethasone and Treatment may vary due to age or medical condition ? ?Airway Management Planned: LMA ? ?Additional Equipment:  ? ?Intra-op Plan:  ? ?Post-operative Plan: Extubation in OR ? ?Informed Consent: I have reviewed the patients History and Physical, chart, labs and discussed the procedure including the risks, benefits and alternatives for the proposed anesthesia with the patient or authorized representative who has indicated his/her understanding and acceptance.  ? ? ? ?Dental advisory given ? ?Plan Discussed with: CRNA ? ?Anesthesia Plan Comments:   ? ? ? ? ? ? ?Anesthesia Quick Evaluation ? ?

## 2022-04-15 NOTE — Addendum Note (Signed)
Addendum  created 04/15/22 1005 by Verita Lamb, CRNA  ? Charge Capture section accepted  ?  ?

## 2022-04-18 ENCOUNTER — Inpatient Hospital Stay: Payer: Medicare Other | Attending: Hematology & Oncology

## 2022-04-18 ENCOUNTER — Inpatient Hospital Stay: Payer: Medicare Other | Admitting: Hematology & Oncology

## 2022-04-18 ENCOUNTER — Encounter (HOSPITAL_BASED_OUTPATIENT_CLINIC_OR_DEPARTMENT_OTHER): Payer: Self-pay | Admitting: Surgery

## 2022-04-18 ENCOUNTER — Encounter: Payer: Self-pay | Admitting: *Deleted

## 2022-04-18 VITALS — BP 173/65 | HR 60 | Temp 98.2°F | Resp 20 | Ht 66.0 in | Wt 209.1 lb

## 2022-04-18 DIAGNOSIS — I10 Essential (primary) hypertension: Secondary | ICD-10-CM | POA: Diagnosis not present

## 2022-04-18 DIAGNOSIS — C779 Secondary and unspecified malignant neoplasm of lymph node, unspecified: Secondary | ICD-10-CM | POA: Insufficient documentation

## 2022-04-18 DIAGNOSIS — Z853 Personal history of malignant neoplasm of breast: Secondary | ICD-10-CM | POA: Diagnosis not present

## 2022-04-18 DIAGNOSIS — Z7901 Long term (current) use of anticoagulants: Secondary | ICD-10-CM | POA: Insufficient documentation

## 2022-04-18 DIAGNOSIS — E119 Type 2 diabetes mellitus without complications: Secondary | ICD-10-CM | POA: Insufficient documentation

## 2022-04-18 DIAGNOSIS — Z87891 Personal history of nicotine dependence: Secondary | ICD-10-CM | POA: Diagnosis not present

## 2022-04-18 DIAGNOSIS — Z79899 Other long term (current) drug therapy: Secondary | ICD-10-CM | POA: Diagnosis not present

## 2022-04-18 DIAGNOSIS — C50911 Malignant neoplasm of unspecified site of right female breast: Secondary | ICD-10-CM | POA: Insufficient documentation

## 2022-04-18 DIAGNOSIS — K219 Gastro-esophageal reflux disease without esophagitis: Secondary | ICD-10-CM | POA: Diagnosis not present

## 2022-04-18 DIAGNOSIS — Z7189 Other specified counseling: Secondary | ICD-10-CM | POA: Diagnosis not present

## 2022-04-18 DIAGNOSIS — G629 Polyneuropathy, unspecified: Secondary | ICD-10-CM | POA: Insufficient documentation

## 2022-04-18 DIAGNOSIS — Z9011 Acquired absence of right breast and nipple: Secondary | ICD-10-CM

## 2022-04-18 DIAGNOSIS — Z86718 Personal history of other venous thrombosis and embolism: Secondary | ICD-10-CM | POA: Diagnosis not present

## 2022-04-18 DIAGNOSIS — Z171 Estrogen receptor negative status [ER-]: Secondary | ICD-10-CM | POA: Insufficient documentation

## 2022-04-18 LAB — CMP (CANCER CENTER ONLY)
ALT: 8 U/L (ref 0–44)
AST: 12 U/L — ABNORMAL LOW (ref 15–41)
Albumin: 4 g/dL (ref 3.5–5.0)
Alkaline Phosphatase: 56 U/L (ref 38–126)
Anion gap: 7 (ref 5–15)
BUN: 19 mg/dL (ref 8–23)
CO2: 31 mmol/L (ref 22–32)
Calcium: 9.8 mg/dL (ref 8.9–10.3)
Chloride: 100 mmol/L (ref 98–111)
Creatinine: 1.1 mg/dL — ABNORMAL HIGH (ref 0.44–1.00)
GFR, Estimated: 55 mL/min — ABNORMAL LOW (ref >60)
Glucose, Bld: 117 mg/dL — ABNORMAL HIGH (ref 70–99)
Potassium: 3.7 mmol/L (ref 3.5–5.1)
Sodium: 138 mmol/L (ref 135–145)
Total Bilirubin: 0.4 mg/dL (ref 0.3–1.2)
Total Protein: 7.4 g/dL (ref 6.5–8.1)

## 2022-04-18 LAB — CBC WITH DIFFERENTIAL (CANCER CENTER ONLY)
Abs Immature Granulocytes: 0.06 10*3/uL (ref 0.00–0.07)
Basophils Absolute: 0 10*3/uL (ref 0.0–0.1)
Basophils Relative: 0 %
Eosinophils Absolute: 0.1 10*3/uL (ref 0.0–0.5)
Eosinophils Relative: 1 %
HCT: 31.4 % — ABNORMAL LOW (ref 36.0–46.0)
Hemoglobin: 10.3 g/dL — ABNORMAL LOW (ref 12.0–15.0)
Immature Granulocytes: 1 %
Lymphocytes Relative: 19 %
Lymphs Abs: 2 10*3/uL (ref 0.7–4.0)
MCH: 30.7 pg (ref 26.0–34.0)
MCHC: 32.8 g/dL (ref 30.0–36.0)
MCV: 93.7 fL (ref 80.0–100.0)
Monocytes Absolute: 0.8 10*3/uL (ref 0.1–1.0)
Monocytes Relative: 8 %
Neutro Abs: 7.5 10*3/uL (ref 1.7–7.7)
Neutrophils Relative %: 71 %
Platelet Count: 277 10*3/uL (ref 150–400)
RBC: 3.35 MIL/uL — ABNORMAL LOW (ref 3.87–5.11)
RDW: 12.8 % (ref 11.5–15.5)
WBC Count: 10.5 10*3/uL (ref 4.0–10.5)
nRBC: 0 % (ref 0.0–0.2)

## 2022-04-18 LAB — LACTATE DEHYDROGENASE: LDH: 189 U/L (ref 98–192)

## 2022-04-18 LAB — SURGICAL PATHOLOGY

## 2022-04-18 NOTE — Progress Notes (Signed)
Initial RN Navigator Patient Visit ? 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 ?Met with patient prior to their visit with MD. Hanley Seamen patient "Your Patient Navigator" handout which explains my role, areas in which I am able to help, and all the contact information for myself and the office. Also gave patient MD and Navigator business card. Reviewed with patient the general overview of expected course after initial diagnosis and time frame for all steps to be completed. ? ?New patient packet given to patient which includes: orientation to office and staff; campus directory; education on My Chart and Advance Directives; and patient centered education on breast cancer.  ? ?Patient comes in with her son and daughter. She has a history of radiation and chemo treatment about 20 years ago. She had a mastectomy this time in part because she couldn't have radiation, and she didn't want any further treatment. She is HER2+ and this afternoon one of two SLN showed metastatic disease.  ? ?Patient completed visit with Dr. Marin Olp. At this time patient is pretty certain she doesn't want any further treatment. Dr Marin Olp requests her records from 2003. We have made the request.  ? ?Will follow up with patient needs once note is dictated and orders placed.  ? ?Patient understands all follow up procedures and expectations. They have my number to reach out for any further clarification or additional needs.  ? ?Oncology Nurse Navigator Documentation ? ? ?  04/18/2022  ? 11:00 AM  ?Oncology Nurse Navigator Flowsheets  ?Navigator Follow Up Date: 04/19/2022  ?Navigator Follow Up Reason: Appointment Review  ?Navigator Location [facility]  ?Navigator Encounter Type Initial MedOnc  ?Treatment Initiated Date 03/22/2022  ?Patient Visit Type MedOnc  ?Treatment Phase Active Tx  ?Barriers/Navigation Needs Coordination of Care;Education  ?Education Newly Diagnosed Cancer Education;Pain/ Symptom  Management  ?Interventions Education;Psycho-Social Support  ?Acuity Level 2-Minimal Needs (1-2 Barriers Identified)  ?Education Method Verbal;Written  ?Support Groups/Services Friends and Family  ?Time Spent with Patient 45  ?  ?

## 2022-04-19 ENCOUNTER — Encounter: Payer: Self-pay | Admitting: Hematology & Oncology

## 2022-04-19 ENCOUNTER — Encounter: Payer: Self-pay | Admitting: *Deleted

## 2022-04-19 DIAGNOSIS — C50911 Malignant neoplasm of unspecified site of right female breast: Secondary | ICD-10-CM | POA: Insufficient documentation

## 2022-04-19 DIAGNOSIS — Z7189 Other specified counseling: Secondary | ICD-10-CM

## 2022-04-19 HISTORY — DX: Other specified counseling: Z71.89

## 2022-04-19 HISTORY — DX: Malignant neoplasm of unspecified site of right female breast: C50.911

## 2022-04-19 NOTE — Progress Notes (Signed)
Referral MD ? 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 ?Chief Complaint  ?Patient presents with  ? New Patient (Initial Visit)  ?  Breast cancer right breast.  ?: I have breast cancer again. ? ?HPI: [name] is a very nice 66 year old Afro-American female.  She actually has history of breast cancer of the right breast.  This is probably about 11 years ago.  She did receive adjuvant radiation therapy and chemotherapy.  Unfortunately, I do not have any of the records from that event. ? ?She now has a new breast cancer.  She underwent a mammogram which showed a abnormality in the right breast.  This was done in November.  There was some possible asymmetry. ? ?A diagnostic mammogram was then done.  This was done on 12/06/2021.  This did show some calcifications in the upper outer quadrant of the right breast.  This measured 1.3 cm. ? ?She then had a biopsy done.  This was done on 12/22/2021.  The pathology report ([id number]) showed a high-grade ductal carcinoma in situ.  The tumor was estrogen negative and progesterone negative. ? ?She had a breast MRI done on 01/19/2022.  This showed 3 areas of non-mass enhancement in the upper outer quadrant of the right breast. ? ?She underwent a mastectomy on 03/22/2022.  The pathology report ([id number]) showed an invasive ductal carcinoma that measured 1 cm.  And there was DCIS.  All margins were negative.  At the time, there were no lymph nodes that were submitted. ? ?She subsequently had a sentinel node biopsy on 04/14/2022.  The pathology report ([id number]) did show 1 lymph node that did contain metastatic breast cancer.  This was negative for extracapsular extension. ? ?As such, she is a stage IIa ([id number]) breast cancer. ? ?Unfortunately, the tumor is also HER2 positive. ? ?Ms. [name] came in with her son and daughter.  I had a very long talk with him.  Unfortunate, I do not have the result back of the  lymph node biopsy when I talk to them. ? ?She clearly needs some form of adjuvant systemic therapy.  Given that her tumor is HER2 positive, I thought that we could consider her for Kadcyla. ? ?She said that she would be very reluctant to take chemotherapy.  She had a tough time with chemotherapy the first time she had breast cancer. ? ?I think that if there was no lymph node positive, we concern to consider Kadcyla.  However, I think that given the positive lymph node, we may have to think about chemotherapy along with anti-HER2 therapy. ? ?She feels okay.  She has had no problems with cough.  There is been no bony pain.  She has had no nausea or vomiting.  There is been no change in bowel or bladder habits. ? ?There is no history of breast cancer in the family. ? ?She does not smoke.  She does not drink. ? ?Overall, I would say performance status is probably ECOG 1. ? ?She is subsequently underwent a mastectomy.  This was done on 03/22/2022. ? ? ? ?Past Medical History:  ?Diagnosis Date  ? Acid reflux   ? Asthma   ? Breast cancer ([place])   ? Diabetes mellitus without complication ([place])   ? DVT (deep venous thrombosis) ([place])   ? Hypertension   ? Neuropathy   ? Shingles   ?: ? ? ?Past Surgical History:  ?Procedure Laterality Date  ? AXILLARY SENTINEL  NODE BIOPSY Right 04/14/2022  ? Procedure: RIGHT AXILLARY SENTINEL LYMPH NODE BIOPSY;  Surgeon: [name], MD;  Location: [facility];  Service: General;  Laterality: Right;  ? BREAST SURGERY    ? CHOLECYSTECTOMY    ? TOTAL MASTECTOMY Right 03/22/2022  ? Procedure: RIGHT TOTAL MASTECTOMY;  Surgeon: [name], MD;  Location: [facility];  Service: General;  Laterality: Right;  ?: ? ? ?Current Outpatient Medications:  ?  albuterol (VENTOLIN HFA) 108 (90 Base) MCG/ACT inhaler, Inhale 2 puffs into the lungs every 6 (six) hours as needed., Disp: , Rfl:  ?  ALPRAZolam (XANAX) 0.5 MG tablet, Take 0.5 mg by mouth 2 (two) times daily as  needed., Disp: , Rfl:  ?  amLODipine (NORVASC) 10 MG tablet, Take 10 mg by mouth daily., Disp: , Rfl:  ?  atorvastatin (LIPITOR) 10 MG tablet, Take 5 mg by mouth at bedtime., Disp: , Rfl:  ?  benazepril (LOTENSIN) 40 MG tablet, Take 40 mg by mouth daily., Disp: , Rfl:  ?  doxazosin (CARDURA) 2 MG tablet, Take 2 mg by mouth daily as needed., Disp: , Rfl:  ?  ELIQUIS 5 MG TABS tablet, Take 5 mg by mouth 2 (two) times daily., Disp: , Rfl:  ?  gabapentin (NEURONTIN) 600 MG tablet, Take 600 mg by mouth 2 (two) times daily., Disp: , Rfl:  ?  hydrALAZINE (APRESOLINE) 50 MG tablet, Take 50 mg by mouth 2 (two) times daily., Disp: , Rfl:  ?  metFORMIN (GLUCOPHAGE) 1000 MG tablet, Take 1,000 mg by mouth 2 (two) times daily., Disp: , Rfl:  ?  metoprolol succinate (TOPROL-XL) 100 MG 24 hr tablet, Take 100 mg by mouth daily., Disp: , Rfl:  ?  triamterene-hydrochlorothiazide (MAXZIDE) 75-50 MG per tablet, Take 1 tablet by mouth daily., Disp: , Rfl:  ?  Vitamin D, Ergocalciferol, (DRISDOL) 1.25 MG (50000 UNIT) CAPS capsule, Take 50,000 Units by mouth once a week., Disp: , Rfl:  ?  HYDROcodone-acetaminophen (NORCO/VICODIN) 5-325 MG tablet, Take 2 tablets by mouth 2 (two) times daily. (Patient not taking: Reported on 04/18/2022), Disp: , Rfl:  ?  ondansetron (ZOFRAN-ODT) 4 MG disintegrating tablet, Take 1 tablet (4 mg total) by mouth every 8 (eight) hours as needed for nausea or vomiting. (Patient not taking: Reported on 04/18/2022), Disp: 20 tablet, Rfl: 0 ?  oxyCODONE (OXY IR/ROXICODONE) 5 MG immediate release tablet, Take 1 tablet (5 mg total) by mouth every 6 (six) hours as needed for moderate pain, severe pain or breakthrough pain. (Patient not taking: Reported on 04/18/2022), Disp: 25 tablet, Rfl: 0: ? ?: ? ? ?Allergies  ?Allergen Reactions  ? Levaquin [Levofloxacin] Nausea And Vomiting and Other (See Comments)  ?  dizziness  ?: ? ? ?Family History  ?Problem Relation Age of Onset  ? Breast cancer Neg Hx   ?: ? ? ?Social History   ? ?Socioeconomic History  ? Marital status: Married  ?  Spouse name: Not on file  ? Number of children: Not on file  ? Years of education: Not on file  ? Highest education level: Not on file  ?Occupational History  ? Not on file  ?Tobacco Use  ? Smoking status: Former  ?  Packs/day: 0.50  ?  Years: 15.00  ?  Pack years: 7.50  ?  Types: Cigarettes  ?  Quit date: 2018  ?  Years since quitting: 5.3  ? Smokeless tobacco: Never  ?Vaping Use  ? Vaping Use: Never used  ?  Substance and Sexual Activity  ? Alcohol use: No  ? Drug use: No  ? Sexual activity: Not on file  ?Other Topics Concern  ? Not on file  ?Social History Narrative  ? Not on file  ? ?Social Determinants of Health  ? ?Financial Resource Strain: Not on file  ?Food Insecurity: Not on file  ?Transportation Needs: Not on file  ?Physical Activity: Not on file  ?Stress: Not on file  ?Social Connections: Not on file  ?Intimate Partner Violence: Not on file  ?: ? ?Review of Systems  ?Constitutional: Negative.   ?HENT: Negative.    ?Eyes: Negative.   ?Respiratory: Negative.  Negative for cough.   ?Cardiovascular: Negative.   ?Gastrointestinal: Negative.   ?Genitourinary: Negative.   ?Musculoskeletal: Negative.   ?Skin: Negative.   ?Neurological: Negative.   ?Endo/Heme/Allergies: Negative.   ?Psychiatric/Behavioral: Negative.    ? ? ?Exam: ?_0 @ ?Physical Exam ?Vitals reviewed.  ?Constitutional:   ?   Comments: Breast exam shows left breast with no masses, edema or erythema.  There is no left axillary adenopathy.  Right chest wall shows healing mastectomy.  There is no chest wall nodules.  There is no exudate from the incision.  She does have a hematoma in the inner aspect of the mastectomy.  She has no obvious tenderness or fullness in the right axilla.  ?HENT:  ?   Head: Normocephalic and atraumatic.  ?Eyes:  ?   Pupils: Pupils are equal, round, and reactive to light.  ?Cardiovascular:  ?   Rate and Rhythm: Normal rate and regular rhythm.  ?   Heart sounds:  Normal heart sounds.  ?Pulmonary:  ?   Effort: Pulmonary effort is normal.  ?   Breath sounds: Normal breath sounds.  ?Abdominal:  ?   General: Bowel sounds are normal.  ?   Palpations: Abdomen is soft.  ?Muscu

## 2022-04-19 NOTE — Progress Notes (Signed)
Prior auth not needed for Echo.  ? ?Called echo department to schedule patient's echo. They will reach out to patient and schedule directly with her.  ? ?Medical records from 2003 obtained. Copy given to Dr Marin Olp and copy placed in scan bin.  ? ?Oncology Nurse Navigator Documentation ? ? ?  04/19/2022  ?  2:00 PM  ?Oncology Nurse Navigator Flowsheets  ?Navigator Follow Up Date: 04/21/2022  ?Navigator Follow Up Reason: Appointment Review  ?Navigator Location [facility]  ?Navigator Encounter Type Appt/Treatment Plan Review  ?Patient Visit Type MedOnc  ?Treatment Phase Active Tx  ?Barriers/Navigation Needs Coordination of Care;Education  ?Interventions Coordination of Care  ?Acuity Level 2-Minimal Needs (1-2 Barriers Identified)  ?Coordination of Care Other  ?Support Groups/Services Friends and Family  ?Time Spent with Patient 30  ?  ? ? ?

## 2022-04-20 ENCOUNTER — Telehealth: Payer: Self-pay | Admitting: Hematology & Oncology

## 2022-04-20 NOTE — Telephone Encounter (Signed)
I called Brandi Roberts this afternoon.  I asked her if Dr. Ninfa Linden had called her about the results of the sentinel node biopsy.  He did.  She had 1 positive lymph node.  As such, this increases her stage to stage II. ? ?I told her that given the fact that she had a positive lymph node under the axilla, that chemotherapy in addition to the Kadcyla would not be a bad idea and would probably be recommended. ? ?She still is quite reluctant to do chemotherapy.  She says she just got very sick with chemotherapy that she had 20 years ago.  She had Adriamycin and Cytoxan.  She had 4 cycles. ? ?I told her that we would be using a totally different chemotherapy.  I would use Taxotere.  I think this would be reasonable for her.  With the Taxotere, I would consider Kadcyla also. ? ?She really is reluctant to do chemotherapy just because of what she experienced previously. ? ?She will talk to her family.  I told her I would respect what ever decision she makes.  If she does not want the chemotherapy, then I would clearly do the Kadcyla for her. ? ?I told her that this was a totally different breast cancer than what she had previously. ? ?I told her that she needed the mastectomy because she had had past radiation therapy and that she can have no further radiation therapy to the right breast. ? ?Again, I know this is very difficult for her.  I know that she has a lot of reservations about chemotherapy.  I just want to make sure that we do the right thing for her and give her the best treatment that we will improve her odds of cure. ? ?She will get back with [place] after she talks to her family. ? ?[name], MD ?

## 2022-04-21 ENCOUNTER — Encounter: Payer: Self-pay | Admitting: *Deleted

## 2022-04-21 NOTE — Progress Notes (Signed)
Patient is scheduled for ECHO on 05/04/22.  ? ?Dr Marin Olp spoke to her yesterday about her positive LN biopsy and his recommendations for treatment. She and her family will discuss and let this office know what her decision is. ? ?Oncology Nurse Navigator Documentation ? ? ?  04/21/2022  ?  9:45 AM  ?Oncology Nurse Navigator Flowsheets  ?Navigator Follow Up Date: 05/04/2022  ?Navigator Follow Up Reason: Radiology  ?Navigator Location [facility]  ?Navigator Encounter Type Appt/Treatment Plan Review  ?Patient Visit Type MedOnc  ?Treatment Phase Active Tx  ?Barriers/Navigation Needs Coordination of Care;Education  ?Interventions None Required  ?Acuity Level 2-Minimal Needs (1-2 Barriers Identified)  ?Support Groups/Services Friends and Family  ?Time Spent with Patient 15  ?  ?

## 2022-04-29 NOTE — Progress Notes (Signed)
All lymph node biopsy patients have to have preop PT evaluation for risks of arm swelling apparently ?

## 2022-05-04 ENCOUNTER — Ambulatory Visit (HOSPITAL_COMMUNITY): Payer: Medicare Other

## 2022-05-12 ENCOUNTER — Ambulatory Visit (HOSPITAL_COMMUNITY): Admission: RE | Admit: 2022-05-12 | Payer: Medicare Other | Source: Ambulatory Visit

## 2022-05-16 ENCOUNTER — Encounter: Payer: Self-pay | Admitting: *Deleted

## 2022-05-16 NOTE — Progress Notes (Signed)
Patient has cancelled 2 ECHO appointments and never notified this office of what her plan is in regards to treatment.   Called and spoke to patient. She has had difficulty with wound healing the past few weeks. She states she had to cancel both appointment made due to bandaging that was present from wound complications. She states she had a hematoma that developed an infection and her incision had to be reopened. She sees the surgeon again this week. She has the number for cardiovascular to reschedule the appointment once she no longer has a dressing present.   She believes she wants to move forward with the [place] and will continue to try to schedule the ECHO in preparation for this. She wants her incision issues to be resolved first. She agrees for me to call her back in two weeks to check in with her and see how things are progressing.   Oncology Nurse Navigator Documentation     05/16/2022    2:30 PM  Oncology Nurse Navigator Flowsheets  Navigator Follow Up Date: 05/30/2022  Navigator Follow Up Reason: Patient Call  Navigator Location [facility]  Navigator Encounter Type Telephone  Patient Visit Type MedOnc  Treatment Phase Active Tx  Barriers/Navigation Needs Coordination of Care;Education  Education Other  Interventions Education;Psycho-Social Support  Acuity Level 2-Minimal Needs (1-2 Barriers Identified)  Education Method Verbal  Support Groups/Services Friends and Family  Time Spent with Patient 15

## 2022-05-30 ENCOUNTER — Ambulatory Visit: Payer: Medicare Other | Attending: Surgery | Admitting: Physical Therapy

## 2022-05-30 ENCOUNTER — Encounter: Payer: Self-pay | Admitting: *Deleted

## 2022-05-30 DIAGNOSIS — M25612 Stiffness of left shoulder, not elsewhere classified: Secondary | ICD-10-CM | POA: Insufficient documentation

## 2022-05-30 DIAGNOSIS — C50911 Malignant neoplasm of unspecified site of right female breast: Secondary | ICD-10-CM | POA: Insufficient documentation

## 2022-05-30 DIAGNOSIS — R293 Abnormal posture: Secondary | ICD-10-CM | POA: Insufficient documentation

## 2022-05-30 DIAGNOSIS — M25611 Stiffness of right shoulder, not elsewhere classified: Secondary | ICD-10-CM | POA: Insufficient documentation

## 2022-05-30 NOTE — Therapy (Incomplete)
OUTPATIENT PHYSICAL THERAPY BREAST CANCER POST OP FOLLOW UP   Patient Name: Brandi Roberts MRN: [id number] DOB:Dec 29, 1955, 66 y.o., female Today's Date: 05/30/2022    Past Medical History:  Diagnosis Date   Acid reflux    Asthma    Breast cancer ([place])    Diabetes mellitus without complication ([place])    DVT (deep venous thrombosis) ([place])    Goals of care, counseling/discussion 04/19/2022   Hypertension    Neuropathy    Shingles    Stage II carcinoma of breast, ER-, right ([place]) 04/19/2022   Past Surgical History:  Procedure Laterality Date   AXILLARY SENTINEL NODE BIOPSY Right 04/14/2022   Procedure: RIGHT AXILLARY SENTINEL LYMPH NODE BIOPSY;  Surgeon: [name], MD;  Location: [facility];  Service: General;  Laterality: Right;   BREAST SURGERY     CHOLECYSTECTOMY     TOTAL MASTECTOMY Right 03/22/2022   Procedure: RIGHT TOTAL MASTECTOMY;  Surgeon: [name], MD;  Location: [facility];  Service: General;  Laterality: Right;   Patient Active Problem List   Diagnosis Date Noted   Stage II carcinoma of breast, ER-, right ([place]) 04/19/2022   Goals of care, counseling/discussion 04/19/2022   S/P mastectomy, right 03/22/2022   DVT (deep venous thrombosis) ([place]) 03/09/2022    PCP: Marjo Bicker, MD  REFERRING PROVIDER:  [name], MD  REFERRING DIAG: [id number] (ICD-10-CM) - Malignant neoplasm of right female breast, unspecified estrogen receptor status, unspecified site of breast ([place])  THERAPY DIAG:  No diagnosis found.  Rationale for Evaluation and Treatment Rehabilitation  ONSET DATE: 03/22/22  SUBJECTIVE:                                                                                                                                                                                           SUBJECTIVE STATEMENT: ***  PERTINENT HISTORY:  Patient was diagnosed on 12/31/21 with right grade 2 DCIS. It measures 1 cm. It  is HER2+ , ER/PR- with a Ki67 of 40%. Had R breast cancer and R lumpectomy and SLNB 10 years ago in 2003 previously with chemo and radiation. Status post R mastectomy and SLNB (1/2) on 04/14/22  PATIENT GOALS:  Reassess how my recovery is going related to arm function, pain, and swelling.  PAIN:  Are you having pain? {OPRCPAIN:27236}  PRECAUTIONS: Recent Surgery, {RIGHT/LEFT:21944} UE Lymphedema risk, {Therapy precautions:24002}  ACTIVITY LEVEL / LEISURE: ***   OBJECTIVE:   PATIENT SURVEYS:  QUICK DASH: ***  OBSERVATIONS:  ***  POSTURE:  ***  UPPER EXTREMITY AROM/PROM:   A/PROM RIGHT  04/12/2022    Shoulder extension 44  Shoulder flexion 100  Shoulder abduction 84  Shoulder internal rotation 25  Shoulder external rotation 62                          (Blank rows = not tested)   A/PROM LEFT  04/12/2022  Shoulder extension 66  Shoulder flexion 134  Shoulder abduction 122  Shoulder internal rotation 80  Shoulder external rotation 78                          (Blank rows = not tested)       UPPER EXTREMITY STRENGTH: not tested due to recent surgery     LYMPHEDEMA ASSESSMENTS:    LANDMARK RIGHT  04/12/2022  10 cm proximal to olecranon process 33.9  Olecranon process 26.5  10 cm proximal to ulnar styloid process 21.8  Just proximal to ulnar styloid process 17  Across hand at thumb web space 20.2  At base of 2nd digit 6.6  (Blank rows = not tested)   LANDMARK LEFT  04/12/2022  10 cm proximal to olecranon process 32.4  Olecranon process 26  10 cm proximal to ulnar styloid process 22.1  Just proximal to ulnar styloid process 17  Across hand at thumb web space 20  At base of 2nd digit 6.3  (Blank rows = not tested)       Surgery type/Date: 04/14/22 - R mastectomy and SLNB (1/2) - 10 yrs prior had R lump and SLNB followed by chemo and radiation Number of lymph nodes removed: 2 - 1 positive Current/past treatment (chemo, radiation, hormone therapy): 10 yrs ago had  chemo and radiation Other symptoms:  Heaviness/tightness {yes/no:20286} Pain {yes/no:20286} Pitting edema {yes/no:20286} Infections {yes/no:20286} Decreased scar mobility {yes/no:20286} Stemmer sign {yes/no:20286}   PATIENT EDUCATION:  Education details: *** Person educated: {Person educated:25204} Education method: {Education Method:25205} Education comprehension: {Education Comprehension:25206}   HOME EXERCISE PROGRAM:  Reviewed previously given post op HEP. ***  ASSESSMENT:  CLINICAL IMPRESSION: ***  Pt will benefit from skilled therapeutic intervention to improve on the following deficits: Decreased knowledge of precautions, impaired UE functional use, pain, decreased ROM, postural dysfunction.   PT treatment/interventions: ADL/Self care home management, {rehab planned interventions:25118::"Therapeutic exercises","Therapeutic activity","Neuromuscular re-education","Balance training","Gait training","Patient/Family education","Joint mobilization"}     GOALS: Goals reviewed with patient? {yes/no:20286}  LONG TERM GOALS:  (STG=LTG)  GOALS Name Target Date  Goal status  1 Pt will demonstrate she has regained full shoulder ROM and function post operatively compared to baselines.  Baseline: {follow up:25551} {GOALSTATUS:25110}  2  {follow up:25551} {GOALSTATUS:25110}  3  {follow up:25551} {GOALSTATUS:25110}  4  {follow up:25551} {GOALSTATUS:25110}     PLAN: PT FREQUENCY/DURATION: ***  PLAN FOR NEXT SESSION: ***   [facility]  [address], [address]  [place]  [address]  [phone]  After Breast Cancer Class It is recommended you attend the ABC class to be educated on lymphedema risk reduction. This class is free of charge and lasts for 1 hour. It is a 1-time class. You will need to download the Webex app either on your phone or computer. We will send you a link the night before or the morning of the class. You should be able to click  on that link to join the class. This is not a confidential class. You don't have to turn your camera on, but other participants may be able to see your email address.  Scar massage You can  begin gentle scar massage to you incision sites. Gently place one hand on the incision and move the skin (without sliding on the skin) in various directions. Do this for a few minutes and then you can gently massage either coconut oil or vitamin E cream into the scars.  Compression garment You should continue wearing your compression bra until you feel like you no longer have swelling.  Home exercise Program Continue doing the exercises you were given until you feel like you can do them without feeling any tightness at the end.   Walking Program Studies show that 30 minutes of walking per day (fast enough to elevate your heart rate) can significantly reduce the risk of a cancer recurrence. If you can't walk due to other medical reasons, we encourage you to find another activity you could do (like a stationary bike or water exercise).  Posture After breast cancer surgery, people frequently sit with rounded shoulders posture because it puts their incisions on slack and feels better. If you sit like this and scar tissue forms in that position, you can become very tight and have pain sitting or standing with good posture. Try to be aware of your posture and sit and stand up tall to heal properly.  Follow up PT: It is recommended you return every 3 months for the first 3 years following surgery to be assessed on the SOZO machine for an L-Dex score. This helps prevent clinically significant lymphedema in 95% of patients. These follow up screens are 10 minute appointments that you are not billed for.  [employer], PT 05/30/2022, 11:27 AM

## 2022-05-30 NOTE — Progress Notes (Signed)
Called patient to follow up on her incisional complications, ECHO and follow up with this office for treatment.   No contact made and voicemail is full.   Oncology Nurse Navigator Documentation     05/30/2022    2:45 PM  Oncology Nurse Navigator Flowsheets  Navigator Follow Up Date: 05/31/2022  Navigator Follow Up Reason: Patient Call  Navigator Location [facility]  Navigator Encounter Type Telephone  Telephone Outgoing Call  Patient Visit Type MedOnc  Treatment Phase Active Tx  Barriers/Navigation Needs Coordination of Care;Education  Interventions Other  Acuity Level 2-Minimal Needs (1-2 Barriers Identified)  Support Groups/Services Friends and Family  Time Spent with Patient 15

## 2022-06-01 ENCOUNTER — Encounter: Payer: Self-pay | Admitting: *Deleted

## 2022-06-01 NOTE — Progress Notes (Signed)
Patient's incision complications have resolved and she is ready to move onto scheduling her ECHO and follow up with Dr Marin Olp.   Called [facility] [phone]) and scheduled her ECHO for 06/15/2022.  Follow up appointment with Dr Marin Olp scheduled for 06/13/22.  Patient is aware of both appointments. She knows date, time and location.  Oncology Nurse Navigator Documentation     06/01/2022   11:45 AM  Oncology Nurse Navigator Flowsheets  Navigator Follow Up Date: 06/13/2022  Navigator Follow Up Reason: Follow-up Appointment  Navigator Location [facility]  Navigator Encounter Type Telephone;Appt/Treatment Plan Review  Telephone Outgoing Call;Education;Appt Confirmation/Clarification  Patient Visit Type MedOnc  Treatment Phase Active Tx  Barriers/Navigation Needs Coordination of Care;Education  Interventions Coordination of Care;Education;Psycho-Social Support  Acuity Level 2-Minimal Needs (1-2 Barriers Identified)  Coordination of Care Other  Education Method Verbal;Teach-back  Support Groups/Services Friends and Family  Time Spent with Patient 41

## 2022-06-09 ENCOUNTER — Other Ambulatory Visit: Payer: Medicare Other

## 2022-06-10 ENCOUNTER — Encounter: Payer: Self-pay | Admitting: *Deleted

## 2022-06-10 NOTE — Progress Notes (Signed)
Patient calling to see if she can reschedule to an appointment later in the week after her ECHO and at a time where her daughter can come with her. Appointment rescheduled to 06/16/22.   Oncology Nurse Navigator Documentation     06/10/2022   10:45 AM  Oncology Nurse Navigator Flowsheets  Navigator Follow Up Date: 06/16/2022  Navigator Follow Up Reason: Follow-up Appointment  Navigator Location [facility]  Navigator Encounter Type Telephone  Telephone Incoming Call  Patient Visit Type MedOnc  Treatment Phase Active Tx  Barriers/Navigation Needs Coordination of Care;Education  Interventions Coordination of Care  Acuity Level 2-Minimal Needs (1-2 Barriers Identified)  Coordination of Care Appts  Education Method Verbal  Support Groups/Services Friends and Family  Time Spent with Patient 15

## 2022-06-13 ENCOUNTER — Ambulatory Visit: Payer: Medicare Other | Admitting: Hematology & Oncology

## 2022-06-15 ENCOUNTER — Ambulatory Visit (HOSPITAL_COMMUNITY): Payer: Medicare Other | Attending: Cardiology

## 2022-06-15 DIAGNOSIS — I10 Essential (primary) hypertension: Secondary | ICD-10-CM

## 2022-06-15 DIAGNOSIS — E119 Type 2 diabetes mellitus without complications: Secondary | ICD-10-CM | POA: Insufficient documentation

## 2022-06-15 DIAGNOSIS — C50911 Malignant neoplasm of unspecified site of right female breast: Secondary | ICD-10-CM | POA: Diagnosis not present

## 2022-06-15 DIAGNOSIS — E785 Hyperlipidemia, unspecified: Secondary | ICD-10-CM | POA: Diagnosis not present

## 2022-06-15 DIAGNOSIS — Z79899 Other long term (current) drug therapy: Secondary | ICD-10-CM | POA: Diagnosis not present

## 2022-06-15 DIAGNOSIS — C50919 Malignant neoplasm of unspecified site of unspecified female breast: Secondary | ICD-10-CM | POA: Insufficient documentation

## 2022-06-15 DIAGNOSIS — Z9011 Acquired absence of right breast and nipple: Secondary | ICD-10-CM

## 2022-06-15 DIAGNOSIS — Z01818 Encounter for other preprocedural examination: Secondary | ICD-10-CM | POA: Diagnosis not present

## 2022-06-15 LAB — ECHOCARDIOGRAM COMPLETE
Area-P 1/2: 3.28 cm2
S' Lateral: 3.7 cm

## 2022-06-16 ENCOUNTER — Encounter: Payer: Self-pay | Admitting: Hematology & Oncology

## 2022-06-16 ENCOUNTER — Other Ambulatory Visit: Payer: Self-pay

## 2022-06-16 ENCOUNTER — Inpatient Hospital Stay: Payer: Medicare Other | Attending: Hematology & Oncology | Admitting: Hematology & Oncology

## 2022-06-16 VITALS — BP 135/55 | HR 58 | Temp 98.1°F | Resp 18 | Ht 66.0 in | Wt 205.4 lb

## 2022-06-16 DIAGNOSIS — Z9011 Acquired absence of right breast and nipple: Secondary | ICD-10-CM | POA: Diagnosis not present

## 2022-06-16 DIAGNOSIS — Z7901 Long term (current) use of anticoagulants: Secondary | ICD-10-CM | POA: Diagnosis not present

## 2022-06-16 DIAGNOSIS — C50911 Malignant neoplasm of unspecified site of right female breast: Secondary | ICD-10-CM | POA: Insufficient documentation

## 2022-06-16 DIAGNOSIS — Z171 Estrogen receptor negative status [ER-]: Secondary | ICD-10-CM | POA: Diagnosis not present

## 2022-06-16 DIAGNOSIS — Z7984 Long term (current) use of oral hypoglycemic drugs: Secondary | ICD-10-CM | POA: Insufficient documentation

## 2022-06-16 DIAGNOSIS — Z79899 Other long term (current) drug therapy: Secondary | ICD-10-CM | POA: Insufficient documentation

## 2022-06-16 NOTE — Progress Notes (Incomplete)
Hematology and Oncology Follow Up Visit  Brandi Roberts [id number] 12-30-1955 66 y.o. 06/16/2022   Principle Diagnosis:  ***  Current Therapy:   ***     Interim History:  Brandi Roberts is ***  Medications:  Current Outpatient Medications:  .  albuterol (VENTOLIN HFA) 108 (90 Base) MCG/ACT inhaler, Inhale 2 puffs into the lungs every 6 (six) hours as needed., Disp: , Rfl:  .  ALPRAZolam (XANAX) 0.5 MG tablet, Take 0.5 mg by mouth 2 (two) times daily as needed., Disp: , Rfl:  .  amLODipine (NORVASC) 10 MG tablet, Take 10 mg by mouth daily., Disp: , Rfl:  .  atorvastatin (LIPITOR) 10 MG tablet, Take 5 mg by mouth at bedtime., Disp: , Rfl:  .  benazepril (LOTENSIN) 40 MG tablet, Take 40 mg by mouth daily., Disp: , Rfl:  .  doxazosin (CARDURA) 2 MG tablet, Take 2 mg by mouth daily as needed., Disp: , Rfl:  .  ELIQUIS 5 MG TABS tablet, Take 5 mg by mouth 2 (two) times daily., Disp: , Rfl:  .  gabapentin (NEURONTIN) 600 MG tablet, Take 600 mg by mouth 2 (two) times daily., Disp: , Rfl:  .  hydrALAZINE (APRESOLINE) 50 MG tablet, Take 50 mg by mouth 2 (two) times daily., Disp: , Rfl:  .  HYDROcodone-acetaminophen (NORCO/VICODIN) 5-325 MG tablet, Take 2 tablets by mouth 2 (two) times daily., Disp: , Rfl:  .  metFORMIN (GLUCOPHAGE) 1000 MG tablet, Take 1,000 mg by mouth 2 (two) times daily., Disp: , Rfl:  .  metoprolol succinate (TOPROL-XL) 100 MG 24 hr tablet, Take 100 mg by mouth daily., Disp: , Rfl:  .  ondansetron (ZOFRAN-ODT) 4 MG disintegrating tablet, Take 1 tablet (4 mg total) by mouth every 8 (eight) hours as needed for nausea or vomiting., Disp: 20 tablet, Rfl: 0 .  oxyCODONE (OXY IR/ROXICODONE) 5 MG immediate release tablet, Take 1 tablet (5 mg total) by mouth every 6 (six) hours as needed for moderate pain, severe pain or breakthrough pain., Disp: 25 tablet, Rfl: 0 .  sulfamethoxazole-trimethoprim (BACTRIM DS) 800-160 MG tablet, Take 1 tablet by mouth 2 (two) times daily., Disp: ,  Rfl:  .  triamterene-hydrochlorothiazide (MAXZIDE) 75-50 MG per tablet, Take 1 tablet by mouth daily., Disp: , Rfl:  .  Vitamin D, Ergocalciferol, (DRISDOL) 1.25 MG (50000 UNIT) CAPS capsule, Take 50,000 Units by mouth once a week., Disp: , Rfl:   Allergies:  Allergies  Allergen Reactions  . Levaquin [Levofloxacin] Nausea And Vomiting and Other (See Comments)    dizziness    Past Medical History, Surgical history, Social history, and Family History were reviewed and updated.  Review of Systems: Review of Systems - Oncology  Physical Exam:  height is '5\' 6"'$  (1.676 m) and weight is 205 lb 6.4 oz (93.2 kg). Her oral temperature is 98.1 F (36.7 C). Her blood pressure is 135/55 (abnormal) and her pulse is 58 (abnormal). Her respiration is 18 and oxygen saturation is 100%.   Wt Readings from Last 3 Encounters:  06/16/22 205 lb 6.4 oz (93.2 kg)  04/18/22 209 lb 1.3 oz (94.8 kg)  04/14/22 205 lb 0.4 oz (93 kg)    Physical Exam   Lab Results  Component Value Date   WBC 10.5 04/18/2022   HGB 10.3 (L) 04/18/2022   HCT 31.4 (L) 04/18/2022   MCV 93.7 04/18/2022   PLT 277 04/18/2022     Chemistry      Component Value Date/Time  NA 138 04/18/2022 1050   K 3.7 04/18/2022 1050   CL 100 04/18/2022 1050   CO2 31 04/18/2022 1050   BUN 19 04/18/2022 1050   CREATININE 1.10 (H) 04/18/2022 1050      Component Value Date/Time   CALCIUM 9.8 04/18/2022 1050   ALKPHOS 56 04/18/2022 1050   AST 12 (L) 04/18/2022 1050   ALT 8 04/18/2022 1050   BILITOT 0.4 04/18/2022 1050         Impression and Plan: Brandi Roberts is ***   Volanda Napoleon, MD 6/22/20235:25 PM

## 2022-06-17 ENCOUNTER — Encounter: Payer: Self-pay | Admitting: *Deleted

## 2022-06-17 ENCOUNTER — Encounter: Payer: Self-pay | Admitting: Hematology & Oncology

## 2022-06-21 ENCOUNTER — Encounter: Payer: Self-pay | Admitting: *Deleted

## 2022-06-22 NOTE — Progress Notes (Signed)
Pharmacist Chemotherapy Monitoring - Initial Assessment    Anticipated start date: 07/01/22   The following has been reviewed per standard work regarding the patient's treatment regimen: The patient's diagnosis, treatment plan and drug doses, and organ/hematologic function Lab orders and baseline tests specific to treatment regimen  The treatment plan start date, drug sequencing, and pre-medications Prior authorization status  Patient's documented medication list, including drug-drug interaction screen and prescriptions for anti-emetics and supportive care specific to the treatment regimen The drug concentrations, fluid compatibility, administration routes, and timing of the medications to be used The patient's access for treatment and lifetime cumulative dose history, if applicable  The patient's medication allergies and previous infusion related reactions, if applicable   Changes made to treatment plan:  N/A  Follow up needed:  N/A   Brandi Roberts, [place], 06/22/2022  9:02 AM

## 2022-06-27 ENCOUNTER — Other Ambulatory Visit: Payer: Self-pay | Admitting: Radiology

## 2022-06-27 ENCOUNTER — Other Ambulatory Visit: Payer: Self-pay | Admitting: Student

## 2022-06-27 DIAGNOSIS — C50911 Malignant neoplasm of unspecified site of right female breast: Secondary | ICD-10-CM

## 2022-06-29 ENCOUNTER — Ambulatory Visit (HOSPITAL_COMMUNITY)
Admission: RE | Admit: 2022-06-29 | Discharge: 2022-06-29 | Disposition: A | Discharge status: Home / Self Care | Payer: Medicare Other | Source: Ambulatory Visit | Attending: Hematology & Oncology | Admitting: Hematology & Oncology

## 2022-06-29 ENCOUNTER — Other Ambulatory Visit: Payer: Self-pay | Admitting: Hematology & Oncology

## 2022-06-29 ENCOUNTER — Other Ambulatory Visit: Payer: Self-pay | Admitting: *Deleted

## 2022-06-29 DIAGNOSIS — C50911 Malignant neoplasm of unspecified site of right female breast: Secondary | ICD-10-CM | POA: Diagnosis present

## 2022-06-29 DIAGNOSIS — I10 Essential (primary) hypertension: Secondary | ICD-10-CM | POA: Insufficient documentation

## 2022-06-29 DIAGNOSIS — Z171 Estrogen receptor negative status [ER-]: Secondary | ICD-10-CM | POA: Insufficient documentation

## 2022-06-29 DIAGNOSIS — Z87891 Personal history of nicotine dependence: Secondary | ICD-10-CM | POA: Diagnosis not present

## 2022-06-29 DIAGNOSIS — E119 Type 2 diabetes mellitus without complications: Secondary | ICD-10-CM | POA: Insufficient documentation

## 2022-06-29 DIAGNOSIS — K219 Gastro-esophageal reflux disease without esophagitis: Secondary | ICD-10-CM | POA: Diagnosis not present

## 2022-06-29 HISTORY — PX: IR IMAGING GUIDED PORT INSERTION: IMG5740

## 2022-06-29 LAB — GLUCOSE, CAPILLARY: Glucose-Capillary: 94 mg/dL (ref 70–99)

## 2022-06-29 MED ORDER — LIDOCAINE-EPINEPHRINE 1 %-1:100000 IJ SOLN
INTRAMUSCULAR | Status: AC
  Filled 2022-06-29: qty 1

## 2022-06-29 MED ORDER — SODIUM CHLORIDE 0.9 % IV SOLN: INTRAVENOUS | Status: DC

## 2022-06-29 MED ORDER — MIDAZOLAM HCL 2 MG/2ML IJ SOLN
INTRAMUSCULAR | Status: AC
  Filled 2022-06-29: qty 2

## 2022-06-29 MED ORDER — MIDAZOLAM HCL 2 MG/2ML IJ SOLN
INTRAMUSCULAR | Status: AC | PRN
  Administered 2022-06-29 (×2): 1 mg via INTRAVENOUS

## 2022-06-29 MED ORDER — HEPARIN SOD (PORK) LOCK FLUSH 100 UNIT/ML IV SOLN
INTRAVENOUS | Status: AC | PRN
  Administered 2022-06-29: 500 [IU] via INTRAVENOUS

## 2022-06-29 MED ORDER — HEPARIN SOD (PORK) LOCK FLUSH 100 UNIT/ML IV SOLN
INTRAVENOUS | Status: AC
  Filled 2022-06-29: qty 5

## 2022-06-29 MED ORDER — FENTANYL CITRATE (PF) 100 MCG/2ML IJ SOLN
INTRAMUSCULAR | Status: AC | PRN
  Administered 2022-06-29 (×2): 50 ug via INTRAVENOUS

## 2022-06-29 MED ORDER — FENTANYL CITRATE (PF) 100 MCG/2ML IJ SOLN
INTRAMUSCULAR | Status: AC
  Filled 2022-06-29: qty 2

## 2022-06-29 MED ORDER — LIDOCAINE-PRILOCAINE 2.5-2.5 % EX CREA: TOPICAL_CREAM | CUTANEOUS | 3 refills | Status: DC

## 2022-06-29 NOTE — Discharge Instructions (Addendum)
If you have any questions or concerns call Interventional Radiology clinic at [phone]  Moderate Conscious Sedation, Adult, Care After This sheet gives you information about how to care for yourself after your procedure. Your health care provider may also give you more specific instructions. If you have problems or questions, contact your health care provider. What can I expect after the procedure? After the procedure, it is common to have: Sleepiness for several hours. Impaired judgment for several hours. Difficulty with balance. Vomiting if you eat too soon. Follow these instructions at home: For the time period you were told by your health care provider:     Rest. Do not participate in activities where you could fall or become injured. Do not drive or use machinery. Do not drink alcohol. Do not take sleeping pills or medicines that cause drowsiness. Do not make important decisions or sign legal documents. Do not take care of children on your own. Eating and drinking  Follow the diet recommended by your health care provider. Drink enough fluid to keep your urine pale yellow. If you vomit: Drink water, juice, or soup when you can drink without vomiting. Make sure you have little or no nausea before eating solid foods. General instructions Take over-the-counter and prescription medicines only as told by your health care provider. Have a responsible adult stay with you for the time you are told. It is important to have someone help care for you until you are awake and alert. Do not smoke. Keep all follow-up visits as told by your health care provider. This is important. Contact a health care provider if: You are still sleepy or having trouble with balance after 24 hours. You feel light-headed. You keep feeling nauseous or you keep vomiting. You develop a rash. You have a fever. You have redness or swelling around the IV site. Get help right away if: You have trouble  breathing. You have new-onset confusion at home. Summary After the procedure, it is common to feel sleepy, have impaired judgment, or feel nauseous if you eat too soon. Rest after you get home. Know the things you should not do after the procedure. Follow the diet recommended by your health care provider and drink enough fluid to keep your urine pale yellow. Get help right away if you have trouble breathing or new-onset confusion at home. This information is not intended to replace advice given to you by your health care provider. Make sure you discuss any questions you have with your health care provider. Document Revised: 04/10/2020 Document Reviewed: 11/07/2019 Elsevier Patient Education  Bear Dance Insertion, Care After The following information offers guidance on how to care for yourself after your procedure. Your health care provider may also give you more specific instructions. If you have problems or questions, contact your health care provider.  Urgent needs- Interventional Radiology clinic- [phone] Wound- May remove dressing in 24-48 hours and shower. Otherwise keep site clean and dry. May replace dressing with clean bandaids as needed.  Your Provider should set up monthly appointments for Port flush.  What can I expect after the procedure? After the procedure, it is common to have: Discomfort at the port insertion site. Bruising on the skin over the port. This should improve over 3-4 days. Follow these instructions at home: [facility] care After your port is placed, you will get a manufacturer's information card. The card has information about your port. Keep this card with you at all times.  Take care of the port as told by your health care provider. Ask your health care provider if you or a family member can get training for taking care of the port at home. A home health care nurse will be be available to help care for the port. Make sure to  remember what type of port you have. Incision care     Follow instructions from your health care provider about how to take care of your port insertion site. Make sure you: Wash your hands with soap and water for at least 20 seconds before and after you change your bandage (dressing). If soap and water are not available, use hand sanitizer. Change your dressing as told by your health care provider. Leave stitches (sutures), skin glue, or adhesive strips in place. These skin closures may need to stay in place for 2 weeks or longer. If adhesive strip edges start to loosen and curl up, you may trim the loose edges. Do not remove adhesive strips completely unless your health care provider tells you to do that. Check your port insertion site every day for signs of infection. Check for: Redness, swelling, or pain. Fluid or blood. Warmth. Pus or a bad smell. Activity Return to your normal activities as told by your health care provider. Ask your health care provider what activities are safe for you. You may have to avoid lifting. Ask your health care provider how much you can safely lift. General instructions Take over-the-counter and prescription medicines only as told by your health care provider. Do not take baths, swim, or use a hot tub until site healed. Ask your health care provider if you may take showers. You may only be allowed to take sponge baths. If you were given a sedative during the procedure, it can affect you for several hours. Do not drive or operate machinery until your health care provider says that it is safe. Wear a medical alert bracelet in case of an emergency. This will tell any health care providers that you have a port. Keep all follow-up visits. This is important. Contact a health care provider if: You cannot flush your port with saline as directed, or you cannot draw blood from the port. You have a fever or chills. You have redness, swelling, or pain around your port  insertion site. You have fluid or blood coming from your port insertion site. Your port insertion site feels warm to the touch. You have pus or a bad smell coming from the port insertion site. Get help right away if: You have chest pain or shortness of breath. You have bleeding from your port that you cannot control. These symptoms may be an emergency. Get help right away. Call 911. Do not wait to see if the symptoms will go away. Do not drive yourself to the hospital. Summary Take care of the port as told by your health care provider. Keep the manufacturer's information card with you at all times. Change your dressing as told by your health care provider. Contact a health care provider if you have a fever or chills or if you have redness, swelling, or pain around your port insertion site. Keep all follow-up visits. This information is not intended to replace advice given to you by your health care provider. Make sure you discuss any questions you have with your health care provider. Document Revised: 06/15/2021 Document Reviewed: 06/15/2021 Elsevier Patient Education  [place].

## 2022-06-29 NOTE — Consult Note (Signed)
Chief Complaint: Patient was seen in consultation today for port a cath placement  Referring Physician(s): Ennever,Peter R  Supervising Physician: Sandi Mariscal  Patient Status: [facility] - Out-pt  History of Present Illness: Brandi Roberts is a 66 y.o. female with PMH sig for GERD, asthma, DM, DVT, HTN, and right breast cancer with prior mastectomy. She presents today for port a cath placement to assist with treatment.  Past Medical History:  Diagnosis Date   Acid reflux    Asthma    Breast cancer ([place])    Diabetes mellitus without complication ([place])    DVT (deep venous thrombosis) ([place])    Goals of care, counseling/discussion 04/19/2022   Hypertension    Neuropathy    Shingles    Stage II carcinoma of breast, ER-, right ([place]) 04/19/2022    Past Surgical History:  Procedure Laterality Date   AXILLARY SENTINEL NODE BIOPSY Right 04/14/2022   Procedure: RIGHT AXILLARY SENTINEL LYMPH NODE BIOPSY;  Surgeon: [name], MD;  Location: [place];  Service: General;  Laterality: Right;   BREAST SURGERY     CHOLECYSTECTOMY     TOTAL MASTECTOMY Right 03/22/2022   Procedure: RIGHT TOTAL MASTECTOMY;  Surgeon: [name], MD;  Location: [place];  Service: General;  Laterality: Right;    Allergies: Levaquin [levofloxacin]  Medications: Prior to Admission medications   Medication Sig Start Date End Date Taking? Authorizing Provider  albuterol (VENTOLIN HFA) 108 (90 Base) MCG/ACT inhaler Inhale 2 puffs into the lungs every 6 (six) hours as needed. 05/21/21   [provider]  ALPRAZolam Duanne Moron) 0.5 MG tablet Take 0.5 mg by mouth 2 (two) times daily as needed. 09/13/21   [provider]  amLODipine (NORVASC) 10 MG tablet Take 10 mg by mouth daily. 07/12/21   [provider]  atorvastatin (LIPITOR) 10 MG tablet Take 5 mg by mouth at bedtime. 05/10/21   [provider]  benazepril (LOTENSIN) 40 MG tablet  Take 40 mg by mouth daily. 09/07/21   [provider]  doxazosin (CARDURA) 2 MG tablet Take 2 mg by mouth daily as needed. 09/07/21   [provider]  ELIQUIS 5 MG TABS tablet Take 5 mg by mouth 2 (two) times daily. 09/04/21   [provider]  gabapentin (NEURONTIN) 600 MG tablet Take 600 mg by mouth 2 (two) times daily. 08/06/21   [provider]  hydrALAZINE (APRESOLINE) 50 MG tablet Take 50 mg by mouth 2 (two) times daily. 09/15/21   [provider]  HYDROcodone-acetaminophen (NORCO/VICODIN) 5-325 MG tablet Take 2 tablets by mouth 2 (two) times daily. 03/25/22   [provider]  lidocaine-prilocaine (EMLA) cream Apply to affected area once 06/29/22   Volanda Napoleon, MD  metFORMIN (GLUCOPHAGE) 1000 MG tablet Take 1,000 mg by mouth 2 (two) times daily. 08/13/21   [provider]  metoprolol succinate (TOPROL-XL) 100 MG 24 hr tablet Take 100 mg by mouth daily. 09/07/21   [provider]  ondansetron (ZOFRAN-ODT) 4 MG disintegrating tablet Take 1 tablet (4 mg total) by mouth every 8 (eight) hours as needed for nausea or vomiting. 03/09/22   Corena Herter, PA-C  oxyCODONE (OXY IR/ROXICODONE) 5 MG immediate release tablet Take 1 tablet (5 mg total) by mouth every 6 (six) hours as needed for moderate pain, severe pain or breakthrough pain. 04/14/22   [name], MD  sulfamethoxazole-trimethoprim (BACTRIM DS) 800-160 MG tablet Take 1 tablet by mouth 2 (two) times daily.  06/14/22   [provider]  triamterene-hydrochlorothiazide (MAXZIDE) 75-50 MG per tablet Take 1 tablet by mouth daily. 01/16/14   [provider]  Vitamin D, Ergocalciferol, (DRISDOL) 1.25 MG (50000 UNIT) CAPS capsule Take 50,000 Units by mouth once a week. 09/13/21   [provider]     Family History  Problem Relation Age of Onset   Breast cancer Neg Hx     Social History   Socioeconomic History   Marital status: Married    Spouse  name: Not on file   Number of children: Not on file   Years of education: Not on file   Highest education level: Not on file  Occupational History   Not on file  Tobacco Use   Smoking status: Former    Packs/day: 0.50    Years: 15.00    Total pack years: 7.50    Types: Cigarettes    Quit date: 2018    Years since quitting: 5.5   Smokeless tobacco: Never  Vaping Use   Vaping Use: Never used  Substance and Sexual Activity   Alcohol use: No   Drug use: No   Sexual activity: Not on file  Other Topics Concern   Not on file  Social History Narrative   Not on file   Social Determinants of Health   Financial Resource Strain: Not on file  Food Insecurity: Not on file  Transportation Needs: Not on file  Physical Activity: Not on file  Stress: Not on file  Social Connections: Not on file      Review of Systems :denies fever,HA,CP, dyspnea, cough, abd/back pain,N/V or bleeding  Vital Signs: BP (!) 142/62   Pulse 60   Temp 98.5 F (36.9 C) (Oral)   Resp 18   SpO2 97%      Physical Exam awake/alert; chest- CTA bilat; heart- RRR; abd- soft,+BS,NT; bilat pretibial edema noted  Imaging: ECHOCARDIOGRAM COMPLETE  Result Date: 06/15/2022    ECHOCARDIOGRAM REPORT   Patient Name:   Brandi Roberts Date of Exam: 06/15/2022 Medical Rec #:  [id number]               Height:       66.0 in Accession #:    [id number]              Weight:       209.1 lb Date of Birth:  Dec 07, 1956                BSA:          2.038 m Patient Age:    57 years                BP:           128/78 mmHg Patient Gender: F                       HR:           60 bpm. Exam Location:  [place] Procedure: 2D Echo, Cardiac Doppler and Color Doppler Indications:    Z09 Chemo  History:        Patient has no prior history of Echocardiogram examinations.                 Breast cancer, Signs/Symptoms:Chemo; Risk Factors:Hypertension,                 Dyslipidemia and Diabetes.  Sonographer:    Coralyn Helling RDCS  Referring Phys:  [place] Comments: Suboptimal parasternal window and Technically difficult study due to poor echo windows. IMPRESSIONS  1. Left ventricular ejection fraction, by estimation, is 55 to 60%. The left ventricle has normal function. The left ventricle has no regional wall motion abnormalities. Left ventricular diastolic parameters were normal.  2. Right ventricular systolic function is normal. The right ventricular size is not well visualized.  3. Left atrial size was mildly dilated.  4. The mitral valve is normal in structure. Trivial mitral valve regurgitation. No evidence of mitral stenosis.  5. The aortic valve was not well visualized. Aortic valve regurgitation is not visualized. No aortic stenosis is present.  6. The inferior vena cava is normal in size with greater than 50% respiratory variability, suggesting right atrial pressure of 3 mmHg. Comparison(s): No prior Echocardiogram. Conclusion(s)/Recommendation(s): Normal biventricular function without evidence of hemodynamically significant valvular heart disease. Difficult echo windows, unable to measure GLS. FINDINGS  Left Ventricle: Left ventricular ejection fraction, by estimation, is 55 to 60%. The left ventricle has normal function. The left ventricle has no regional wall motion abnormalities. The left ventricular internal cavity size was normal in size. There is  no left ventricular hypertrophy. Left ventricular diastolic parameters were normal. Right Ventricle: The right ventricular size is not well visualized. Right vetricular wall thickness was not well visualized. Right ventricular systolic function is normal. Left Atrium: Left atrial size was mildly dilated. Right Atrium: Right atrial size was normal in size. Pericardium: There is no evidence of pericardial effusion. Mitral Valve: The mitral valve is normal in structure. Trivial mitral valve regurgitation. No evidence of mitral valve stenosis. Tricuspid Valve:  The tricuspid valve is normal in structure. Tricuspid valve regurgitation is trivial. No evidence of tricuspid stenosis. Aortic Valve: The aortic valve was not well visualized. Aortic valve regurgitation is not visualized. No aortic stenosis is present. Pulmonic Valve: The pulmonic valve was not well visualized. Pulmonic valve regurgitation is not visualized. No evidence of pulmonic stenosis. Aorta: The aortic root, ascending aorta, aortic arch and descending aorta are all structurally normal, with no evidence of dilitation or obstruction. Venous: The inferior vena cava is normal in size with greater than 50% respiratory variability, suggesting right atrial pressure of 3 mmHg. IAS/Shunts: The atrial septum is grossly normal.  LEFT VENTRICLE PLAX 2D LVIDd:         5.30 cm   Diastology LVIDs:         3.70 cm   LV e' medial:    7.65 cm/s LV PW:         0.90 cm   LV E/e' medial:  14.5 LV IVS:        0.90 cm   LV e' lateral:   11.80 cm/s LVOT diam:     2.00 cm   LV E/e' lateral: 9.4 LV SV:         70 LV SV Index:   34 LVOT Area:     3.14 cm  IVC IVC diam: 1.80 cm LEFT ATRIUM             Index        RIGHT ATRIUM LA diam:        3.60 cm 1.77 cm/m   RA Pressure: 3.00 mmHg LA Vol (A2C):   65.6 ml 32.19 ml/m LA Vol (A4C):   53.7 ml 26.35 ml/m LA Biplane Vol: 59.9 ml 29.39 ml/m  AORTIC VALVE LVOT Vmax:   81.40 cm/s LVOT Vmean:  58.233 cm/s LVOT VTI:  0.221 m  AORTA Ao Root diam: 3.10 cm Ao Asc diam:  3.30 cm MITRAL VALVE                TRICUSPID VALVE MV Area (PHT): 3.28 cm     Estimated RAP:  3.00 mmHg MV Decel Time: 232 msec MV E velocity: 110.60 cm/s  SHUNTS MV A velocity: 106.52 cm/s  Systemic VTI:  0.22 m MV E/A ratio:  1.04         Systemic Diam: 2.00 cm Buford Dresser MD Electronically signed by Buford Dresser MD Signature Date/Time: 06/15/2022/8:54:13 PM    Final     Labs:  CBC: Recent Labs    10/03/21 1556 04/18/22 1050  WBC 11.7* 10.5  HGB 11.9* 10.3*  HCT 35.7* 31.4*  PLT 244 277     COAGS: Recent Labs    10/03/21 1556  INR 1.0    BMP: Recent Labs    10/03/21 1556 03/15/22 1318 04/08/22 1100 04/18/22 1050  NA 135 139 133* 138  K 4.0 4.4 4.4 3.7  CL 99 105 101 100  CO2 '27 25 25 31  '$ GLUCOSE 99 108* 100* 117*  BUN 19 23 25* 19  CALCIUM 9.2 9.4 9.0 9.8  CREATININE 1.25* 1.44* 1.71* 1.10*  GFRNONAA 47* 40* 32* 55*    LIVER FUNCTION TESTS: Recent Labs    04/18/22 1050  BILITOT 0.4  AST 12*  ALT 8  ALKPHOS 56  PROT 7.4  ALBUMIN 4.0    TUMOR MARKERS: No results for input(s): "AFPTM", "CEA", "CA199", "CHROMGRNA" in the last 8760 hours.  Assessment and Plan: 66 y.o. female with PMH sig for GERD, asthma, DM, DVT, HTN, and right breast cancer with prior mastectomy. She presents today for port a cath placement to assist with treatment.Risks and benefits of image guided port-a-catheter placement was discussed with the patient including, but not limited to bleeding, infection, pneumothorax, or fibrin sheath development and need for additional procedures.  All of the patient's questions were answered, patient is agreeable to proceed. Consent signed and in chart.    Thank you for this interesting consult.  I greatly enjoyed meeting Alesia Oshields and look forward to participating in their care.  A copy of this report was sent to the requesting provider on this date.  Electronically Signed: D. Rowe Robert, PA-C 06/29/2022, 1:04 PM   I spent a total of   20 minutes  in face to face in clinical consultation, greater than 50% of which was counseling/coordinating care for port a cath placement

## 2022-06-29 NOTE — Procedures (Signed)
Pre Procedure Dx: Poor venous access Post Procedural Dx: Same  Successful placement of left IJ approach port-a-cath with tip at the superior caval atrial junction. The catheter is ready for immediate use.  Estimated Blood Loss: Trace  Complications: None immediate.  Jay Decarlo Rivet, MD Pager #: [phone]   

## 2022-06-30 ENCOUNTER — Encounter: Payer: Self-pay | Admitting: *Deleted

## 2022-06-30 NOTE — Progress Notes (Signed)
Patient calls with some questions. He thought her appointment will only be 90 minutes. She also is confused at how we can access her port, which was placed yesterday, when she is told she can't use her emla cream.   Explained to patient that 68 is her treatment infusion time. Reviewed all the requirements before infusion can start and why her appointment will be longer than 90 minutes. Also reviewed that the infusion nurse will use ice to numb her site before accessing it the first time.   Patient was appreciative of the clarification but expressed frustration that she has been told different. She has no further needs at this time.   Oncology Nurse Navigator Documentation     06/30/2022   11:30 AM  Oncology Nurse Navigator Flowsheets  Navigator Follow Up Date: 07/01/2022  Navigator Follow Up Reason: Chemotherapy;Chemo Industrial/product designer Encounter Type Telephone  Telephone Appt Confirmation/Clarification  Patient Visit Type MedOnc  Treatment Phase Active Tx  Barriers/Navigation Needs Coordination of Care;Education  Education Other  Interventions Education;Psycho-Social Support  Acuity Level 2-Minimal Needs (1-2 Barriers Identified)  Support Groups/Services Friends and Family  Time Spent with Patient 15

## 2022-07-01 ENCOUNTER — Inpatient Hospital Stay: Payer: Medicare Other

## 2022-07-01 ENCOUNTER — Encounter: Payer: Self-pay | Admitting: *Deleted

## 2022-07-01 ENCOUNTER — Inpatient Hospital Stay: Payer: Medicare Other | Attending: Hematology & Oncology

## 2022-07-01 VITALS — BP 151/61 | HR 58 | Temp 98.0°F | Resp 17

## 2022-07-01 VITALS — BP 118/59 | HR 53

## 2022-07-01 DIAGNOSIS — Z7901 Long term (current) use of anticoagulants: Secondary | ICD-10-CM | POA: Insufficient documentation

## 2022-07-01 DIAGNOSIS — Z5112 Encounter for antineoplastic immunotherapy: Secondary | ICD-10-CM | POA: Insufficient documentation

## 2022-07-01 DIAGNOSIS — C50911 Malignant neoplasm of unspecified site of right female breast: Secondary | ICD-10-CM

## 2022-07-01 DIAGNOSIS — Z7984 Long term (current) use of oral hypoglycemic drugs: Secondary | ICD-10-CM | POA: Insufficient documentation

## 2022-07-01 DIAGNOSIS — Z79899 Other long term (current) drug therapy: Secondary | ICD-10-CM | POA: Diagnosis not present

## 2022-07-01 LAB — CBC WITH DIFFERENTIAL (CANCER CENTER ONLY)
Abs Immature Granulocytes: 0.02 10*3/uL (ref 0.00–0.07)
Basophils Absolute: 0 10*3/uL (ref 0.0–0.1)
Basophils Relative: 1 %
Eosinophils Absolute: 0.1 10*3/uL (ref 0.0–0.5)
Eosinophils Relative: 1 %
HCT: 31.5 % — ABNORMAL LOW (ref 36.0–46.0)
Hemoglobin: 10.6 g/dL — ABNORMAL LOW (ref 12.0–15.0)
Immature Granulocytes: 0 %
Lymphocytes Relative: 21 %
Lymphs Abs: 1.8 10*3/uL (ref 0.7–4.0)
MCH: 30 pg (ref 26.0–34.0)
MCHC: 33.7 g/dL (ref 30.0–36.0)
MCV: 89.2 fL (ref 80.0–100.0)
Monocytes Absolute: 0.7 10*3/uL (ref 0.1–1.0)
Monocytes Relative: 8 %
Neutro Abs: 6.1 10*3/uL (ref 1.7–7.7)
Neutrophils Relative %: 69 %
Platelet Count: 232 10*3/uL (ref 150–400)
RBC: 3.53 MIL/uL — ABNORMAL LOW (ref 3.87–5.11)
RDW: 12.7 % (ref 11.5–15.5)
WBC Count: 8.7 10*3/uL (ref 4.0–10.5)
nRBC: 0 % (ref 0.0–0.2)

## 2022-07-01 LAB — CMP (CANCER CENTER ONLY)
ALT: 12 U/L (ref 0–44)
AST: 17 U/L (ref 15–41)
Albumin: 4.3 g/dL (ref 3.5–5.0)
Alkaline Phosphatase: 51 U/L (ref 38–126)
Anion gap: 7 (ref 5–15)
BUN: 27 mg/dL — ABNORMAL HIGH (ref 8–23)
CO2: 26 mmol/L (ref 22–32)
Calcium: 9.7 mg/dL (ref 8.9–10.3)
Chloride: 100 mmol/L (ref 98–111)
Creatinine: 1.23 mg/dL — ABNORMAL HIGH (ref 0.44–1.00)
GFR, Estimated: 48 mL/min — ABNORMAL LOW (ref >60)
Glucose, Bld: 122 mg/dL — ABNORMAL HIGH (ref 70–99)
Potassium: 3.9 mmol/L (ref 3.5–5.1)
Sodium: 133 mmol/L — ABNORMAL LOW (ref 135–145)
Total Bilirubin: 0.3 mg/dL (ref 0.3–1.2)
Total Protein: 7.2 g/dL (ref 6.5–8.1)

## 2022-07-01 MED ORDER — ACETAMINOPHEN 325 MG PO TABS
650.0000 mg | ORAL_TABLET | Freq: Once | ORAL | Status: AC
  Administered 2022-07-01: 650 mg via ORAL
  Filled 2022-07-01: qty 2

## 2022-07-01 MED ORDER — SODIUM CHLORIDE 0.9% FLUSH
10.0000 mL | Freq: Once | INTRAVENOUS | Status: AC
  Administered 2022-07-01: 10 mL via INTRAVENOUS

## 2022-07-01 MED ORDER — SODIUM CHLORIDE 0.9% FLUSH
10.0000 mL | INTRAVENOUS | Status: DC | PRN
  Administered 2022-07-01: 10 mL

## 2022-07-01 MED ORDER — SODIUM CHLORIDE 0.9 % IV SOLN: Freq: Once | INTRAVENOUS | Status: AC

## 2022-07-01 MED ORDER — HEPARIN SOD (PORK) LOCK FLUSH 100 UNIT/ML IV SOLN
500.0000 [IU] | Freq: Once | INTRAVENOUS | Status: AC | PRN
  Administered 2022-07-01: 500 [IU]

## 2022-07-01 MED ORDER — DIPHENHYDRAMINE HCL 25 MG PO CAPS
50.0000 mg | ORAL_CAPSULE | Freq: Once | ORAL | Status: AC
  Administered 2022-07-01: 50 mg via ORAL
  Filled 2022-07-01: qty 2

## 2022-07-01 MED ORDER — SODIUM CHLORIDE 0.9 % IV SOLN
3.4000 mg/kg | Freq: Once | INTRAVENOUS | Status: AC
  Administered 2022-07-01: 320 mg via INTRAVENOUS
  Filled 2022-07-01: qty 16

## 2022-07-01 NOTE — Progress Notes (Signed)
Patient here to begin her Kadcyla. She is not overly eager to start treatment but understands the recommendation and why it's necessary. She reports her incision is healing well. She will receive chemo education in the treatment room today prior to chemo.   Encouraged her to ask any necessary questions and to call the office with any problems or questions.   Oncology Nurse Navigator Documentation     07/01/2022   11:00 AM  Oncology Nurse Navigator Flowsheets  Phase of Treatment Chemo  Chemotherapy Actual Start Date: 07/01/2022  Chemotherapy Expected End Date: 02/17/2023  Navigator Follow Up Date: 07/22/2022  Navigator Follow Up Reason: Follow-up Appointment;Chemotherapy  Navigator Restaurant manager, fast food Encounter Type Treatment  Patient Visit Type MedOnc  Treatment Phase Active Tx  Barriers/Navigation Needs Coordination of Care;Education  Education Other  Interventions Education;Psycho-Social Support  Acuity Level 2-Minimal Needs (1-2 Barriers Identified)  Support Groups/Services Friends and Family  Time Spent with Patient 15

## 2022-07-01 NOTE — Progress Notes (Signed)
Patient in chemotherapy education class with self  Discussed side effects of Kadcyla  which include but are not limited to myelosuppression, decreased appetite, fatigue, fever, allergic or infusional reaction, mucositis, cardiac toxicity, cough, SOB, altered taste, nausea and vomiting, diarrhea, constipation, elevated LFTs myalgia and arthralgias, hair loss or thinning, rash, skin dryness, nail changes, peripheral neuropathy, discolored urine, delayed wound healing, mental changes (Chemo brain), increased risk of infections, weight loss.  Reviewed infusion room and office policy and procedure and phone numbers 24 hours x 7 days a week.  Reviewed ambulatory pump specifics and how to manage safe handling at home.  Reviewed when to call the office with any concerns or problems.  Scientist, clinical (histocompatibility and immunogenetics) given.  Discussed portacath insertion and EMLA cream administration.  Antiemetic protocol and chemotherapy schedule reviewed. Patient verbalized understanding of chemotherapy indications and possible side effects.  Teachback done

## 2022-07-01 NOTE — Patient Instructions (Signed)
Ado-Trastuzumab Emtansine for injection What is this medication? ADO-TRASTUZUMAB EMTANSINE (ADD oh traz TOO zuh mab em TAN zine) is a monoclonal antibody combined with chemotherapy. It is used to treat breast cancer. This medicine may be used for other purposes; ask your health care provider or pharmacist if you have questions. COMMON BRAND NAME(S): Kadcyla What should I tell my care team before I take this medication? They need to know if you have any of these conditions: heart disease heart failure infection (especially a virus infection such as chickenpox, cold sores, or herpes) liver disease lung or breathing disease, like asthma tingling of the fingers or toes, or other nerve disorder an unusual or allergic reaction to ado-trastuzumab emtansine, other medications, foods, dyes, or preservatives pregnant or trying to get pregnant breast-feeding How should I use this medication? This medicine is for infusion into a vein. It is given by a health care professional in a hospital or clinic setting. Talk to your pediatrician regarding the use of this medicine in children. Special care may be needed. Overdosage: If you think you have taken too much of this medicine contact a poison control center or emergency room at once. NOTE: This medicine is only for you. Do not share this medicine with others. What if I miss a dose? It is important not to miss your dose. Call your doctor or health care professional if you are unable to keep an appointment. What may interact with this medication? This medicine may also interact with the following medications: atazanavir boceprevir clarithromycin delavirdine indinavir dalfopristin; quinupristin isoniazid, INH itraconazole ketoconazole nefazodone nelfinavir ritonavir telaprevir telithromycin tipranavir voriconazole This list may not describe all possible interactions. Give your health care provider a list of all the medicines, herbs,  non-prescription drugs, or dietary supplements you use. Also tell them if you smoke, drink alcohol, or use illegal drugs. Some items may interact with your medicine. What should I watch for while using this medication? Visit your doctor for checks on your progress. This drug may make you feel generally unwell. This is not uncommon, as chemotherapy can affect healthy cells as well as cancer cells. Report any side effects. Continue your course of treatment even though you feel ill unless your doctor tells you to stop. You may need blood work done while you are taking this medicine. Call your doctor or health care professional for advice if you get a fever, chills or sore throat, or other symptoms of a cold or flu. Do not treat yourself. This drug decreases your body's ability to fight infections. Try to avoid being around people who are sick. Be careful brushing and flossing your teeth or using a toothpick because you may get an infection or bleed more easily. If you have any dental work done, tell your dentist you are receiving this medicine. Avoid taking products that contain aspirin, acetaminophen, ibuprofen, naproxen, or ketoprofen unless instructed by your doctor. These medicines may hide a fever. Do not become pregnant while taking this medicine or for 7 months after stopping it, men with female partners should use contraception during treatment and for 4 months after the last dose. Women should inform their doctor if they wish to become pregnant or think they might be pregnant. There is a potential for serious side effects to an unborn child. Do not breast-feed an infant while taking this medicine or for 7 months after the last dose. Men who have a partner who is pregnant or who is capable of becoming pregnant should use a condom  during sexual activity while taking this medicine and for 4 months after stopping it. Men should inform their doctors if they wish to father a child. This medicine may lower  sperm counts. Talk to your health care professional or pharmacist for more information. What side effects may I notice from receiving this medication? Side effects that you should report to your doctor or health care professional as soon as possible: allergic reactions like skin rash, itching or hives, swelling of the face, lips, or tongue breathing problems chest pain or palpitations fever or chills, sore throat general ill feeling or flu-like symptoms light-colored stools nausea, vomiting pain, tingling, numbness in the hands or feet signs and symptoms of bleeding such as bloody or black, tarry stools; red or dark-brown urine; spitting up blood or brown material that looks like coffee grounds; red spots on the skin; unusual bruising or bleeding from the eye, gums, or nose swelling of the legs or ankles yellowing of the eyes or skin Side effects that usually do not require medical attention (report to your doctor or health care professional if they continue or are bothersome): changes in taste constipation dizziness headache joint pain muscle pain trouble sleeping unusually weak or tired This list may not describe all possible side effects. Call your doctor for medical advice about side effects. You may report side effects to FDA at 1-800-FDA-1088. Where should I keep my medication? This drug is given in a hospital or clinic and will not be stored at home. NOTE: This sheet is a summary. It may not cover all possible information. If you have questions about this medicine, talk to your doctor, pharmacist, or health care provider.  2023 Elsevier/Gold Standard (2019-01-01 00:00:00)

## 2022-07-18 ENCOUNTER — Other Ambulatory Visit: Payer: Self-pay

## 2022-07-22 ENCOUNTER — Other Ambulatory Visit: Payer: Self-pay

## 2022-07-22 ENCOUNTER — Inpatient Hospital Stay: Payer: Medicare Other

## 2022-07-22 ENCOUNTER — Encounter: Payer: Self-pay | Admitting: *Deleted

## 2022-07-22 ENCOUNTER — Encounter: Payer: Self-pay | Admitting: Hematology & Oncology

## 2022-07-22 ENCOUNTER — Inpatient Hospital Stay: Payer: Medicare Other | Admitting: Hematology & Oncology

## 2022-07-22 VITALS — BP 148/53 | HR 61 | Resp 17

## 2022-07-22 VITALS — BP 176/55 | HR 69 | Temp 97.8°F | Wt 201.0 lb

## 2022-07-22 DIAGNOSIS — Z171 Estrogen receptor negative status [ER-]: Secondary | ICD-10-CM | POA: Diagnosis not present

## 2022-07-22 DIAGNOSIS — C50911 Malignant neoplasm of unspecified site of right female breast: Secondary | ICD-10-CM

## 2022-07-22 LAB — CMP (CANCER CENTER ONLY)
ALT: 19 U/L (ref 0–44)
AST: 18 U/L (ref 15–41)
Albumin: 4.1 g/dL (ref 3.5–5.0)
Alkaline Phosphatase: 56 U/L (ref 38–126)
Anion gap: 7 (ref 5–15)
BUN: 23 mg/dL (ref 8–23)
CO2: 27 mmol/L (ref 22–32)
Calcium: 9.8 mg/dL (ref 8.9–10.3)
Chloride: 98 mmol/L (ref 98–111)
Creatinine: 1.21 mg/dL — ABNORMAL HIGH (ref 0.44–1.00)
GFR, Estimated: 49 mL/min — ABNORMAL LOW (ref >60)
Glucose, Bld: 126 mg/dL — ABNORMAL HIGH (ref 70–99)
Potassium: 4 mmol/L (ref 3.5–5.1)
Sodium: 132 mmol/L — ABNORMAL LOW (ref 135–145)
Total Bilirubin: 0.3 mg/dL (ref 0.3–1.2)
Total Protein: 7.4 g/dL (ref 6.5–8.1)

## 2022-07-22 LAB — CBC WITH DIFFERENTIAL (CANCER CENTER ONLY)
Abs Immature Granulocytes: 0.04 10*3/uL (ref 0.00–0.07)
Basophils Absolute: 0.1 10*3/uL (ref 0.0–0.1)
Basophils Relative: 1 %
Eosinophils Absolute: 0.1 10*3/uL (ref 0.0–0.5)
Eosinophils Relative: 1 %
HCT: 31.2 % — ABNORMAL LOW (ref 36.0–46.0)
Hemoglobin: 10.3 g/dL — ABNORMAL LOW (ref 12.0–15.0)
Immature Granulocytes: 0 %
Lymphocytes Relative: 20 %
Lymphs Abs: 2.1 10*3/uL (ref 0.7–4.0)
MCH: 29.3 pg (ref 26.0–34.0)
MCHC: 33 g/dL (ref 30.0–36.0)
MCV: 88.9 fL (ref 80.0–100.0)
Monocytes Absolute: 0.6 10*3/uL (ref 0.1–1.0)
Monocytes Relative: 6 %
Neutro Abs: 7.6 10*3/uL (ref 1.7–7.7)
Neutrophils Relative %: 72 %
Platelet Count: 364 10*3/uL (ref 150–400)
RBC: 3.51 MIL/uL — ABNORMAL LOW (ref 3.87–5.11)
RDW: 12.9 % (ref 11.5–15.5)
WBC Count: 10.5 10*3/uL (ref 4.0–10.5)
nRBC: 0 % (ref 0.0–0.2)

## 2022-07-22 LAB — LACTATE DEHYDROGENASE: LDH: 226 U/L — ABNORMAL HIGH (ref 98–192)

## 2022-07-22 MED ORDER — SODIUM CHLORIDE 0.9 % IV SOLN: Freq: Once | INTRAVENOUS | Status: AC

## 2022-07-22 MED ORDER — SODIUM CHLORIDE 0.9 % IV SOLN
3.4000 mg/kg | Freq: Once | INTRAVENOUS | Status: AC
  Administered 2022-07-22: 320 mg via INTRAVENOUS
  Filled 2022-07-22: qty 16

## 2022-07-22 MED ORDER — SODIUM CHLORIDE 0.9% FLUSH
10.0000 mL | INTRAVENOUS | Status: DC | PRN
  Administered 2022-07-22: 10 mL

## 2022-07-22 MED ORDER — ACETAMINOPHEN 325 MG PO TABS
650.0000 mg | ORAL_TABLET | Freq: Once | ORAL | Status: AC
  Administered 2022-07-22: 650 mg via ORAL
  Filled 2022-07-22: qty 2

## 2022-07-22 MED ORDER — HEPARIN SOD (PORK) LOCK FLUSH 100 UNIT/ML IV SOLN
500.0000 [IU] | Freq: Once | INTRAVENOUS | Status: AC | PRN
  Administered 2022-07-22: 500 [IU]

## 2022-07-22 MED ORDER — DIPHENHYDRAMINE HCL 25 MG PO CAPS
50.0000 mg | ORAL_CAPSULE | Freq: Once | ORAL | Status: AC
  Administered 2022-07-22: 50 mg via ORAL
  Filled 2022-07-22: qty 2

## 2022-07-22 NOTE — Patient Instructions (Signed)
Ado-Trastuzumab Emtansine for injection What is this medication? ADO-TRASTUZUMAB EMTANSINE (ADD oh traz TOO zuh mab em TAN zine) is a monoclonal antibody combined with chemotherapy. It is used to treat breast cancer. This medicine may be used for other purposes; ask your health care provider or pharmacist if you have questions. COMMON BRAND NAME(S): Kadcyla What should I tell my care team before I take this medication? They need to know if you have any of these conditions: heart disease heart failure infection (especially a virus infection such as chickenpox, cold sores, or herpes) liver disease lung or breathing disease, like asthma tingling of the fingers or toes, or other nerve disorder an unusual or allergic reaction to ado-trastuzumab emtansine, other medications, foods, dyes, or preservatives pregnant or trying to get pregnant breast-feeding How should I use this medication? This medicine is for infusion into a vein. It is given by a health care professional in a hospital or clinic setting. Talk to your pediatrician regarding the use of this medicine in children. Special care may be needed. Overdosage: If you think you have taken too much of this medicine contact a poison control center or emergency room at once. NOTE: This medicine is only for you. Do not share this medicine with others. What if I miss a dose? It is important not to miss your dose. Call your doctor or health care professional if you are unable to keep an appointment. What may interact with this medication? This medicine may also interact with the following medications: atazanavir boceprevir clarithromycin delavirdine indinavir dalfopristin; quinupristin isoniazid, INH itraconazole ketoconazole nefazodone nelfinavir ritonavir telaprevir telithromycin tipranavir voriconazole This list may not describe all possible interactions. Give your health care provider a list of all the medicines, herbs,  non-prescription drugs, or dietary supplements you use. Also tell them if you smoke, drink alcohol, or use illegal drugs. Some items may interact with your medicine. What should I watch for while using this medication? Visit your doctor for checks on your progress. This drug may make you feel generally unwell. This is not uncommon, as chemotherapy can affect healthy cells as well as cancer cells. Report any side effects. Continue your course of treatment even though you feel ill unless your doctor tells you to stop. You may need blood work done while you are taking this medicine. Call your doctor or health care professional for advice if you get a fever, chills or sore throat, or other symptoms of a cold or flu. Do not treat yourself. This drug decreases your body's ability to fight infections. Try to avoid being around people who are sick. Be careful brushing and flossing your teeth or using a toothpick because you may get an infection or bleed more easily. If you have any dental work done, tell your dentist you are receiving this medicine. Avoid taking products that contain aspirin, acetaminophen, ibuprofen, naproxen, or ketoprofen unless instructed by your doctor. These medicines may hide a fever. Do not become pregnant while taking this medicine or for 7 months after stopping it, men with female partners should use contraception during treatment and for 4 months after the last dose. Women should inform their doctor if they wish to become pregnant or think they might be pregnant. There is a potential for serious side effects to an unborn child. Do not breast-feed an infant while taking this medicine or for 7 months after the last dose. Men who have a partner who is pregnant or who is capable of becoming pregnant should use a condom  during sexual activity while taking this medicine and for 4 months after stopping it. Men should inform their doctors if they wish to father a child. This medicine may lower  sperm counts. Talk to your health care professional or pharmacist for more information. What side effects may I notice from receiving this medication? Side effects that you should report to your doctor or health care professional as soon as possible: allergic reactions like skin rash, itching or hives, swelling of the face, lips, or tongue breathing problems chest pain or palpitations fever or chills, sore throat general ill feeling or flu-like symptoms light-colored stools nausea, vomiting pain, tingling, numbness in the hands or feet signs and symptoms of bleeding such as bloody or black, tarry stools; red or dark-brown urine; spitting up blood or brown material that looks like coffee grounds; red spots on the skin; unusual bruising or bleeding from the eye, gums, or nose swelling of the legs or ankles yellowing of the eyes or skin Side effects that usually do not require medical attention (report to your doctor or health care professional if they continue or are bothersome): changes in taste constipation dizziness headache joint pain muscle pain trouble sleeping unusually weak or tired This list may not describe all possible side effects. Call your doctor for medical advice about side effects. You may report side effects to FDA at 1-800-FDA-1088. Where should I keep my medication? This drug is given in a hospital or clinic and will not be stored at home. NOTE: This sheet is a summary. It may not cover all possible information. If you have questions about this medicine, talk to your doctor, pharmacist, or health care provider.  2023 Elsevier/Gold Standard (2019-01-01 00:00:00)

## 2022-07-22 NOTE — Patient Instructions (Signed)

## 2022-07-22 NOTE — Progress Notes (Signed)
Hematology and Oncology Follow Up Visit  Brandi Roberts [id number] 1956/06/10 66 y.o. 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  She had a first cycle of Kadcyla.  She is doing okay.  She tolerated this fairly well.  She is not have any problems with fever.  There is no cough or shortness of breath.  She has had no diarrhea.  She has had no nausea or vomiting.  There is been no problems with leg swelling.  She has had no lymphedema of the right arm.  Overall, I would say performance status is probably ECOG 1.    Medications:  Current Outpatient Medications:    albuterol (VENTOLIN HFA) 108 (90 Base) MCG/ACT inhaler, Inhale 2 puffs into the lungs every 6 (six) hours as needed., Disp: , Rfl:    ALPRAZolam (XANAX) 0.5 MG tablet, Take 0.5 mg by mouth 2 (two) times daily as needed., Disp: , Rfl:    amLODipine (NORVASC) 10 MG tablet, Take 10 mg by mouth daily., Disp: , Rfl:    atorvastatin (LIPITOR) 10 MG tablet, Take 5 mg by mouth at bedtime., Disp: , Rfl:    benazepril (LOTENSIN) 40 MG tablet, Take 40 mg by mouth daily., Disp: , Rfl:    doxazosin (CARDURA) 2 MG tablet, Take 2 mg by mouth daily as needed., Disp: , Rfl:    ELIQUIS 5 MG TABS tablet, Take 5 mg by mouth 2 (two) times daily., Disp: , Rfl:    gabapentin (NEURONTIN) 600 MG tablet, Take 600 mg by mouth at bedtime., Disp: , Rfl:    hydrALAZINE (APRESOLINE) 50 MG tablet, Take 50 mg by mouth 2 (two) times daily., Disp: , Rfl:    HYDROcodone-acetaminophen (NORCO/VICODIN) 5-325 MG tablet, Take 2 tablets by mouth 2 (two) times daily., Disp: , Rfl:    lidocaine-prilocaine (EMLA) cream, Apply to affected area once, Disp: 30 g, Rfl: 3   metFORMIN (GLUCOPHAGE) 1000 MG tablet,  Take 1,000 mg by mouth 2 (two) times daily., Disp: , Rfl:    metoprolol succinate (TOPROL-XL) 100 MG 24 hr tablet, Take 100 mg by mouth daily., Disp: , Rfl:    ondansetron (ZOFRAN-ODT) 4 MG disintegrating tablet, Take 1 tablet (4 mg total) by mouth every 8 (eight) hours as needed for nausea or vomiting., Disp: 20 tablet, Rfl: 0   oxyCODONE (OXY IR/ROXICODONE) 5 MG immediate release tablet, Take 1 tablet (5 mg total) by mouth every 6 (six) hours as needed for moderate pain, severe pain or breakthrough pain., Disp: 25 tablet, Rfl: 0   triamterene-hydrochlorothiazide (MAXZIDE) 75-50 MG per tablet, Take 1 tablet by mouth daily., Disp: , Rfl:    Vitamin D, Ergocalciferol, (DRISDOL) 1.25 MG (50000 UNIT) CAPS capsule, Take 50,000 Units by mouth once a week., Disp: , Rfl:   Allergies:  Allergies  Allergen Reactions   Levaquin [Levofloxacin] Nausea And Vomiting and Other (See Comments)    dizziness    Past Medical History, Surgical history, Social history, and Family History were reviewed and updated.  Review of Systems: Review of Systems  Constitutional: Negative.   HENT:  Negative.    Eyes: Negative.   Respiratory: Negative.    Cardiovascular: Negative.   Gastrointestinal: Negative.   Endocrine: Negative.   Genitourinary: Negative.  Musculoskeletal: Negative.   Skin: Negative.   Neurological: Negative.   Hematological: Negative.   Psychiatric/Behavioral: Negative.      Physical Exam:  weight is 201 lb (91.2 kg). Her oral temperature is 97.8 F (36.6 C). Her blood pressure is 176/55 (abnormal) and her pulse is 69. Her oxygen saturation is 99%.   Wt Readings from Last 3 Encounters:  07/22/22 201 lb (91.2 kg)  06/16/22 205 lb 6.4 oz (93.2 kg)  04/18/22 209 lb 1.3 oz (94.8 kg)    Physical Exam Vitals reviewed.  Constitutional:      Comments: Chest wall exam shows right mastectomy.  This is healing very nicely.  She has no erythema or nodularity.  There is no right axillary  adenopathy.  Left breast shows no masses, edema or erythema.  She has had no nodularity or inflammation or swelling.  There is no erythema.  There is no left axillary adenopathy.  HENT:     Head: Normocephalic and atraumatic.  Eyes:     Pupils: Pupils are equal, round, and reactive to light.  Cardiovascular:     Rate and Rhythm: Normal rate and regular rhythm.     Heart sounds: Normal heart sounds.  Pulmonary:     Effort: Pulmonary effort is normal.     Breath sounds: Normal breath sounds.  Abdominal:     General: Bowel sounds are normal.     Palpations: Abdomen is soft.  Musculoskeletal:        General: No tenderness or deformity. Normal range of motion.     Cervical back: Normal range of motion.  Lymphadenopathy:     Cervical: No cervical adenopathy.  Skin:    General: Skin is warm and dry.     Findings: No erythema or rash.  Neurological:     Mental Status: She is alert and oriented to person, place, and time.  Psychiatric:        Behavior: Behavior normal.        Thought Content: Thought content normal.        Judgment: Judgment normal.      Lab Results  Component Value Date   WBC 10.5 07/22/2022   HGB 10.3 (L) 07/22/2022   HCT 31.2 (L) 07/22/2022   MCV 88.9 07/22/2022   PLT 364 07/22/2022     Chemistry      Component Value Date/Time   NA 132 (L) 07/22/2022 0905   K 4.0 07/22/2022 0905   CL 98 07/22/2022 0905   CO2 27 07/22/2022 0905   BUN 23 07/22/2022 0905   CREATININE 1.21 (H) 07/22/2022 0905      Component Value Date/Time   CALCIUM 9.8 07/22/2022 0905   ALKPHOS 56 07/22/2022 0905   AST 18 07/22/2022 0905   ALT 19 07/22/2022 0905   BILITOT 0.3 07/22/2022 0905      Impression and Plan: Brandi Roberts is a very charming 66 year old postmenopausal Afro-American female.  She has a invasive ductal carcinoma of the right breast.  She underwent a mastectomy.  She had 1 lymph node positive.  The tumor is ER -/PR-.  Unfortunately, it is HER2 positive.  We have  her on adjuvant therapy with Kadcyla.  She did not wish to have any chemotherapy.  However, she would take Kadcyla.  We will go ahead with her second cycle of Kadcyla.  She will need a year of Kadcyla.  I will plan to get her back in 3 more weeks for third cycle.  I do not see  that have to do any scans at this time.   Volanda Napoleon, MD 7/28/202310:12 AM

## 2022-07-23 ENCOUNTER — Other Ambulatory Visit: Payer: Self-pay

## 2022-07-26 ENCOUNTER — Other Ambulatory Visit: Payer: Self-pay

## 2022-08-02 ENCOUNTER — Encounter: Payer: Self-pay | Admitting: Hematology & Oncology

## 2022-08-02 NOTE — Progress Notes (Signed)
Oncology Nurse Navigator Documentation     07/22/2022    9:45 AM  Oncology Nurse Navigator Flowsheets  Navigator Follow Up Date: 08/12/2022  Navigator Follow Up Reason: Follow-up Appointment;Chemotherapy  Navigator Location [facility]  Navigator Encounter Type Appt/Treatment Plan Review  Patient Visit Type MedOnc  Treatment Phase Active Tx  Barriers/Navigation Needs Coordination of Care;Education  Interventions None Required  Acuity Level 2-Minimal Needs (1-2 Barriers Identified)  Support Groups/Services Friends and Family  Time Spent with Patient 15

## 2022-08-12 ENCOUNTER — Inpatient Hospital Stay: Payer: Medicare Other

## 2022-08-12 ENCOUNTER — Encounter: Payer: Self-pay | Admitting: *Deleted

## 2022-08-12 ENCOUNTER — Inpatient Hospital Stay: Payer: Medicare Other | Attending: Hematology & Oncology

## 2022-08-12 ENCOUNTER — Inpatient Hospital Stay (HOSPITAL_BASED_OUTPATIENT_CLINIC_OR_DEPARTMENT_OTHER): Payer: Medicare Other | Admitting: Hematology & Oncology

## 2022-08-12 ENCOUNTER — Other Ambulatory Visit: Payer: Self-pay

## 2022-08-12 ENCOUNTER — Encounter: Payer: Self-pay | Admitting: Hematology & Oncology

## 2022-08-12 VITALS — BP 141/55 | HR 58 | Resp 17

## 2022-08-12 VITALS — BP 159/52 | HR 50 | Temp 98.4°F | Resp 18 | Wt 193.0 lb

## 2022-08-12 DIAGNOSIS — C50911 Malignant neoplasm of unspecified site of right female breast: Secondary | ICD-10-CM

## 2022-08-12 DIAGNOSIS — Z7901 Long term (current) use of anticoagulants: Secondary | ICD-10-CM | POA: Insufficient documentation

## 2022-08-12 DIAGNOSIS — Z171 Estrogen receptor negative status [ER-]: Secondary | ICD-10-CM | POA: Insufficient documentation

## 2022-08-12 DIAGNOSIS — Z9011 Acquired absence of right breast and nipple: Secondary | ICD-10-CM | POA: Insufficient documentation

## 2022-08-12 DIAGNOSIS — Z5111 Encounter for antineoplastic chemotherapy: Secondary | ICD-10-CM | POA: Insufficient documentation

## 2022-08-12 LAB — CBC WITH DIFFERENTIAL (CANCER CENTER ONLY)
Abs Immature Granulocytes: 0.02 10*3/uL (ref 0.00–0.07)
Basophils Absolute: 0 10*3/uL (ref 0.0–0.1)
Basophils Relative: 1 %
Eosinophils Absolute: 0.1 10*3/uL (ref 0.0–0.5)
Eosinophils Relative: 1 %
HCT: 31.3 % — ABNORMAL LOW (ref 36.0–46.0)
Hemoglobin: 10.5 g/dL — ABNORMAL LOW (ref 12.0–15.0)
Immature Granulocytes: 0 %
Lymphocytes Relative: 19 %
Lymphs Abs: 1.7 10*3/uL (ref 0.7–4.0)
MCH: 29.3 pg (ref 26.0–34.0)
MCHC: 33.5 g/dL (ref 30.0–36.0)
MCV: 87.4 fL (ref 80.0–100.0)
Monocytes Absolute: 0.7 10*3/uL (ref 0.1–1.0)
Monocytes Relative: 7 %
Neutro Abs: 6.3 10*3/uL (ref 1.7–7.7)
Neutrophils Relative %: 72 %
Platelet Count: 282 10*3/uL (ref 150–400)
RBC: 3.58 MIL/uL — ABNORMAL LOW (ref 3.87–5.11)
RDW: 13.1 % (ref 11.5–15.5)
WBC Count: 8.7 10*3/uL (ref 4.0–10.5)
nRBC: 0 % (ref 0.0–0.2)

## 2022-08-12 LAB — CMP (CANCER CENTER ONLY)
ALT: 18 U/L (ref 0–44)
AST: 20 U/L (ref 15–41)
Albumin: 4.1 g/dL (ref 3.5–5.0)
Alkaline Phosphatase: 52 U/L (ref 38–126)
Anion gap: 8 (ref 5–15)
BUN: 24 mg/dL — ABNORMAL HIGH (ref 8–23)
CO2: 27 mmol/L (ref 22–32)
Calcium: 9.8 mg/dL (ref 8.9–10.3)
Chloride: 98 mmol/L (ref 98–111)
Creatinine: 1.37 mg/dL — ABNORMAL HIGH (ref 0.44–1.00)
GFR, Estimated: 42 mL/min — ABNORMAL LOW (ref >60)
Glucose, Bld: 110 mg/dL — ABNORMAL HIGH (ref 70–99)
Potassium: 3.7 mmol/L (ref 3.5–5.1)
Sodium: 133 mmol/L — ABNORMAL LOW (ref 135–145)
Total Bilirubin: 0.4 mg/dL (ref 0.3–1.2)
Total Protein: 7.8 g/dL (ref 6.5–8.1)

## 2022-08-12 LAB — LACTATE DEHYDROGENASE: LDH: 229 U/L — ABNORMAL HIGH (ref 98–192)

## 2022-08-12 MED ORDER — SODIUM CHLORIDE 0.9 % IV SOLN: Freq: Once | INTRAVENOUS | Status: AC

## 2022-08-12 MED ORDER — DIPHENHYDRAMINE HCL 25 MG PO CAPS
50.0000 mg | ORAL_CAPSULE | Freq: Once | ORAL | Status: AC
  Administered 2022-08-12: 50 mg via ORAL
  Filled 2022-08-12: qty 2

## 2022-08-12 MED ORDER — HEPARIN SOD (PORK) LOCK FLUSH 100 UNIT/ML IV SOLN
500.0000 [IU] | Freq: Once | INTRAVENOUS | Status: AC | PRN
  Administered 2022-08-12: 500 [IU]

## 2022-08-12 MED ORDER — ACETAMINOPHEN 325 MG PO TABS
650.0000 mg | ORAL_TABLET | Freq: Once | ORAL | Status: AC
  Administered 2022-08-12: 650 mg via ORAL
  Filled 2022-08-12: qty 2

## 2022-08-12 MED ORDER — SERTRALINE HCL 25 MG PO TABS: 25.0000 mg | ORAL_TABLET | Freq: Every day | ORAL | 3 refills | Status: DC

## 2022-08-12 MED ORDER — SODIUM CHLORIDE 0.9 % IV SOLN
3.4000 mg/kg | Freq: Once | INTRAVENOUS | Status: AC
  Administered 2022-08-12: 320 mg via INTRAVENOUS
  Filled 2022-08-12: qty 16

## 2022-08-12 MED ORDER — SODIUM CHLORIDE 0.9% FLUSH
10.0000 mL | INTRAVENOUS | Status: DC | PRN
  Administered 2022-08-12: 10 mL

## 2022-08-12 MED ORDER — DIPHENHYDRAMINE HCL 25 MG PO CAPS
ORAL_CAPSULE | ORAL | Status: AC
  Filled 2022-08-12: qty 1

## 2022-08-12 NOTE — Patient Instructions (Signed)
[facility]  Discharge Instructions: Thank you for choosing [facility] to provide your oncology and hematology care.   If you have a lab appointment with the Cancer Center, please go directly to the Cancer Center and check in at the registration area.  Wear comfortable clothing and clothing appropriate for easy access to any Portacath or PICC line.   We strive to give you quality time with your provider. You may need to reschedule your appointment if you arrive late (15 or more minutes).  Arriving late affects you and other patients whose appointments are after yours.  Also, if you miss three or more appointments without notifying the office, you may be dismissed from the clinic at the provider's discretion.      For prescription refill requests, have your pharmacy contact our office and allow 72 hours for refills to be completed.    Today you received the following chemotherapy and/or immunotherapy agents Kadcyla      To help prevent nausea and vomiting after your treatment, we encourage you to take your nausea medication as directed.  BELOW ARE SYMPTOMS THAT SHOULD BE REPORTED IMMEDIATELY: *FEVER GREATER THAN 100.4 F (38 C) OR HIGHER *CHILLS OR SWEATING *NAUSEA AND VOMITING THAT IS NOT CONTROLLED WITH YOUR NAUSEA MEDICATION *UNUSUAL SHORTNESS OF BREATH *UNUSUAL BRUISING OR BLEEDING *URINARY PROBLEMS (pain or burning when urinating, or frequent urination) *BOWEL PROBLEMS (unusual diarrhea, constipation, pain near the anus) TENDERNESS IN MOUTH AND THROAT WITH OR WITHOUT PRESENCE OF ULCERS (sore throat, sores in mouth, or a toothache) UNUSUAL RASH, SWELLING OR PAIN  UNUSUAL VAGINAL DISCHARGE OR ITCHING   Items with * indicate a potential emergency and should be followed up as soon as possible or go to the Emergency Department if any problems should occur.  Please show the CHEMOTHERAPY ALERT CARD or IMMUNOTHERAPY ALERT CARD at check-in to the  Emergency Department and triage nurse. Should you have questions after your visit or need to cancel or reschedule your appointment, please contact [facility]  [phone] and follow the prompts.  Office hours are 8:00 a.m. to 4:30 p.m. Monday - Friday. Please note that voicemails left after 4:00 p.m. may not be returned until the following business day.  We are closed weekends and major holidays. You have access to a nurse at all times for urgent questions. Please call the main number to the clinic [phone] and follow the prompts.  For any non-urgent questions, you may also contact your provider using MyChart. We now offer e-Visits for anyone 18 and older to request care online for non-urgent symptoms. For details visit [email or web address].   Also download the MyChart app! Go to the app store, search "MyChart", open the app, select [facility], and log in with your MyChart username and password.  Masks are optional in the cancer centers. If you would like for your care team to wear a mask while they are taking care of you, please let them know. You may have one support person who is at least 68 years old accompany you for your appointments. 

## 2022-08-12 NOTE — Progress Notes (Unsigned)
Patient is doing well today. She reports excellent tolerance of her treatment with minimal side effects.   Oncology Nurse Navigator Documentation     08/12/2022   12:30 PM  Oncology Nurse Navigator Flowsheets  Navigator Follow Up Date: 09/02/2022  Navigator Follow Up Reason: Follow-up Appointment;Chemotherapy  Navigator Location [facility]  Navigator Encounter Type Treatment;Appt/Treatment Plan Review  Patient Visit Type MedOnc  Treatment Phase Active Tx  Barriers/Navigation Needs No Barriers At This Time  Interventions Psycho-Social Support  Acuity Level 1-No Barriers  Support Groups/Services Friends and Family  Time Spent with Patient 15

## 2022-08-12 NOTE — Progress Notes (Signed)
Hematology and Oncology Follow Up Visit  Brandi Roberts [id number] [birth year] 66 y.o. 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  So far, the Kadcyla is doing quite nicely.  She is having little bit of diarrhea.  Otherwise, she really has had no side effects.  She has had no cough or shortness of breath.  She has had no nausea or vomiting.  She has had no urinary difficulties.  There is no bleeding.  She has had no rashes.  She has had no leg swelling.  There is been no headache.  She is eating well..  She has had no chest wall pain.  There is no tingling in the hands or feet.  Overall, I would say that her performance status is probably ECOG 0.      Medications:  Current Outpatient Medications:    albuterol (VENTOLIN HFA) 108 (90 Base) MCG/ACT inhaler, Inhale 2 puffs into the lungs every 6 (six) hours as needed., Disp: , Rfl:    ALPRAZolam (XANAX) 0.5 MG tablet, Take 0.5 mg by mouth 2 (two) times daily as needed., Disp: , Rfl:    amLODipine (NORVASC) 10 MG tablet, Take 10 mg by mouth daily., Disp: , Rfl:    atorvastatin (LIPITOR) 10 MG tablet, Take 5 mg by mouth at bedtime., Disp: , Rfl:    benazepril (LOTENSIN) 40 MG tablet, Take 40 mg by mouth daily., Disp: , Rfl:    doxazosin (CARDURA) 2 MG tablet, Take 2 mg by mouth daily as needed., Disp: , Rfl:    ELIQUIS 5 MG TABS tablet, Take 5 mg by mouth 2 (two) times daily., Disp: , Rfl:    Ferrous Sulfate (IRON) 325 (65 Fe) MG TABS, Take 1 tablet by mouth daily., Disp: , Rfl:    gabapentin (NEURONTIN) 600 MG tablet, Take 600 mg by mouth at bedtime., Disp: , Rfl:    hydrALAZINE (APRESOLINE) 50 MG tablet, Take 50 mg by mouth 2 (two) times daily., Disp: , Rfl:     HYDROcodone-acetaminophen (NORCO/VICODIN) 5-325 MG tablet, Take 2 tablets by mouth 2 (two) times daily., Disp: , Rfl:    lidocaine-prilocaine (EMLA) cream, Apply to affected area once, Disp: 30 g, Rfl: 3   metFORMIN (GLUCOPHAGE) 1000 MG tablet, Take 1,000 mg by mouth 2 (two) times daily., Disp: , Rfl:    metoprolol succinate (TOPROL-XL) 100 MG 24 hr tablet, Take 100 mg by mouth daily., Disp: , Rfl:    ondansetron (ZOFRAN-ODT) 4 MG disintegrating tablet, Take 1 tablet (4 mg total) by mouth every 8 (eight) hours as needed for nausea or vomiting., Disp: 20 tablet, Rfl: 0   oxyCODONE (OXY IR/ROXICODONE) 5 MG immediate release tablet, Take 1 tablet (5 mg total) by mouth every 6 (six) hours as needed for moderate pain, severe pain or breakthrough pain., Disp: 25 tablet, Rfl: 0   triamterene-hydrochlorothiazide (MAXZIDE) 75-50 MG per tablet, Take 1 tablet by mouth daily., Disp: , Rfl:    Vitamin D, Ergocalciferol, (DRISDOL) 1.25 MG (50000 UNIT) CAPS capsule, Take 50,000 Units by mouth once a week., Disp: , Rfl:   Allergies:  Allergies  Allergen Reactions   Levaquin [Levofloxacin] Nausea And Vomiting and Other (See Comments)    dizziness    Past Medical  History, Surgical history, Social history, and Family History were reviewed and updated.  Review of Systems: Review of Systems  Constitutional: Negative.   HENT:  Negative.    Eyes: Negative.   Respiratory: Negative.    Cardiovascular: Negative.   Gastrointestinal: Negative.   Endocrine: Negative.   Genitourinary: Negative.    Musculoskeletal: Negative.   Skin: Negative.   Neurological: Negative.   Hematological: Negative.   Psychiatric/Behavioral: Negative.      Physical Exam:  weight is 193 lb (87.5 kg). Her oral temperature is 98.4 F (36.9 C). Her blood pressure is 159/52 (abnormal) and her pulse is 50 (abnormal). Her respiration is 18 and oxygen saturation is 100%.   Wt Readings from Last 3 Encounters:  08/12/22 193 lb (87.5 kg)   07/22/22 201 lb (91.2 kg)  06/16/22 205 lb 6.4 oz (93.2 kg)    Physical Exam Vitals reviewed.  Constitutional:      Comments: Chest wall exam shows right mastectomy.  This is healing very nicely.  She has no erythema or nodularity.  There is no right axillary adenopathy.  Left breast shows no masses, edema or erythema.  She has had no nodularity or inflammation or swelling.  There is no erythema.  There is no left axillary adenopathy.  HENT:     Head: Normocephalic and atraumatic.  Eyes:     Pupils: Pupils are equal, round, and reactive to light.  Cardiovascular:     Rate and Rhythm: Normal rate and regular rhythm.     Heart sounds: Normal heart sounds.  Pulmonary:     Effort: Pulmonary effort is normal.     Breath sounds: Normal breath sounds.  Abdominal:     General: Bowel sounds are normal.     Palpations: Abdomen is soft.  Musculoskeletal:        General: No tenderness or deformity. Normal range of motion.     Cervical back: Normal range of motion.  Lymphadenopathy:     Cervical: No cervical adenopathy.  Skin:    General: Skin is warm and dry.     Findings: No erythema or rash.  Neurological:     Mental Status: She is alert and oriented to person, place, and time.  Psychiatric:        Behavior: Behavior normal.        Thought Content: Thought content normal.        Judgment: Judgment normal.      Lab Results  Component Value Date   WBC 8.7 08/12/2022   HGB 10.5 (L) 08/12/2022   HCT 31.3 (L) 08/12/2022   MCV 87.4 08/12/2022   PLT 282 08/12/2022     Chemistry      Component Value Date/Time   NA 133 (L) 08/12/2022 1125   K 3.7 08/12/2022 1125   CL 98 08/12/2022 1125   CO2 27 08/12/2022 1125   BUN 24 (H) 08/12/2022 1125   CREATININE 1.37 (H) 08/12/2022 1125      Component Value Date/Time   CALCIUM 9.8 08/12/2022 1125   ALKPHOS 52 08/12/2022 1125   AST 20 08/12/2022 1125   ALT 18 08/12/2022 1125   BILITOT 0.4 08/12/2022 1125      Impression and  Plan: Brandi Roberts is a very charming 66 year old postmenopausal Afro-American female.  She has a invasive ductal carcinoma of the right breast.  She underwent a mastectomy.  She had 1 lymph node positive.  The tumor is ER -/PR-.  Unfortunately, her breast cancer is HER2 positive.  We have her on adjuvant therapy with Kadcyla.  She did not wish to have any chemotherapy.  However, she would take Kadcyla.  We will go ahead with her 3rd cycle of Kadcyla.  She will need a year of Kadcyla.  I will plan to get her back in 3 more weeks for 4th cycle.  Her initial echocardiogram was done back in June.  As such, I do not think we need another 1 probably until November or December.  Of note, we did talk about some stressors that she has at home right now.  This is causing issues with her.  We will try her on some low-dose Zoloft at 25 mg p.o. daily.   Volanda Napoleon, MD 8/18/202312:24 PM

## 2022-08-15 ENCOUNTER — Inpatient Hospital Stay: Payer: Medicare Other

## 2022-08-15 ENCOUNTER — Encounter: Payer: Self-pay | Admitting: Hematology & Oncology

## 2022-08-15 ENCOUNTER — Inpatient Hospital Stay: Payer: Medicare Other | Admitting: Hematology & Oncology

## 2022-08-15 NOTE — Addendum Note (Signed)
Addended by: Volanda Napoleon on: 08/15/2022 07:19 AM   Modules accepted: Orders

## 2022-08-16 ENCOUNTER — Encounter: Payer: Self-pay | Admitting: Hematology & Oncology

## 2022-08-17 ENCOUNTER — Other Ambulatory Visit: Payer: Self-pay

## 2022-08-26 ENCOUNTER — Other Ambulatory Visit: Payer: Self-pay

## 2022-08-27 ENCOUNTER — Other Ambulatory Visit: Payer: Self-pay

## 2022-08-28 ENCOUNTER — Other Ambulatory Visit: Payer: Self-pay

## 2022-08-29 ENCOUNTER — Other Ambulatory Visit: Payer: Self-pay

## 2022-09-02 ENCOUNTER — Inpatient Hospital Stay: Payer: Medicare Other | Attending: Hematology & Oncology

## 2022-09-02 ENCOUNTER — Ambulatory Visit: Payer: Medicare Other

## 2022-09-02 ENCOUNTER — Inpatient Hospital Stay (HOSPITAL_BASED_OUTPATIENT_CLINIC_OR_DEPARTMENT_OTHER): Payer: Medicare Other | Admitting: Hematology & Oncology

## 2022-09-02 ENCOUNTER — Inpatient Hospital Stay: Payer: Medicare Other

## 2022-09-02 ENCOUNTER — Other Ambulatory Visit: Payer: Medicare Other

## 2022-09-02 ENCOUNTER — Encounter: Payer: Self-pay | Admitting: *Deleted

## 2022-09-02 ENCOUNTER — Encounter: Payer: Self-pay | Admitting: Hematology & Oncology

## 2022-09-02 ENCOUNTER — Other Ambulatory Visit: Payer: Self-pay

## 2022-09-02 ENCOUNTER — Ambulatory Visit: Payer: Medicare Other | Admitting: Family

## 2022-09-02 VITALS — BP 157/47 | HR 59 | Resp 18

## 2022-09-02 DIAGNOSIS — Z171 Estrogen receptor negative status [ER-]: Secondary | ICD-10-CM | POA: Insufficient documentation

## 2022-09-02 DIAGNOSIS — C50911 Malignant neoplasm of unspecified site of right female breast: Secondary | ICD-10-CM

## 2022-09-02 DIAGNOSIS — Z79899 Other long term (current) drug therapy: Secondary | ICD-10-CM | POA: Diagnosis not present

## 2022-09-02 DIAGNOSIS — Z9011 Acquired absence of right breast and nipple: Secondary | ICD-10-CM | POA: Insufficient documentation

## 2022-09-02 DIAGNOSIS — Z7901 Long term (current) use of anticoagulants: Secondary | ICD-10-CM | POA: Insufficient documentation

## 2022-09-02 DIAGNOSIS — Z5112 Encounter for antineoplastic immunotherapy: Secondary | ICD-10-CM | POA: Insufficient documentation

## 2022-09-02 DIAGNOSIS — Z7984 Long term (current) use of oral hypoglycemic drugs: Secondary | ICD-10-CM | POA: Diagnosis not present

## 2022-09-02 LAB — CBC WITH DIFFERENTIAL (CANCER CENTER ONLY)
Abs Immature Granulocytes: 0.01 10*3/uL (ref 0.00–0.07)
Basophils Absolute: 0 10*3/uL (ref 0.0–0.1)
Basophils Relative: 1 %
Eosinophils Absolute: 0.1 10*3/uL (ref 0.0–0.5)
Eosinophils Relative: 1 %
HCT: 29.7 % — ABNORMAL LOW (ref 36.0–46.0)
Hemoglobin: 10.1 g/dL — ABNORMAL LOW (ref 12.0–15.0)
Immature Granulocytes: 0 %
Lymphocytes Relative: 17 %
Lymphs Abs: 1.5 10*3/uL (ref 0.7–4.0)
MCH: 29.4 pg (ref 26.0–34.0)
MCHC: 34 g/dL (ref 30.0–36.0)
MCV: 86.3 fL (ref 80.0–100.0)
Monocytes Absolute: 0.6 10*3/uL (ref 0.1–1.0)
Monocytes Relative: 7 %
Neutro Abs: 6.4 10*3/uL (ref 1.7–7.7)
Neutrophils Relative %: 74 %
Platelet Count: 281 10*3/uL (ref 150–400)
RBC: 3.44 MIL/uL — ABNORMAL LOW (ref 3.87–5.11)
RDW: 13.1 % (ref 11.5–15.5)
WBC Count: 8.5 10*3/uL (ref 4.0–10.5)
nRBC: 0 % (ref 0.0–0.2)

## 2022-09-02 LAB — CMP (CANCER CENTER ONLY)
ALT: 26 U/L (ref 0–44)
AST: 25 U/L (ref 15–41)
Albumin: 4 g/dL (ref 3.5–5.0)
Alkaline Phosphatase: 66 U/L (ref 38–126)
Anion gap: 8 (ref 5–15)
BUN: 30 mg/dL — ABNORMAL HIGH (ref 8–23)
CO2: 27 mmol/L (ref 22–32)
Calcium: 9.5 mg/dL (ref 8.9–10.3)
Chloride: 95 mmol/L — ABNORMAL LOW (ref 98–111)
Creatinine: 1.3 mg/dL — ABNORMAL HIGH (ref 0.44–1.00)
GFR, Estimated: 45 mL/min — ABNORMAL LOW (ref >60)
Glucose, Bld: 117 mg/dL — ABNORMAL HIGH (ref 70–99)
Potassium: 3.9 mmol/L (ref 3.5–5.1)
Sodium: 130 mmol/L — ABNORMAL LOW (ref 135–145)
Total Bilirubin: 0.3 mg/dL (ref 0.3–1.2)
Total Protein: 7.4 g/dL (ref 6.5–8.1)

## 2022-09-02 LAB — LACTATE DEHYDROGENASE: LDH: 252 U/L — ABNORMAL HIGH (ref 98–192)

## 2022-09-02 MED ORDER — SODIUM CHLORIDE 0.9% FLUSH
10.0000 mL | Freq: Once | INTRAVENOUS | Status: AC
  Administered 2022-09-02: 10 mL via INTRAVENOUS

## 2022-09-02 MED ORDER — PROCHLORPERAZINE MALEATE 10 MG PO TABS
10.0000 mg | ORAL_TABLET | Freq: Once | ORAL | Status: AC
  Administered 2022-09-02: 10 mg via ORAL

## 2022-09-02 MED ORDER — SODIUM CHLORIDE 0.9% FLUSH
10.0000 mL | INTRAVENOUS | Status: DC | PRN
  Administered 2022-09-02: 10 mL

## 2022-09-02 MED ORDER — SODIUM CHLORIDE 0.9 % IV SOLN
3.6000 mg/kg | Freq: Once | INTRAVENOUS | Status: AC
  Administered 2022-09-02: 320 mg via INTRAVENOUS
  Filled 2022-09-02: qty 16

## 2022-09-02 MED ORDER — SODIUM CHLORIDE 0.9 % IV SOLN: Freq: Once | INTRAVENOUS | Status: AC

## 2022-09-02 MED ORDER — ACETAMINOPHEN 325 MG PO TABS
650.0000 mg | ORAL_TABLET | Freq: Once | ORAL | Status: AC
  Administered 2022-09-02: 650 mg via ORAL
  Filled 2022-09-02: qty 2

## 2022-09-02 MED ORDER — DIPHENHYDRAMINE HCL 25 MG PO CAPS
50.0000 mg | ORAL_CAPSULE | Freq: Once | ORAL | Status: AC
  Administered 2022-09-02: 50 mg via ORAL
  Filled 2022-09-02: qty 2

## 2022-09-02 MED ORDER — HEPARIN SOD (PORK) LOCK FLUSH 100 UNIT/ML IV SOLN
500.0000 [IU] | Freq: Once | INTRAVENOUS | Status: AC | PRN
  Administered 2022-09-02: 500 [IU]

## 2022-09-02 MED ORDER — PROCHLORPERAZINE EDISYLATE 10 MG/2ML IJ SOLN: 10.0000 mg | Freq: Once | INTRAMUSCULAR | Status: DC

## 2022-09-02 MED ORDER — HEPARIN SOD (PORK) LOCK FLUSH 100 UNIT/ML IV SOLN: 500.0000 [IU] | Freq: Once | INTRAVENOUS | Status: DC

## 2022-09-02 NOTE — Patient Instructions (Signed)
[facility]  Discharge Instructions: Thank you for choosing [facility] to provide your oncology and hematology care.   If you have a lab appointment with the Cancer Center, please go directly to the Cancer Center and check in at the registration area.  Wear comfortable clothing and clothing appropriate for easy access to any Portacath or PICC line.   We strive to give you quality time with your provider. You may need to reschedule your appointment if you arrive late (15 or more minutes).  Arriving late affects you and other patients whose appointments are after yours.  Also, if you miss three or more appointments without notifying the office, you may be dismissed from the clinic at the provider's discretion.      For prescription refill requests, have your pharmacy contact our office and allow 72 hours for refills to be completed.    Today you received the following chemotherapy and/or immunotherapy agents Kadcyla      To help prevent nausea and vomiting after your treatment, we encourage you to take your nausea medication as directed.  BELOW ARE SYMPTOMS THAT SHOULD BE REPORTED IMMEDIATELY: *FEVER GREATER THAN 100.4 F (38 C) OR HIGHER *CHILLS OR SWEATING *NAUSEA AND VOMITING THAT IS NOT CONTROLLED WITH YOUR NAUSEA MEDICATION *UNUSUAL SHORTNESS OF BREATH *UNUSUAL BRUISING OR BLEEDING *URINARY PROBLEMS (pain or burning when urinating, or frequent urination) *BOWEL PROBLEMS (unusual diarrhea, constipation, pain near the anus) TENDERNESS IN MOUTH AND THROAT WITH OR WITHOUT PRESENCE OF ULCERS (sore throat, sores in mouth, or a toothache) UNUSUAL RASH, SWELLING OR PAIN  UNUSUAL VAGINAL DISCHARGE OR ITCHING   Items with * indicate a potential emergency and should be followed up as soon as possible or go to the Emergency Department if any problems should occur.  Please show the CHEMOTHERAPY ALERT CARD or IMMUNOTHERAPY ALERT CARD at check-in to the  Emergency Department and triage nurse. Should you have questions after your visit or need to cancel or reschedule your appointment, please contact [facility]  [phone] and follow the prompts.  Office hours are 8:00 a.m. to 4:30 p.m. Monday - Friday. Please note that voicemails left after 4:00 p.m. may not be returned until the following business day.  We are closed weekends and major holidays. You have access to a nurse at all times for urgent questions. Please call the main number to the clinic [phone] and follow the prompts.  For any non-urgent questions, you may also contact your provider using MyChart. We now offer e-Visits for anyone 18 and older to request care online for non-urgent symptoms. For details visit [email or web address].   Also download the MyChart app! Go to the app store, search "MyChart", open the app, select , and log in with your MyChart username and password.  Masks are optional in the cancer centers. If you would like for your care team to wear a mask while they are taking care of you, please let them know. You may have one support person who is at least 68 years old accompany you for your appointments. 

## 2022-09-02 NOTE — Progress Notes (Signed)
Hematology and Oncology Follow Up Visit  Brandi Roberts [id number] 1956/04/10 66 y.o. 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 is is back for her follow-up.  So far, the Kadcyla is doing quite nicely.  She is having little bit of diarrhea.  Otherwise, she really has had no side effects.  She has had no cough or shortness of breath.  She has had no nausea or vomiting.  She has had no urinary difficulties.  There is no bleeding.  She has had no rashes.  She has had no leg swelling.  There is been no headache.  She is eating well..  She has had no chest wall pain.  There is no tingling in the hands or feet.  Overall, I would say that her performance status is probably ECOG 0.      Medications:  Current Outpatient Medications:    albuterol (VENTOLIN HFA) 108 (90 Base) MCG/ACT inhaler, Inhale 2 puffs into the lungs every 6 (six) hours as needed., Disp: , Rfl:    ALPRAZolam (XANAX) 0.5 MG tablet, Take 0.5 mg by mouth 2 (two) times daily as needed., Disp: , Rfl:    amLODipine (NORVASC) 10 MG tablet, Take 10 mg by mouth daily., Disp: , Rfl:    atorvastatin (LIPITOR) 10 MG tablet, Take 5 mg by mouth at bedtime., Disp: , Rfl:    benazepril (LOTENSIN) 40 MG tablet, Take 40 mg by mouth daily., Disp: , Rfl:    doxazosin (CARDURA) 2 MG tablet, Take 2 mg by mouth daily as needed., Disp: , Rfl:    ELIQUIS 5 MG TABS tablet, Take 5 mg by mouth 2 (two) times daily., Disp: , Rfl:    Ferrous Sulfate (IRON) 325 (65 Fe) MG TABS, Take 1 tablet by mouth daily., Disp: , Rfl:    gabapentin (NEURONTIN) 600 MG tablet, Take 600 mg by mouth at bedtime., Disp: , Rfl:    hydrALAZINE (APRESOLINE) 50 MG tablet, Take 50 mg by mouth 2 (two) times daily., Disp: , Rfl:     HYDROcodone-acetaminophen (NORCO/VICODIN) 5-325 MG tablet, Take 2 tablets by mouth 2 (two) times daily., Disp: , Rfl:    metFORMIN (GLUCOPHAGE) 1000 MG tablet, Take 1,000 mg by mouth 2 (two) times daily., Disp: , Rfl:    metoprolol succinate (TOPROL-XL) 100 MG 24 hr tablet, Take 100 mg by mouth daily., Disp: , Rfl:    ondansetron (ZOFRAN-ODT) 4 MG disintegrating tablet, Take 1 tablet (4 mg total) by mouth every 8 (eight) hours as needed for nausea or vomiting., Disp: 20 tablet, Rfl: 0   oxyCODONE (OXY IR/ROXICODONE) 5 MG immediate release tablet, Take 1 tablet (5 mg total) by mouth every 6 (six) hours as needed for moderate pain, severe pain or breakthrough pain., Disp: 25 tablet, Rfl: 0   sertraline (ZOLOFT) 25 MG tablet, Take 1 tablet (25 mg total) by mouth daily., Disp: 30 tablet, Rfl: 3   triamterene-hydrochlorothiazide (MAXZIDE) 75-50 MG per tablet, Take 1 tablet by mouth daily., Disp: , Rfl:    Vitamin D, Ergocalciferol, (DRISDOL) 1.25 MG (50000 UNIT) CAPS capsule, Take 50,000 Units by mouth once a week., Disp: , Rfl:  No current facility-administered medications for this visit.  Facility-Administered Medications Ordered in Other Visits:    diphenhydrAMINE (BENADRYL) 25 mg  capsule, , , ,   Allergies:  Allergies  Allergen Reactions   Levaquin [Levofloxacin] Nausea And Vomiting and Other (See Comments)    dizziness    Past Medical History, Surgical history, Social history, and Family History were reviewed and updated.  Review of Systems: Review of Systems  Constitutional: Negative.   HENT:  Negative.    Eyes: Negative.   Respiratory: Negative.    Cardiovascular: Negative.   Gastrointestinal: Negative.   Endocrine: Negative.   Genitourinary: Negative.    Musculoskeletal: Negative.   Skin: Negative.   Neurological: Negative.   Hematological: Negative.   Psychiatric/Behavioral: Negative.      Physical Exam:  height is $RemoveB'5\' 6"'[id number]$  (1.676 m) and weight is 194 lb 6.4 oz (88.2 kg). Her  oral temperature is 97.9 F (36.6 C). Her blood pressure is 153/52 (abnormal) and her pulse is 50 (abnormal). Her respiration is 18 and oxygen saturation is 100%.   Wt Readings from Last 3 Encounters:  09/02/22 194 lb 6.4 oz (88.2 kg)  08/12/22 193 lb (87.5 kg)  07/22/22 201 lb (91.2 kg)    Physical Exam Vitals reviewed.  Constitutional:      Comments: Chest wall exam shows right mastectomy.  This is healing very nicely.  She has no erythema or nodularity.  There is no right axillary adenopathy.  Left breast shows no masses, edema or erythema.  She has had no nodularity or inflammation or swelling.  There is no erythema.  There is no left axillary adenopathy.  HENT:     Head: Normocephalic and atraumatic.  Eyes:     Pupils: Pupils are equal, round, and reactive to light.  Cardiovascular:     Rate and Rhythm: Normal rate and regular rhythm.     Heart sounds: Normal heart sounds.  Pulmonary:     Effort: Pulmonary effort is normal.     Breath sounds: Normal breath sounds.  Abdominal:     General: Bowel sounds are normal.     Palpations: Abdomen is soft.  Musculoskeletal:        General: No tenderness or deformity. Normal range of motion.     Cervical back: Normal range of motion.  Lymphadenopathy:     Cervical: No cervical adenopathy.  Skin:    General: Skin is warm and dry.     Findings: No erythema or rash.  Neurological:     Mental Status: She is alert and oriented to person, place, and time.  Psychiatric:        Behavior: Behavior normal.        Thought Content: Thought content normal.        Judgment: Judgment normal.      Lab Results  Component Value Date   WBC 8.5 09/02/2022   HGB 10.1 (L) 09/02/2022   HCT 29.7 (L) 09/02/2022   MCV 86.3 09/02/2022   PLT 281 09/02/2022     Chemistry      Component Value Date/Time   NA 130 (L) 09/02/2022 0903   K 3.9 09/02/2022 0903   CL 95 (L) 09/02/2022 0903   CO2 27 09/02/2022 0903   BUN 30 (H) 09/02/2022 0903    CREATININE 1.30 (H) 09/02/2022 0903      Component Value Date/Time   CALCIUM 9.5 09/02/2022 0903   ALKPHOS 66 09/02/2022 0903   AST 25 09/02/2022 0903   ALT 26 09/02/2022 0903   BILITOT 0.3 09/02/2022 0903      Impression and Plan: [name] is a very charming  66 year old postmenopausal Afro-American female.  She has a invasive ductal carcinoma of the right breast.  She underwent a mastectomy.  She had 1 lymph node positive.  The tumor is ER -/PR-.  Unfortunately, her breast cancer is HER2 positive.  We have her on adjuvant therapy with Kadcyla.  She did not wish to have any chemotherapy.  However, she would take Kadcyla.  We will go ahead with her 4th cycle of Kadcyla.  She will need a year of Kadcyla.  I will plan to get her back in 3 more weeks for 5th cycle.  Her initial echocardiogram was done back in June.  As such, I do not think we need another 1 probably until November or December.  She is planning on going to a nice family reunion this weekend.  I am sure that she will have a wonderful time.  I told her to make sure that she stays well-hydrated.   Volanda Napoleon, MD 9/8/202310:23 AM

## 2022-09-02 NOTE — Patient Instructions (Signed)

## 2022-09-03 ENCOUNTER — Other Ambulatory Visit: Payer: Self-pay

## 2022-09-04 ENCOUNTER — Other Ambulatory Visit: Payer: Self-pay

## 2022-09-05 ENCOUNTER — Encounter: Payer: Self-pay | Admitting: Hematology & Oncology

## 2022-09-05 NOTE — Progress Notes (Signed)
Oncology Nurse Navigator Documentation     09/02/2022   10:00 AM  Oncology Nurse Navigator Flowsheets  Navigator Follow Up Date: 09/28/2022  Navigator Follow Up Reason: Follow-up Appointment;Chemotherapy  Navigator Location [facility]  Navigator Encounter Type Follow-up Appt;Appt/Treatment Plan Review  Patient Visit Type MedOnc  Treatment Phase Active Tx  Barriers/Navigation Needs No Barriers At This Time  Interventions Psycho-Social Support  Acuity Level 1-No Barriers  Support Groups/Services Friends and Family  Time Spent with Patient 15

## 2022-09-17 ENCOUNTER — Other Ambulatory Visit: Payer: Self-pay

## 2022-09-28 ENCOUNTER — Inpatient Hospital Stay (HOSPITAL_BASED_OUTPATIENT_CLINIC_OR_DEPARTMENT_OTHER): Payer: Medicare Other | Admitting: Hematology & Oncology

## 2022-09-28 ENCOUNTER — Inpatient Hospital Stay: Payer: Medicare Other | Attending: Hematology & Oncology

## 2022-09-28 ENCOUNTER — Other Ambulatory Visit: Payer: Self-pay

## 2022-09-28 ENCOUNTER — Encounter: Payer: Self-pay | Admitting: Hematology & Oncology

## 2022-09-28 ENCOUNTER — Inpatient Hospital Stay: Payer: Medicare Other

## 2022-09-28 ENCOUNTER — Encounter: Payer: Self-pay | Admitting: *Deleted

## 2022-09-28 VITALS — BP 125/60 | HR 55 | Resp 17

## 2022-09-28 VITALS — BP 152/56 | HR 59 | Temp 98.1°F | Resp 18 | Ht 66.0 in | Wt 194.0 lb

## 2022-09-28 DIAGNOSIS — C50911 Malignant neoplasm of unspecified site of right female breast: Secondary | ICD-10-CM

## 2022-09-28 DIAGNOSIS — Z5112 Encounter for antineoplastic immunotherapy: Secondary | ICD-10-CM | POA: Diagnosis present

## 2022-09-28 DIAGNOSIS — R197 Diarrhea, unspecified: Secondary | ICD-10-CM | POA: Diagnosis not present

## 2022-09-28 DIAGNOSIS — Z7901 Long term (current) use of anticoagulants: Secondary | ICD-10-CM | POA: Insufficient documentation

## 2022-09-28 DIAGNOSIS — Z171 Estrogen receptor negative status [ER-]: Secondary | ICD-10-CM | POA: Insufficient documentation

## 2022-09-28 DIAGNOSIS — Z9011 Acquired absence of right breast and nipple: Secondary | ICD-10-CM | POA: Insufficient documentation

## 2022-09-28 DIAGNOSIS — Z7984 Long term (current) use of oral hypoglycemic drugs: Secondary | ICD-10-CM | POA: Insufficient documentation

## 2022-09-28 DIAGNOSIS — Z79899 Other long term (current) drug therapy: Secondary | ICD-10-CM | POA: Diagnosis not present

## 2022-09-28 LAB — CBC WITH DIFFERENTIAL (CANCER CENTER ONLY)
Abs Immature Granulocytes: 0.02 10*3/uL (ref 0.00–0.07)
Basophils Absolute: 0.1 10*3/uL (ref 0.0–0.1)
Basophils Relative: 1 %
Eosinophils Absolute: 0.1 10*3/uL (ref 0.0–0.5)
Eosinophils Relative: 1 %
HCT: 29.1 % — ABNORMAL LOW (ref 36.0–46.0)
Hemoglobin: 9.7 g/dL — ABNORMAL LOW (ref 12.0–15.0)
Immature Granulocytes: 0 %
Lymphocytes Relative: 21 %
Lymphs Abs: 2 10*3/uL (ref 0.7–4.0)
MCH: 29.6 pg (ref 26.0–34.0)
MCHC: 33.3 g/dL (ref 30.0–36.0)
MCV: 88.7 fL (ref 80.0–100.0)
Monocytes Absolute: 0.7 10*3/uL (ref 0.1–1.0)
Monocytes Relative: 7 %
Neutro Abs: 6.4 10*3/uL (ref 1.7–7.7)
Neutrophils Relative %: 70 %
Platelet Count: 286 10*3/uL (ref 150–400)
RBC: 3.28 MIL/uL — ABNORMAL LOW (ref 3.87–5.11)
RDW: 13.2 % (ref 11.5–15.5)
WBC Count: 9.2 10*3/uL (ref 4.0–10.5)
nRBC: 0 % (ref 0.0–0.2)

## 2022-09-28 LAB — CMP (CANCER CENTER ONLY)
ALT: 17 U/L (ref 0–44)
AST: 24 U/L (ref 15–41)
Albumin: 4 g/dL (ref 3.5–5.0)
Alkaline Phosphatase: 57 U/L (ref 38–126)
Anion gap: 8 (ref 5–15)
BUN: 18 mg/dL (ref 8–23)
CO2: 28 mmol/L (ref 22–32)
Calcium: 9.5 mg/dL (ref 8.9–10.3)
Chloride: 97 mmol/L — ABNORMAL LOW (ref 98–111)
Creatinine: 1.24 mg/dL — ABNORMAL HIGH (ref 0.44–1.00)
GFR, Estimated: 47 mL/min — ABNORMAL LOW (ref >60)
Glucose, Bld: 110 mg/dL — ABNORMAL HIGH (ref 70–99)
Potassium: 3.8 mmol/L (ref 3.5–5.1)
Sodium: 133 mmol/L — ABNORMAL LOW (ref 135–145)
Total Bilirubin: 0.4 mg/dL (ref 0.3–1.2)
Total Protein: 7.6 g/dL (ref 6.5–8.1)

## 2022-09-28 LAB — LACTATE DEHYDROGENASE: LDH: 230 U/L — ABNORMAL HIGH (ref 98–192)

## 2022-09-28 MED ORDER — HEPARIN SOD (PORK) LOCK FLUSH 100 UNIT/ML IV SOLN
500.0000 [IU] | Freq: Once | INTRAVENOUS | Status: AC | PRN
  Administered 2022-09-28: 500 [IU]

## 2022-09-28 MED ORDER — PROCHLORPERAZINE MALEATE 10 MG PO TABS
10.0000 mg | ORAL_TABLET | Freq: Once | ORAL | Status: AC
  Administered 2022-09-28: 10 mg via ORAL
  Filled 2022-09-28: qty 1

## 2022-09-28 MED ORDER — SODIUM CHLORIDE 0.9 % IV SOLN: Freq: Once | INTRAVENOUS | Status: AC

## 2022-09-28 MED ORDER — ACETAMINOPHEN 325 MG PO TABS
650.0000 mg | ORAL_TABLET | Freq: Once | ORAL | Status: AC
  Administered 2022-09-28: 650 mg via ORAL
  Filled 2022-09-28: qty 2

## 2022-09-28 MED ORDER — SODIUM CHLORIDE 0.9% FLUSH
10.0000 mL | INTRAVENOUS | Status: DC | PRN
  Administered 2022-09-28: 10 mL

## 2022-09-28 MED ORDER — DIPHENHYDRAMINE HCL 25 MG PO CAPS
50.0000 mg | ORAL_CAPSULE | Freq: Once | ORAL | Status: AC
  Administered 2022-09-28: 50 mg via ORAL
  Filled 2022-09-28: qty 2

## 2022-09-28 MED ORDER — SODIUM CHLORIDE 0.9 % IV SOLN
3.6000 mg/kg | Freq: Once | INTRAVENOUS | Status: AC
  Administered 2022-09-28: 320 mg via INTRAVENOUS
  Filled 2022-09-28: qty 16

## 2022-09-28 NOTE — Progress Notes (Signed)
Oncology Nurse Navigator Documentation     09/28/2022   10:45 AM  Oncology Nurse Navigator Flowsheets  Navigator Follow Up Date: 10/24/2022  Navigator Follow Up Reason: Follow-up Appointment;Chemotherapy  Navigator Location [facility]  Navigator Encounter Type Treatment;Appt/Treatment Plan Review  Patient Visit Type MedOnc  Treatment Phase Active Tx  Barriers/Navigation Needs No Barriers At This Time  Interventions Psycho-Social Support  Acuity Level 1-No Barriers  Support Groups/Services Friends and Family  Time Spent with Patient 15

## 2022-09-28 NOTE — Patient Instructions (Signed)
[facility]  Discharge Instructions: Thank you for choosing [facility] to provide your oncology and hematology care.   If you have a lab appointment with the Cancer Center, please go directly to the Cancer Center and check in at the registration area.  Wear comfortable clothing and clothing appropriate for easy access to any Portacath or PICC line.   We strive to give you quality time with your provider. You may need to reschedule your appointment if you arrive late (15 or more minutes).  Arriving late affects you and other patients whose appointments are after yours.  Also, if you miss three or more appointments without notifying the office, you may be dismissed from the clinic at the provider's discretion.      For prescription refill requests, have your pharmacy contact our office and allow 72 hours for refills to be completed.    Today you received the following chemotherapy and/or immunotherapy agents Kadcyla      To help prevent nausea and vomiting after your treatment, we encourage you to take your nausea medication as directed.  BELOW ARE SYMPTOMS THAT SHOULD BE REPORTED IMMEDIATELY: *FEVER GREATER THAN 100.4 F (38 C) OR HIGHER *CHILLS OR SWEATING *NAUSEA AND VOMITING THAT IS NOT CONTROLLED WITH YOUR NAUSEA MEDICATION *UNUSUAL SHORTNESS OF BREATH *UNUSUAL BRUISING OR BLEEDING *URINARY PROBLEMS (pain or burning when urinating, or frequent urination) *BOWEL PROBLEMS (unusual diarrhea, constipation, pain near the anus) TENDERNESS IN MOUTH AND THROAT WITH OR WITHOUT PRESENCE OF ULCERS (sore throat, sores in mouth, or a toothache) UNUSUAL RASH, SWELLING OR PAIN  UNUSUAL VAGINAL DISCHARGE OR ITCHING   Items with * indicate a potential emergency and should be followed up as soon as possible or go to the Emergency Department if any problems should occur.  Please show the CHEMOTHERAPY ALERT CARD or IMMUNOTHERAPY ALERT CARD at check-in to the  Emergency Department and triage nurse. Should you have questions after your visit or need to cancel or reschedule your appointment, please contact [facility]  [phone] and follow the prompts.  Office hours are 8:00 a.m. to 4:30 p.m. Monday - Friday. Please note that voicemails left after 4:00 p.m. may not be returned until the following business day.  We are closed weekends and major holidays. You have access to a nurse at all times for urgent questions. Please call the main number to the clinic [phone] and follow the prompts.  For any non-urgent questions, you may also contact your provider using MyChart. We now offer e-Visits for anyone 18 and older to request care online for non-urgent symptoms. For details visit [email or web address].   Also download the MyChart app! Go to the app store, search "MyChart", open the app, select , and log in with your MyChart username and password.  Masks are optional in the cancer centers. If you would like for your care team to wear a mask while they are taking care of you, please let them know. You may have one support person who is at least 68 years old accompany you for your appointments. 

## 2022-09-28 NOTE — Progress Notes (Signed)
Hematology and Oncology Follow Up Visit  Dorotha Hirschi [id number] [birth year] 66 y.o. 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 Reitan is back for her follow-up.  So far, the Kadcyla is doing quite nicely.  She is having little bit of diarrhea.  Otherwise, she really has had no side effects.  She has had no cough or shortness of breath.  She has had no nausea or vomiting.  She has had no urinary difficulties.  There is no bleeding.  She has had no rashes.  She has had no leg swelling.  There is been no headache.  She does complain a little bit of pain on the right lateral chest wall.  She has had a mastectomy.  She has had on occasion, she has pain that go across the chest wall.  There has been no swelling.  She has had no redness.  She has had no fever.  She has had no leg swelling.  Is been no lymphedema in the arm.  Overall, I would say her performance status is probably ECOG 1.   Medications:  Current Outpatient Medications:    albuterol (VENTOLIN HFA) 108 (90 Base) MCG/ACT inhaler, Inhale 2 puffs into the lungs every 6 (six) hours as needed., Disp: , Rfl:    ALPRAZolam (XANAX) 0.5 MG tablet, Take 0.5 mg by mouth 2 (two) times daily as needed., Disp: , Rfl:    amLODipine (NORVASC) 10 MG tablet, Take 10 mg by mouth daily., Disp: , Rfl:    atorvastatin (LIPITOR) 10 MG tablet, Take 5 mg by mouth at bedtime., Disp: , Rfl:    benazepril (LOTENSIN) 40 MG tablet, Take 40 mg by mouth daily., Disp: , Rfl:    doxazosin (CARDURA) 2 MG tablet, Take 2 mg by mouth daily as needed., Disp: , Rfl:    ELIQUIS 5 MG TABS tablet, Take 5 mg by mouth 2 (two) times daily., Disp: , Rfl:    Ferrous Sulfate (IRON) 325 (65 Fe) MG TABS, Take 1 tablet by mouth daily., Disp: , Rfl:     gabapentin (NEURONTIN) 600 MG tablet, Take 600 mg by mouth at bedtime., Disp: , Rfl:    hydrALAZINE (APRESOLINE) 50 MG tablet, Take 50 mg by mouth 2 (two) times daily., Disp: , Rfl:    HYDROcodone-acetaminophen (NORCO/VICODIN) 5-325 MG tablet, Take 2 tablets by mouth 2 (two) times daily., Disp: , Rfl:    metFORMIN (GLUCOPHAGE) 1000 MG tablet, Take 1,000 mg by mouth 2 (two) times daily., Disp: , Rfl:    metoprolol succinate (TOPROL-XL) 100 MG 24 hr tablet, Take 100 mg by mouth daily., Disp: , Rfl:    ondansetron (ZOFRAN-ODT) 4 MG disintegrating tablet, Take 1 tablet (4 mg total) by mouth every 8 (eight) hours as needed for nausea or vomiting., Disp: 20 tablet, Rfl: 0   oxyCODONE (OXY IR/ROXICODONE) 5 MG immediate release tablet, Take 1 tablet (5 mg total) by mouth every 6 (six) hours as needed for moderate pain, severe pain or breakthrough pain., Disp: 25 tablet, Rfl: 0   sertraline (ZOLOFT) 25 MG tablet, Take 1 tablet (25 mg total) by mouth daily., Disp: 30 tablet, Rfl: 3   triamterene-hydrochlorothiazide (MAXZIDE) 75-50 MG per tablet, Take 1 tablet by mouth daily., Disp: , Rfl:    Vitamin D,  Ergocalciferol, (DRISDOL) 1.25 MG (50000 UNIT) CAPS capsule, Take 50,000 Units by mouth once a week., Disp: , Rfl:  No current facility-administered medications for this visit.  Facility-Administered Medications Ordered in Other Visits:    diphenhydrAMINE (BENADRYL) 25 mg capsule, , , ,   Allergies:  Allergies  Allergen Reactions   Levaquin [Levofloxacin] Nausea And Vomiting and Other (See Comments)    dizziness    Past Medical History, Surgical history, Social history, and Family History were reviewed and updated.  Review of Systems: Review of Systems  Constitutional: Negative.   HENT:  Negative.    Eyes: Negative.   Respiratory: Negative.    Cardiovascular: Negative.   Gastrointestinal: Negative.   Endocrine: Negative.   Genitourinary: Negative.    Musculoskeletal: Negative.   Skin:  Negative.   Neurological: Negative.   Hematological: Negative.   Psychiatric/Behavioral: Negative.      Physical Exam:  height is 5' 6"  (1.676 m) and weight is 194 lb (88 kg). Her oral temperature is 98.1 F (36.7 C). Her blood pressure is 152/56 (abnormal) and her pulse is 59 (abnormal). Her respiration is 18 and oxygen saturation is 100%.   Wt Readings from Last 3 Encounters:  09/28/22 194 lb (88 kg)  09/02/22 194 lb 6.4 oz (88.2 kg)  08/12/22 193 lb (87.5 kg)    Physical Exam Vitals reviewed.  Constitutional:      Comments: Chest wall exam shows right mastectomy.  This is healing very nicely.  She has no erythema or nodularity.  There is no right axillary adenopathy.  Left breast shows no masses, edema or erythema.  She has had no nodularity or inflammation or swelling.  There is no erythema.  There is no left axillary adenopathy.  HENT:     Head: Normocephalic and atraumatic.  Eyes:     Pupils: Pupils are equal, round, and reactive to light.  Cardiovascular:     Rate and Rhythm: Normal rate and regular rhythm.     Heart sounds: Normal heart sounds.  Pulmonary:     Effort: Pulmonary effort is normal.     Breath sounds: Normal breath sounds.  Abdominal:     General: Bowel sounds are normal.     Palpations: Abdomen is soft.  Musculoskeletal:        General: No tenderness or deformity. Normal range of motion.     Cervical back: Normal range of motion.  Lymphadenopathy:     Cervical: No cervical adenopathy.  Skin:    General: Skin is warm and dry.     Findings: No erythema or rash.  Neurological:     Mental Status: She is alert and oriented to person, place, and time.  Psychiatric:        Behavior: Behavior normal.        Thought Content: Thought content normal.        Judgment: Judgment normal.     Lab Results  Component Value Date   WBC 9.2 09/28/2022   HGB 9.7 (L) 09/28/2022   HCT 29.1 (L) 09/28/2022   MCV 88.7 09/28/2022   PLT 286 09/28/2022     Chemistry       Component Value Date/Time   NA 133 (L) 09/28/2022 0910   K 3.8 09/28/2022 0910   CL 97 (L) 09/28/2022 0910   CO2 28 09/28/2022 0910   BUN 18 09/28/2022 0910   CREATININE 1.24 (H) 09/28/2022 0910      Component Value Date/Time   CALCIUM 9.5 09/28/2022 0910  ALKPHOS 57 09/28/2022 0910   AST 24 09/28/2022 0910   ALT 17 09/28/2022 0910   BILITOT 0.4 09/28/2022 0910      Impression and Plan: Ms. Melikian is a very charming 66 year old postmenopausal Afro-American female.  She has a invasive ductal carcinoma of the right breast.  She underwent a mastectomy.  She had 1 lymph node positive.  The tumor is ER -/PR-.  Unfortunately, her breast cancer is HER2 positive.  We have her on adjuvant therapy with Kadcyla.  She did not wish to have any chemotherapy.  However, she would take Kadcyla.  We will go ahead with her 5th cycle of Kadcyla.  She will need a year of Kadcyla.  I am not sure why these she is having this discomfort.  It is not all the time.  I cannot feel anything in the region of the right chest wall or the anterior chest wall on the right side.  We will continue on treatment.  I will plan to get her back in 3-4 more weeks for her sixth cycle of treatment.     Volanda Napoleon, MD 10/4/202310:02 AM

## 2022-09-30 ENCOUNTER — Other Ambulatory Visit: Payer: Self-pay

## 2022-10-24 ENCOUNTER — Other Ambulatory Visit: Payer: Self-pay | Admitting: *Deleted

## 2022-10-24 ENCOUNTER — Inpatient Hospital Stay: Payer: Medicare Other

## 2022-10-24 ENCOUNTER — Encounter: Payer: Self-pay | Admitting: Hematology & Oncology

## 2022-10-24 ENCOUNTER — Inpatient Hospital Stay (HOSPITAL_BASED_OUTPATIENT_CLINIC_OR_DEPARTMENT_OTHER): Payer: Medicare Other | Admitting: Hematology & Oncology

## 2022-10-24 VITALS — BP 134/58 | HR 62 | Resp 18

## 2022-10-24 DIAGNOSIS — Z5112 Encounter for antineoplastic immunotherapy: Secondary | ICD-10-CM | POA: Diagnosis not present

## 2022-10-24 DIAGNOSIS — Z171 Estrogen receptor negative status [ER-]: Secondary | ICD-10-CM

## 2022-10-24 DIAGNOSIS — C50911 Malignant neoplasm of unspecified site of right female breast: Secondary | ICD-10-CM

## 2022-10-24 LAB — CMP (CANCER CENTER ONLY)
ALT: 14 U/L (ref 0–44)
AST: 20 U/L (ref 15–41)
Albumin: 3.9 g/dL (ref 3.5–5.0)
Alkaline Phosphatase: 53 U/L (ref 38–126)
Anion gap: 8 (ref 5–15)
BUN: 31 mg/dL — ABNORMAL HIGH (ref 8–23)
CO2: 28 mmol/L (ref 22–32)
Calcium: 9.8 mg/dL (ref 8.9–10.3)
Chloride: 100 mmol/L (ref 98–111)
Creatinine: 1.37 mg/dL — ABNORMAL HIGH (ref 0.44–1.00)
GFR, Estimated: 42 mL/min — ABNORMAL LOW (ref >60)
Glucose, Bld: 104 mg/dL — ABNORMAL HIGH (ref 70–99)
Potassium: 3.7 mmol/L (ref 3.5–5.1)
Sodium: 136 mmol/L (ref 135–145)
Total Bilirubin: 0.3 mg/dL (ref 0.3–1.2)
Total Protein: 7.1 g/dL (ref 6.5–8.1)

## 2022-10-24 LAB — CBC WITH DIFFERENTIAL (CANCER CENTER ONLY)
Abs Immature Granulocytes: 0.03 10*3/uL (ref 0.00–0.07)
Basophils Absolute: 0.1 10*3/uL (ref 0.0–0.1)
Basophils Relative: 1 %
Eosinophils Absolute: 0.1 10*3/uL (ref 0.0–0.5)
Eosinophils Relative: 1 %
HCT: 30.2 % — ABNORMAL LOW (ref 36.0–46.0)
Hemoglobin: 10 g/dL — ABNORMAL LOW (ref 12.0–15.0)
Immature Granulocytes: 0 %
Lymphocytes Relative: 20 %
Lymphs Abs: 1.9 10*3/uL (ref 0.7–4.0)
MCH: 30.2 pg (ref 26.0–34.0)
MCHC: 33.1 g/dL (ref 30.0–36.0)
MCV: 91.2 fL (ref 80.0–100.0)
Monocytes Absolute: 0.6 10*3/uL (ref 0.1–1.0)
Monocytes Relative: 7 %
Neutro Abs: 6.8 10*3/uL (ref 1.7–7.7)
Neutrophils Relative %: 71 %
Platelet Count: 272 10*3/uL (ref 150–400)
RBC: 3.31 MIL/uL — ABNORMAL LOW (ref 3.87–5.11)
RDW: 13.6 % (ref 11.5–15.5)
WBC Count: 9.4 10*3/uL (ref 4.0–10.5)
nRBC: 0 % (ref 0.0–0.2)

## 2022-10-24 LAB — LACTATE DEHYDROGENASE: LDH: 192 U/L (ref 98–192)

## 2022-10-24 LAB — IRON AND IRON BINDING CAPACITY (CC-WL,HP ONLY)
Iron: 64 ug/dL (ref 28–170)
Saturation Ratios: 19 % (ref 10.4–31.8)
TIBC: 339 ug/dL (ref 250–450)
UIBC: 275 ug/dL (ref 148–442)

## 2022-10-24 LAB — FERRITIN: Ferritin: 229 ng/mL (ref 11–307)

## 2022-10-24 MED ORDER — DIPHENHYDRAMINE HCL 25 MG PO CAPS
50.0000 mg | ORAL_CAPSULE | Freq: Once | ORAL | Status: AC
  Administered 2022-10-24: 50 mg via ORAL
  Filled 2022-10-24: qty 2

## 2022-10-24 MED ORDER — PROCHLORPERAZINE EDISYLATE 10 MG/2ML IJ SOLN: 10.0000 mg | Freq: Once | INTRAMUSCULAR | Status: DC

## 2022-10-24 MED ORDER — PROCHLORPERAZINE MALEATE 10 MG PO TABS
10.0000 mg | ORAL_TABLET | Freq: Once | ORAL | Status: AC
  Administered 2022-10-24: 10 mg via ORAL
  Filled 2022-10-24: qty 1

## 2022-10-24 MED ORDER — SODIUM CHLORIDE 0.9 % IV SOLN: Freq: Once | INTRAVENOUS | Status: AC

## 2022-10-24 MED ORDER — SODIUM CHLORIDE 0.9% FLUSH
10.0000 mL | INTRAVENOUS | Status: DC | PRN
  Administered 2022-10-24: 10 mL

## 2022-10-24 MED ORDER — SODIUM CHLORIDE 0.9 % IV SOLN
3.6000 mg/kg | Freq: Once | INTRAVENOUS | Status: AC
  Administered 2022-10-24: 320 mg via INTRAVENOUS
  Filled 2022-10-24: qty 16

## 2022-10-24 MED ORDER — HEPARIN SOD (PORK) LOCK FLUSH 100 UNIT/ML IV SOLN
500.0000 [IU] | Freq: Once | INTRAVENOUS | Status: AC | PRN
  Administered 2022-10-24: 500 [IU]

## 2022-10-24 MED ORDER — ACETAMINOPHEN 325 MG PO TABS
650.0000 mg | ORAL_TABLET | Freq: Once | ORAL | Status: AC
  Administered 2022-10-24: 650 mg via ORAL
  Filled 2022-10-24: qty 2

## 2022-10-24 NOTE — Progress Notes (Signed)
Hematology and Oncology Follow Up Visit  Brandi Roberts [id number] March 16, 1956 66 y.o. 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  So far, the Kadcyla is doing quite nicely.  She is having little bit of diarrhea.  Otherwise, she really has had no side effects.  She has had no cough or shortness of breath.  She has had no nausea or vomiting.  She has had no urinary difficulties.  There is no bleeding.  She has had no rashes.  She has had no leg swelling.  There is been no headache.  She does complain a little bit of pain on the right lateral chest wall.  She has had a mastectomy.  She has had on occasion, she has pain that go across the chest wall.  There has been no swelling.  She has had no redness.  She has had no fever.  She is supposed to see Dr. Ninfa Linden, her surgeon for this.  She has had no leg swelling.  Is been no lymphedema in the arm.  Overall, I would say her performance status is probably ECOG 1.   Medications:  Current Outpatient Medications:    albuterol (VENTOLIN HFA) 108 (90 Base) MCG/ACT inhaler, Inhale 2 puffs into the lungs every 6 (six) hours as needed., Disp: , Rfl:    ALPRAZolam (XANAX) 0.5 MG tablet, Take 0.5 mg by mouth 2 (two) times daily as needed., Disp: , Rfl:    amLODipine (NORVASC) 10 MG tablet, Take 10 mg by mouth daily., Disp: , Rfl:    atorvastatin (LIPITOR) 10 MG tablet, Take 5 mg by mouth at bedtime., Disp: , Rfl:    benazepril (LOTENSIN) 40 MG tablet, Take 40 mg by mouth daily., Disp: , Rfl:    doxazosin (CARDURA) 2 MG tablet, Take 2 mg by mouth 2 (two) times daily., Disp: , Rfl:    ELIQUIS 5 MG TABS tablet, Take 5 mg by mouth 2 (two) times daily., Disp: , Rfl:    Ferrous Sulfate (IRON) 325 (65 Fe)  MG TABS, Take 1 tablet by mouth daily., Disp: , Rfl:    gabapentin (NEURONTIN) 600 MG tablet, Take 600 mg by mouth at bedtime., Disp: , Rfl:    hydrALAZINE (APRESOLINE) 50 MG tablet, Take 50 mg by mouth 2 (two) times daily., Disp: , Rfl:    HYDROcodone-acetaminophen (NORCO/VICODIN) 5-325 MG tablet, Take 2 tablets by mouth 2 (two) times daily., Disp: , Rfl:    metFORMIN (GLUCOPHAGE) 1000 MG tablet, Take 1,000 mg by mouth 2 (two) times daily., Disp: , Rfl:    metoprolol succinate (TOPROL-XL) 100 MG 24 hr tablet, Take 100 mg by mouth daily., Disp: , Rfl:    sertraline (ZOLOFT) 25 MG tablet, Take 1 tablet (25 mg total) by mouth daily., Disp: 30 tablet, Rfl: 3   triamterene-hydrochlorothiazide (MAXZIDE) 75-50 MG per tablet, Take 1 tablet by mouth daily., Disp: , Rfl:    Vitamin D, Ergocalciferol, (DRISDOL) 1.25 MG (50000 UNIT) CAPS capsule, Take 50,000 Units by mouth once a week., Disp: , Rfl:    ondansetron (ZOFRAN-ODT) 4 MG disintegrating tablet, Take 1 tablet (4 mg total) by mouth every 8 (eight) hours as needed for nausea or vomiting. (Patient not taking: Reported on 10/24/2022), Disp: 20 tablet,  Rfl: 0 No current facility-administered medications for this visit.  Facility-Administered Medications Ordered in Other Visits:    diphenhydrAMINE (BENADRYL) 25 mg capsule, , , ,   Allergies:  Allergies  Allergen Reactions   Levaquin [Levofloxacin] Nausea And Vomiting and Other (See Comments)    dizziness    Past Medical History, Surgical history, Social history, and Family History were reviewed and updated.  Review of Systems: Review of Systems  Constitutional: Negative.   HENT:  Negative.    Eyes: Negative.   Respiratory: Negative.    Cardiovascular: Negative.   Gastrointestinal: Negative.   Endocrine: Negative.   Genitourinary: Negative.    Musculoskeletal: Negative.   Skin: Negative.   Neurological: Negative.   Hematological: Negative.   Psychiatric/Behavioral: Negative.       Physical Exam:  height is _0  (1.676 m) and weight is 194 lb (88 kg). Her oral temperature is 97.9 F (36.6 C). Her blood pressure is 150/59 (abnormal) and her pulse is 75. Her respiration is 20 and oxygen saturation is 100%.   Wt Readings from Last 3 Encounters:  10/24/22 194 lb (88 kg)  09/28/22 194 lb (88 kg)  09/02/22 194 lb 6.4 oz (88.2 kg)    Physical Exam Vitals reviewed.  Constitutional:      Comments: Chest wall exam shows right mastectomy.  This is healing very nicely.  She has no erythema or nodularity.  There is no right axillary adenopathy.  Left breast shows no masses, edema or erythema.  She has had no nodularity or inflammation or swelling.  There is no erythema.  There is no left axillary adenopathy.  HENT:     Head: Normocephalic and atraumatic.  Eyes:     Pupils: Pupils are equal, round, and reactive to light.  Cardiovascular:     Rate and Rhythm: Normal rate and regular rhythm.     Heart sounds: Normal heart sounds.  Pulmonary:     Effort: Pulmonary effort is normal.     Breath sounds: Normal breath sounds.  Abdominal:     General: Bowel sounds are normal.     Palpations: Abdomen is soft.  Musculoskeletal:        General: No tenderness or deformity. Normal range of motion.     Cervical back: Normal range of motion.  Lymphadenopathy:     Cervical: No cervical adenopathy.  Skin:    General: Skin is warm and dry.     Findings: No erythema or rash.  Neurological:     Mental Status: She is alert and oriented to person, place, and time.  Psychiatric:        Behavior: Behavior normal.        Thought Content: Thought content normal.        Judgment: Judgment normal.      Lab Results  Component Value Date   WBC 9.4 10/24/2022   HGB 10.0 (L) 10/24/2022   HCT 30.2 (L) 10/24/2022   MCV 91.2 10/24/2022   PLT 272 10/24/2022     Chemistry      Component Value Date/Time   NA 136 10/24/2022 1015   K 3.7 10/24/2022 1015   CL 100 10/24/2022 1015    CO2 28 10/24/2022 1015   BUN 31 (H) 10/24/2022 1015   CREATININE 1.37 (H) 10/24/2022 1015      Component Value Date/Time   CALCIUM 9.8 10/24/2022 1015   ALKPHOS 53 10/24/2022 1015   AST 20 10/24/2022 1015   ALT 14 10/24/2022 1015  BILITOT 0.3 10/24/2022 1015      Impression and Plan: Ms. Lucy is a very charming 66 year old postmenopausal Afro-American female.  She has a invasive ductal carcinoma of the right breast.  She underwent a mastectomy.  She had 1 lymph node positive.  The tumor is ER -/PR-.  Unfortunately, her breast cancer is HER2 positive.  We have her on adjuvant therapy with Kadcyla.  She did not wish to have any chemotherapy.  However, she would take Kadcyla.  We will go ahead with her 6th cycle of Kadcyla.  She will need a year of Kadcyla.   We will continue on treatment.  I will plan to get her back in 3-4 more weeks for her sixth cycle of treatment.     Volanda Napoleon, MD 10/30/202311:19 AM

## 2022-10-24 NOTE — Patient Instructions (Signed)

## 2022-10-24 NOTE — Patient Instructions (Signed)
[facility]  Discharge Instructions: Thank you for choosing [facility] to provide your oncology and hematology care.   If you have a lab appointment with the Cancer Center, please go directly to the Cancer Center and check in at the registration area.  Wear comfortable clothing and clothing appropriate for easy access to any Portacath or PICC line.   We strive to give you quality time with your provider. You may need to reschedule your appointment if you arrive late (15 or more minutes).  Arriving late affects you and other patients whose appointments are after yours.  Also, if you miss three or more appointments without notifying the office, you may be dismissed from the clinic at the provider's discretion.      For prescription refill requests, have your pharmacy contact our office and allow 72 hours for refills to be completed.    Today you received the following chemotherapy and/or immunotherapy agents Kadcyla      To help prevent nausea and vomiting after your treatment, we encourage you to take your nausea medication as directed.  BELOW ARE SYMPTOMS THAT SHOULD BE REPORTED IMMEDIATELY: *FEVER GREATER THAN 100.4 F (38 C) OR HIGHER *CHILLS OR SWEATING *NAUSEA AND VOMITING THAT IS NOT CONTROLLED WITH YOUR NAUSEA MEDICATION *UNUSUAL SHORTNESS OF BREATH *UNUSUAL BRUISING OR BLEEDING *URINARY PROBLEMS (pain or burning when urinating, or frequent urination) *BOWEL PROBLEMS (unusual diarrhea, constipation, pain near the anus) TENDERNESS IN MOUTH AND THROAT WITH OR WITHOUT PRESENCE OF ULCERS (sore throat, sores in mouth, or a toothache) UNUSUAL RASH, SWELLING OR PAIN  UNUSUAL VAGINAL DISCHARGE OR ITCHING   Items with * indicate a potential emergency and should be followed up as soon as possible or go to the Emergency Department if any problems should occur.  Please show the CHEMOTHERAPY ALERT CARD or IMMUNOTHERAPY ALERT CARD at check-in to the  Emergency Department and triage nurse. Should you have questions after your visit or need to cancel or reschedule your appointment, please contact [facility]  [phone] and follow the prompts.  Office hours are 8:00 a.m. to 4:30 p.m. Monday - Friday. Please note that voicemails left after 4:00 p.m. may not be returned until the following business day.  We are closed weekends and major holidays. You have access to a nurse at all times for urgent questions. Please call the main number to the clinic [phone] and follow the prompts.  For any non-urgent questions, you may also contact your provider using MyChart. We now offer e-Visits for anyone 18 and older to request care online for non-urgent symptoms. For details visit [email or web address].   Also download the MyChart app! Go to the app store, search "MyChart", open the app, select [facility], and log in with your MyChart username and password.  Masks are optional in the cancer centers. If you would like for your care team to wear a mask while they are taking care of you, please let them know. You may have one support person who is at least 68 years old accompany you for your appointments. 

## 2022-10-25 ENCOUNTER — Other Ambulatory Visit: Payer: Self-pay

## 2022-10-25 ENCOUNTER — Encounter: Payer: Self-pay | Admitting: *Deleted

## 2022-10-25 LAB — ERYTHROPOIETIN: Erythropoietin: 12.9 m[IU]/mL (ref 2.6–18.5)

## 2022-10-25 NOTE — Progress Notes (Signed)
Oncology Nurse Navigator Documentation     10/25/2022    8:30 AM  Oncology Nurse Navigator Flowsheets  Navigator Follow Up Date: 11/14/2022  Navigator Follow Up Reason: Follow-up Appointment;Chemotherapy  Navigator Location [facility]  Navigator Encounter Type Appt/Treatment Plan Review  Patient Visit Type MedOnc  Treatment Phase Active Tx  Barriers/Navigation Needs No Barriers At This Time  Acuity Level 1-No Barriers  Support Groups/Services Friends and Family  Time Spent with Patient 15

## 2022-11-01 ENCOUNTER — Other Ambulatory Visit: Payer: Self-pay

## 2022-11-07 ENCOUNTER — Ambulatory Visit (HOSPITAL_COMMUNITY): Payer: Medicare Other | Attending: Hematology & Oncology

## 2022-11-07 DIAGNOSIS — Z86718 Personal history of other venous thrombosis and embolism: Secondary | ICD-10-CM | POA: Insufficient documentation

## 2022-11-07 DIAGNOSIS — Z01818 Encounter for other preprocedural examination: Secondary | ICD-10-CM | POA: Insufficient documentation

## 2022-11-07 DIAGNOSIS — C50911 Malignant neoplasm of unspecified site of right female breast: Secondary | ICD-10-CM | POA: Diagnosis not present

## 2022-11-07 DIAGNOSIS — Z87891 Personal history of nicotine dependence: Secondary | ICD-10-CM | POA: Diagnosis not present

## 2022-11-07 DIAGNOSIS — I5030 Unspecified diastolic (congestive) heart failure: Secondary | ICD-10-CM

## 2022-11-07 DIAGNOSIS — I519 Heart disease, unspecified: Secondary | ICD-10-CM | POA: Diagnosis not present

## 2022-11-07 DIAGNOSIS — I340 Nonrheumatic mitral (valve) insufficiency: Secondary | ICD-10-CM | POA: Diagnosis not present

## 2022-11-07 DIAGNOSIS — Z171 Estrogen receptor negative status [ER-]: Secondary | ICD-10-CM

## 2022-11-07 LAB — ECHOCARDIOGRAM COMPLETE
Area-P 1/2: 4.15 cm2
S' Lateral: 3.3 cm

## 2022-11-08 ENCOUNTER — Telehealth: Payer: Self-pay

## 2022-11-08 NOTE — Telephone Encounter (Signed)
-----   Message from Volanda Napoleon, MD sent at 11/07/2022  4:42 PM EST ----- Call and let her know that her heart is pumping nicely.  There is nothing that looks troublesome.  Thanks.  Laurey Arrow

## 2022-11-08 NOTE — Telephone Encounter (Signed)
Called and informed pt of echo results, she verbalized understanding and denies any other questions or concerns at this time.

## 2022-11-09 ENCOUNTER — Other Ambulatory Visit: Payer: Self-pay

## 2022-11-14 ENCOUNTER — Inpatient Hospital Stay (HOSPITAL_BASED_OUTPATIENT_CLINIC_OR_DEPARTMENT_OTHER): Payer: Medicare Other | Admitting: Hematology & Oncology

## 2022-11-14 ENCOUNTER — Inpatient Hospital Stay: Payer: Medicare Other | Attending: Hematology & Oncology

## 2022-11-14 ENCOUNTER — Inpatient Hospital Stay: Payer: Medicare Other

## 2022-11-14 ENCOUNTER — Encounter: Payer: Self-pay | Admitting: *Deleted

## 2022-11-14 ENCOUNTER — Other Ambulatory Visit: Payer: Self-pay

## 2022-11-14 ENCOUNTER — Encounter: Payer: Self-pay | Admitting: Hematology & Oncology

## 2022-11-14 VITALS — BP 181/55 | HR 71 | Temp 97.9°F | Resp 17 | Ht 66.0 in | Wt 192.0 lb

## 2022-11-14 VITALS — BP 144/53 | HR 63

## 2022-11-14 DIAGNOSIS — Z171 Estrogen receptor negative status [ER-]: Secondary | ICD-10-CM | POA: Insufficient documentation

## 2022-11-14 DIAGNOSIS — Z5112 Encounter for antineoplastic immunotherapy: Secondary | ICD-10-CM | POA: Diagnosis not present

## 2022-11-14 DIAGNOSIS — C50911 Malignant neoplasm of unspecified site of right female breast: Secondary | ICD-10-CM

## 2022-11-14 DIAGNOSIS — Z7901 Long term (current) use of anticoagulants: Secondary | ICD-10-CM | POA: Insufficient documentation

## 2022-11-14 DIAGNOSIS — R197 Diarrhea, unspecified: Secondary | ICD-10-CM | POA: Insufficient documentation

## 2022-11-14 DIAGNOSIS — Z9011 Acquired absence of right breast and nipple: Secondary | ICD-10-CM | POA: Diagnosis not present

## 2022-11-14 DIAGNOSIS — Z79899 Other long term (current) drug therapy: Secondary | ICD-10-CM | POA: Diagnosis not present

## 2022-11-14 LAB — LACTATE DEHYDROGENASE: LDH: 212 U/L — ABNORMAL HIGH (ref 98–192)

## 2022-11-14 LAB — CBC WITH DIFFERENTIAL (CANCER CENTER ONLY)
Abs Immature Granulocytes: 0.03 10*3/uL (ref 0.00–0.07)
Basophils Absolute: 0.1 10*3/uL (ref 0.0–0.1)
Basophils Relative: 1 %
Eosinophils Absolute: 0.1 10*3/uL (ref 0.0–0.5)
Eosinophils Relative: 1 %
HCT: 31.4 % — ABNORMAL LOW (ref 36.0–46.0)
Hemoglobin: 10.4 g/dL — ABNORMAL LOW (ref 12.0–15.0)
Immature Granulocytes: 0 %
Lymphocytes Relative: 22 %
Lymphs Abs: 2.4 10*3/uL (ref 0.7–4.0)
MCH: 30.1 pg (ref 26.0–34.0)
MCHC: 33.1 g/dL (ref 30.0–36.0)
MCV: 90.8 fL (ref 80.0–100.0)
Monocytes Absolute: 0.9 10*3/uL (ref 0.1–1.0)
Monocytes Relative: 8 %
Neutro Abs: 7.3 10*3/uL (ref 1.7–7.7)
Neutrophils Relative %: 68 %
Platelet Count: 307 10*3/uL (ref 150–400)
RBC: 3.46 MIL/uL — ABNORMAL LOW (ref 3.87–5.11)
RDW: 13.3 % (ref 11.5–15.5)
WBC Count: 10.7 10*3/uL — ABNORMAL HIGH (ref 4.0–10.5)
nRBC: 0 % (ref 0.0–0.2)

## 2022-11-14 LAB — CMP (CANCER CENTER ONLY)
ALT: 19 U/L (ref 0–44)
AST: 25 U/L (ref 15–41)
Albumin: 4 g/dL (ref 3.5–5.0)
Alkaline Phosphatase: 59 U/L (ref 38–126)
Anion gap: 8 (ref 5–15)
BUN: 19 mg/dL (ref 8–23)
CO2: 29 mmol/L (ref 22–32)
Calcium: 9.8 mg/dL (ref 8.9–10.3)
Chloride: 99 mmol/L (ref 98–111)
Creatinine: 1.14 mg/dL — ABNORMAL HIGH (ref 0.44–1.00)
GFR, Estimated: 52 mL/min — ABNORMAL LOW (ref >60)
Glucose, Bld: 108 mg/dL — ABNORMAL HIGH (ref 70–99)
Potassium: 3.5 mmol/L (ref 3.5–5.1)
Sodium: 136 mmol/L (ref 135–145)
Total Bilirubin: 0.3 mg/dL (ref 0.3–1.2)
Total Protein: 7.6 g/dL (ref 6.5–8.1)

## 2022-11-14 MED ORDER — DIPHENHYDRAMINE HCL 25 MG PO CAPS
50.0000 mg | ORAL_CAPSULE | Freq: Once | ORAL | Status: AC
  Administered 2022-11-14: 50 mg via ORAL
  Filled 2022-11-14: qty 2

## 2022-11-14 MED ORDER — ACETAMINOPHEN 325 MG PO TABS
650.0000 mg | ORAL_TABLET | Freq: Once | ORAL | Status: AC
  Administered 2022-11-14: 650 mg via ORAL
  Filled 2022-11-14: qty 2

## 2022-11-14 MED ORDER — HEPARIN SOD (PORK) LOCK FLUSH 100 UNIT/ML IV SOLN
500.0000 [IU] | Freq: Once | INTRAVENOUS | Status: AC | PRN
  Administered 2022-11-14: 500 [IU]

## 2022-11-14 MED ORDER — SODIUM CHLORIDE 0.9 % IV SOLN: Freq: Once | INTRAVENOUS | Status: AC

## 2022-11-14 MED ORDER — SODIUM CHLORIDE 0.9 % IV SOLN
3.6000 mg/kg | Freq: Once | INTRAVENOUS | Status: AC
  Administered 2022-11-14: 320 mg via INTRAVENOUS
  Filled 2022-11-14: qty 16

## 2022-11-14 MED ORDER — PROCHLORPERAZINE MALEATE 10 MG PO TABS
10.0000 mg | ORAL_TABLET | Freq: Once | ORAL | Status: AC
  Administered 2022-11-14: 10 mg via ORAL
  Filled 2022-11-14: qty 1

## 2022-11-14 MED ORDER — SODIUM CHLORIDE 0.9% FLUSH
10.0000 mL | INTRAVENOUS | Status: DC | PRN
  Administered 2022-11-14: 10 mL

## 2022-11-14 MED ORDER — PROCHLORPERAZINE EDISYLATE 10 MG/2ML IJ SOLN: 10.0000 mg | Freq: Once | INTRAMUSCULAR | Status: DC

## 2022-11-14 NOTE — Patient Instructions (Signed)

## 2022-11-14 NOTE — Progress Notes (Unsigned)
Patient is here for her next treatment. She's done really well with treatment with minimal side effect. No navigational needs at this time.   Oncology Nurse Navigator Documentation     11/14/2022   10:30 AM  Oncology Nurse Navigator Flowsheets  Navigator Follow Up Date: 12/05/2022  Navigator Follow Up Reason: Follow-up Appointment;Chemotherapy  Navigator Location [facility]  Navigator Encounter Type Treatment;Appt/Treatment Plan Review  Patient Visit Type MedOnc  Treatment Phase Active Tx  Barriers/Navigation Needs No Barriers At This Time  Interventions Psycho-Social Support  Acuity Level 1-No Barriers  Support Groups/Services Friends and Family  Time Spent with Patient 15

## 2022-11-14 NOTE — Progress Notes (Signed)
Hematology and Oncology Follow Up Visit  Brandi Roberts [id number] November 30, 1956 66 y.o. 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  So far, the Kadcyla is doing quite nicely.  Outside of a little diarrhea, she really has had no other side effects from the Kadcyla.  More last saw her, she did have little bit of iron deficiency.  Her ferritin was 229 with an iron saturation of 19%.  She is on some oral iron.  She does see Brandi Roberts for the left breast swelling.  He says everything seems to be doing pretty well.  She has had no fever.  She has had no bleeding.  There has been no change in bowel or bladder habits.  She has had no cough or shortness of breath.  She has had no leg swelling.  She had an echocardiogram done on 11/07/2022.  This showed an ejection fraction of 60-65%.  Overall, I would say performance status is ECOG 1.   Medications:  Current Outpatient Medications:    albuterol (VENTOLIN HFA) 108 (90 Base) MCG/ACT inhaler, Inhale 2 puffs into the lungs every 6 (six) hours as needed., Disp: , Rfl:    ALPRAZolam (XANAX) 0.5 MG tablet, Take 0.5 mg by mouth 2 (two) times daily as needed., Disp: , Rfl:    amLODipine (NORVASC) 10 MG tablet, Take 10 mg by mouth daily., Disp: , Rfl:    atorvastatin (LIPITOR) 10 MG tablet, Take 5 mg by mouth at bedtime., Disp: , Rfl:    benazepril (LOTENSIN) 40 MG tablet, Take 40 mg by mouth daily., Disp: , Rfl:    doxazosin (CARDURA) 2 MG tablet, Take 2 mg by mouth 2 (two) times daily., Disp: , Rfl:    ELIQUIS 5 MG TABS tablet, Take 5 mg by mouth 2 (two) times daily., Disp: , Rfl:    Ferrous Sulfate (IRON) 325 (65 Fe) MG TABS, Take 1 tablet by mouth daily., Disp: , Rfl:    gabapentin (NEURONTIN)  600 MG tablet, Take 600 mg by mouth at bedtime., Disp: , Rfl:    hydrALAZINE (APRESOLINE) 50 MG tablet, Take 50 mg by mouth 2 (two) times daily., Disp: , Rfl:    HYDROcodone-acetaminophen (NORCO/VICODIN) 5-325 MG tablet, Take 2 tablets by mouth 2 (two) times daily., Disp: , Rfl:    metFORMIN (GLUCOPHAGE) 1000 MG tablet, Take 1,000 mg by mouth 2 (two) times daily., Disp: , Rfl:    metoprolol succinate (TOPROL-XL) 100 MG 24 hr tablet, Take 100 mg by mouth daily., Disp: , Rfl:    ondansetron (ZOFRAN-ODT) 4 MG disintegrating tablet, Take 1 tablet (4 mg total) by mouth every 8 (eight) hours as needed for nausea or vomiting. (Patient not taking: Reported on 10/24/2022), Disp: 20 tablet, Rfl: 0   sertraline (ZOLOFT) 25 MG tablet, Take 1 tablet (25 mg total) by mouth daily., Disp: 30 tablet, Rfl: 3   triamterene-hydrochlorothiazide (MAXZIDE) 75-50 MG per tablet, Take 1 tablet by mouth daily., Disp: , Rfl:    Vitamin D, Ergocalciferol, (DRISDOL) 1.25 MG (50000 UNIT) CAPS capsule, Take 50,000 Units by mouth once a week., Disp: , Rfl:  No current facility-administered medications for this visit.  Facility-Administered Medications Ordered in Other Visits:    diphenhydrAMINE (BENADRYL) 25 mg capsule, , , ,  Allergies:  Allergies  Allergen Reactions   Levaquin [Levofloxacin] Nausea And Vomiting and Other (See Comments)    dizziness    Past Medical History, Surgical history, Social history, and Family History were reviewed and updated.  Review of Systems: Review of Systems  Constitutional: Negative.   HENT:  Negative.    Eyes: Negative.   Respiratory: Negative.    Cardiovascular: Negative.   Gastrointestinal: Negative.   Endocrine: Negative.   Genitourinary: Negative.    Musculoskeletal: Negative.   Skin: Negative.   Neurological: Negative.   Hematological: Negative.   Psychiatric/Behavioral: Negative.      Physical Exam:  height is _0  (1.676 m) and weight is 192 lb (87.1 kg). Her oral  temperature is 97.9 F (36.6 C). Her blood pressure is 181/55 (abnormal) and her pulse is 71. Her respiration is 17 and oxygen saturation is 99%.   Wt Readings from Last 3 Encounters:  11/14/22 192 lb (87.1 kg)  10/24/22 194 lb (88 kg)  09/28/22 194 lb (88 kg)    Physical Exam Vitals reviewed.  Constitutional:      Comments: Chest wall exam shows right mastectomy.  This is healing very nicely.  She has no erythema or nodularity.  There is no right axillary adenopathy.  Left breast shows no masses, edema or erythema.  She has had no nodularity or inflammation or swelling.  There is no erythema.  There is no left axillary adenopathy.  HENT:     Head: Normocephalic and atraumatic.  Eyes:     Pupils: Pupils are equal, round, and reactive to light.  Cardiovascular:     Rate and Rhythm: Normal rate and regular rhythm.     Heart sounds: Normal heart sounds.  Pulmonary:     Effort: Pulmonary effort is normal.     Breath sounds: Normal breath sounds.  Abdominal:     General: Bowel sounds are normal.     Palpations: Abdomen is soft.  Musculoskeletal:        General: No tenderness or deformity. Normal range of motion.     Cervical back: Normal range of motion.  Lymphadenopathy:     Cervical: No cervical adenopathy.  Skin:    General: Skin is warm and dry.     Findings: No erythema or rash.  Neurological:     Mental Status: She is alert and oriented to person, place, and time.  Psychiatric:        Behavior: Behavior normal.        Thought Content: Thought content normal.        Judgment: Judgment normal.      Lab Results  Component Value Date   WBC 10.7 (H) 11/14/2022   HGB 10.4 (L) 11/14/2022   HCT 31.4 (L) 11/14/2022   MCV 90.8 11/14/2022   PLT 307 11/14/2022     Chemistry      Component Value Date/Time   NA 136 10/24/2022 1015   K 3.7 10/24/2022 1015   CL 100 10/24/2022 1015   CO2 28 10/24/2022 1015   BUN 31 (H) 10/24/2022 1015   CREATININE 1.37 (H) 10/24/2022 1015       Component Value Date/Time   CALCIUM 9.8 10/24/2022 1015   ALKPHOS 53 10/24/2022 1015   AST 20 10/24/2022 1015   ALT 14 10/24/2022 1015   BILITOT 0.3 10/24/2022 1015      Impression and Plan: Ms. Knipfer is a very charming 66 year old postmenopausal Afro-American female.  She has a invasive ductal carcinoma of  the right breast.  She underwent a mastectomy.  She had 1 lymph node positive.  The tumor is ER -/PR-.  Unfortunately, her breast cancer is HER2 positive.  We have her on adjuvant therapy with Kadcyla.  She did not wish to have any chemotherapy.  However, she would take Kadcyla.  We will go ahead with Kadcyla.  This will be her seventh cycle of Kadcyla.  We will keep her on Kadcyla for 1 year.  I think we need 1 year of Kadcyla because of the positive lymph node.  We will plan to get her back in another 3 weeks.    Volanda Napoleon, MD 11/20/202310:40 AM

## 2022-11-15 ENCOUNTER — Encounter: Payer: Self-pay | Admitting: Hematology & Oncology

## 2022-11-15 ENCOUNTER — Other Ambulatory Visit: Payer: Self-pay

## 2022-11-18 ENCOUNTER — Other Ambulatory Visit: Payer: Self-pay

## 2022-11-19 ENCOUNTER — Other Ambulatory Visit: Payer: Self-pay

## 2022-12-05 ENCOUNTER — Inpatient Hospital Stay: Payer: Medicare Other

## 2022-12-05 ENCOUNTER — Inpatient Hospital Stay (HOSPITAL_BASED_OUTPATIENT_CLINIC_OR_DEPARTMENT_OTHER): Payer: Medicare Other | Admitting: Hematology & Oncology

## 2022-12-05 ENCOUNTER — Inpatient Hospital Stay: Payer: Medicare Other | Attending: Hematology & Oncology

## 2022-12-05 ENCOUNTER — Encounter: Payer: Self-pay | Admitting: Hematology & Oncology

## 2022-12-05 VITALS — BP 155/54 | HR 56 | Temp 98.1°F | Resp 18 | Wt 191.1 lb

## 2022-12-05 DIAGNOSIS — Z7984 Long term (current) use of oral hypoglycemic drugs: Secondary | ICD-10-CM | POA: Insufficient documentation

## 2022-12-05 DIAGNOSIS — C50911 Malignant neoplasm of unspecified site of right female breast: Secondary | ICD-10-CM | POA: Insufficient documentation

## 2022-12-05 DIAGNOSIS — R197 Diarrhea, unspecified: Secondary | ICD-10-CM | POA: Diagnosis not present

## 2022-12-05 DIAGNOSIS — Z171 Estrogen receptor negative status [ER-]: Secondary | ICD-10-CM | POA: Diagnosis not present

## 2022-12-05 DIAGNOSIS — Z9011 Acquired absence of right breast and nipple: Secondary | ICD-10-CM | POA: Diagnosis not present

## 2022-12-05 DIAGNOSIS — Z7901 Long term (current) use of anticoagulants: Secondary | ICD-10-CM | POA: Diagnosis not present

## 2022-12-05 DIAGNOSIS — Z5112 Encounter for antineoplastic immunotherapy: Secondary | ICD-10-CM | POA: Insufficient documentation

## 2022-12-05 DIAGNOSIS — Z79899 Other long term (current) drug therapy: Secondary | ICD-10-CM | POA: Insufficient documentation

## 2022-12-05 LAB — CBC WITH DIFFERENTIAL (CANCER CENTER ONLY)
Abs Immature Granulocytes: 0.02 10*3/uL (ref 0.00–0.07)
Basophils Absolute: 0.1 10*3/uL (ref 0.0–0.1)
Basophils Relative: 1 %
Eosinophils Absolute: 0.3 10*3/uL (ref 0.0–0.5)
Eosinophils Relative: 3 %
HCT: 30.1 % — ABNORMAL LOW (ref 36.0–46.0)
Hemoglobin: 10.2 g/dL — ABNORMAL LOW (ref 12.0–15.0)
Immature Granulocytes: 0 %
Lymphocytes Relative: 23 %
Lymphs Abs: 2.2 10*3/uL (ref 0.7–4.0)
MCH: 30.4 pg (ref 26.0–34.0)
MCHC: 33.9 g/dL (ref 30.0–36.0)
MCV: 89.6 fL (ref 80.0–100.0)
Monocytes Absolute: 0.9 10*3/uL (ref 0.1–1.0)
Monocytes Relative: 10 %
Neutro Abs: 5.9 10*3/uL (ref 1.7–7.7)
Neutrophils Relative %: 63 %
Platelet Count: 286 10*3/uL (ref 150–400)
RBC: 3.36 MIL/uL — ABNORMAL LOW (ref 3.87–5.11)
RDW: 12.9 % (ref 11.5–15.5)
WBC Count: 9.3 10*3/uL (ref 4.0–10.5)
nRBC: 0 % (ref 0.0–0.2)

## 2022-12-05 LAB — IRON AND IRON BINDING CAPACITY (CC-WL,HP ONLY)
Iron: 45 ug/dL (ref 28–170)
Saturation Ratios: 14 % (ref 10.4–31.8)
TIBC: 333 ug/dL (ref 250–450)
UIBC: 288 ug/dL (ref 148–442)

## 2022-12-05 LAB — CMP (CANCER CENTER ONLY)
ALT: 13 U/L (ref 0–44)
AST: 21 U/L (ref 15–41)
Albumin: 3.8 g/dL (ref 3.5–5.0)
Alkaline Phosphatase: 55 U/L (ref 38–126)
Anion gap: 8 (ref 5–15)
BUN: 27 mg/dL — ABNORMAL HIGH (ref 8–23)
CO2: 29 mmol/L (ref 22–32)
Calcium: 9.4 mg/dL (ref 8.9–10.3)
Chloride: 100 mmol/L (ref 98–111)
Creatinine: 1.27 mg/dL — ABNORMAL HIGH (ref 0.44–1.00)
GFR, Estimated: 46 mL/min — ABNORMAL LOW (ref >60)
Glucose, Bld: 103 mg/dL — ABNORMAL HIGH (ref 70–99)
Potassium: 3.3 mmol/L — ABNORMAL LOW (ref 3.5–5.1)
Sodium: 137 mmol/L (ref 135–145)
Total Bilirubin: 0.3 mg/dL (ref 0.3–1.2)
Total Protein: 7.5 g/dL (ref 6.5–8.1)

## 2022-12-05 LAB — LACTATE DEHYDROGENASE: LDH: 197 U/L — ABNORMAL HIGH (ref 98–192)

## 2022-12-05 LAB — FERRITIN: Ferritin: 243 ng/mL (ref 11–307)

## 2022-12-05 MED ORDER — SODIUM CHLORIDE 0.9 % IV SOLN: Freq: Once | INTRAVENOUS | Status: AC

## 2022-12-05 MED ORDER — PROCHLORPERAZINE MALEATE 10 MG PO TABS
10.0000 mg | ORAL_TABLET | Freq: Once | ORAL | Status: AC
  Administered 2022-12-05: 10 mg via ORAL
  Filled 2022-12-05: qty 1

## 2022-12-05 MED ORDER — ACETAMINOPHEN 325 MG PO TABS
650.0000 mg | ORAL_TABLET | Freq: Once | ORAL | Status: AC
  Administered 2022-12-05: 650 mg via ORAL
  Filled 2022-12-05: qty 2

## 2022-12-05 MED ORDER — HEPARIN SOD (PORK) LOCK FLUSH 100 UNIT/ML IV SOLN
500.0000 [IU] | Freq: Once | INTRAVENOUS | Status: AC | PRN
  Administered 2022-12-05: 500 [IU]

## 2022-12-05 MED ORDER — PROCHLORPERAZINE EDISYLATE 10 MG/2ML IJ SOLN: 10.0000 mg | Freq: Once | INTRAMUSCULAR | Status: DC

## 2022-12-05 MED ORDER — SODIUM CHLORIDE 0.9 % IV SOLN
3.6000 mg/kg | Freq: Once | INTRAVENOUS | Status: AC
  Administered 2022-12-05: 320 mg via INTRAVENOUS
  Filled 2022-12-05: qty 16

## 2022-12-05 MED ORDER — DIPHENHYDRAMINE HCL 25 MG PO CAPS
50.0000 mg | ORAL_CAPSULE | Freq: Once | ORAL | Status: AC
  Administered 2022-12-05: 50 mg via ORAL
  Filled 2022-12-05: qty 2

## 2022-12-05 MED ORDER — SODIUM CHLORIDE 0.9% FLUSH
10.0000 mL | INTRAVENOUS | Status: DC | PRN
  Administered 2022-12-05: 10 mL

## 2022-12-05 NOTE — Patient Instructions (Signed)
[facility]  Discharge Instructions: Thank you for choosing [facility] to provide your oncology and hematology care.   If you have a lab appointment with the Cancer Center, please go directly to the Cancer Center and check in at the registration area.  Wear comfortable clothing and clothing appropriate for easy access to any Portacath or PICC line.   We strive to give you quality time with your provider. You may need to reschedule your appointment if you arrive late (15 or more minutes).  Arriving late affects you and other patients whose appointments are after yours.  Also, if you miss three or more appointments without notifying the office, you may be dismissed from the clinic at the provider's discretion.      For prescription refill requests, have your pharmacy contact our office and allow 72 hours for refills to be completed.    Today you received the following chemotherapy and/or immunotherapy agents Kadcyla      To help prevent nausea and vomiting after your treatment, we encourage you to take your nausea medication as directed.  BELOW ARE SYMPTOMS THAT SHOULD BE REPORTED IMMEDIATELY: *FEVER GREATER THAN 100.4 F (38 C) OR HIGHER *CHILLS OR SWEATING *NAUSEA AND VOMITING THAT IS NOT CONTROLLED WITH YOUR NAUSEA MEDICATION *UNUSUAL SHORTNESS OF BREATH *UNUSUAL BRUISING OR BLEEDING *URINARY PROBLEMS (pain or burning when urinating, or frequent urination) *BOWEL PROBLEMS (unusual diarrhea, constipation, pain near the anus) TENDERNESS IN MOUTH AND THROAT WITH OR WITHOUT PRESENCE OF ULCERS (sore throat, sores in mouth, or a toothache) UNUSUAL RASH, SWELLING OR PAIN  UNUSUAL VAGINAL DISCHARGE OR ITCHING   Items with * indicate a potential emergency and should be followed up as soon as possible or go to the Emergency Department if any problems should occur.  Please show the CHEMOTHERAPY ALERT CARD or IMMUNOTHERAPY ALERT CARD at check-in to the  Emergency Department and triage nurse. Should you have questions after your visit or need to cancel or reschedule your appointment, please contact [facility]  [phone] and follow the prompts.  Office hours are 8:00 a.m. to 4:30 p.m. Monday - Friday. Please note that voicemails left after 4:00 p.m. may not be returned until the following business day.  We are closed weekends and major holidays. You have access to a nurse at all times for urgent questions. Please call the main number to the clinic [phone] and follow the prompts.  For any non-urgent questions, you may also contact your provider using MyChart. We now offer e-Visits for anyone 18 and older to request care online for non-urgent symptoms. For details visit [email or web address].   Also download the MyChart app! Go to the app store, search "MyChart", open the app, select [facility], and log in with your MyChart username and password.  Masks are optional in the cancer centers. If you would like for your care team to wear a mask while they are taking care of you, please let them know. You may have one support person who is at least 68 years old accompany you for your appointments. 

## 2022-12-05 NOTE — Patient Instructions (Signed)

## 2022-12-05 NOTE — Progress Notes (Signed)
Hematology and Oncology Follow Up Visit  Brandi Roberts [id number] 21-Sep-1956 66 y.o. 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  So far, the Kadcyla is doing quite nicely.  She had a echocardiogram that was done back in November.  The ejection fraction was 60-65%.  She has had no problems with bleeding.  She has had no issues with nausea or vomiting.  There is no cough or shortness of breath.  She has had no rashes.  There is been no leg swelling.  She does have little bit of tenderness where she had her mastectomy.  I think that she outside of a little diarrhea, she really has had no other side effects from the Kadcyla.  She is taking oral iron.  She is doing well with this.  She has had no fever.  There is been no headache.  Overall, I would say performance status is probably ECOG 0.  Medications:  Current Outpatient Medications:    albuterol (VENTOLIN HFA) 108 (90 Base) MCG/ACT inhaler, Inhale 2 puffs into the lungs every 6 (six) hours as needed., Disp: , Rfl:    ALPRAZolam (XANAX) 0.5 MG tablet, Take 0.5 mg by mouth 2 (two) times daily as needed., Disp: , Rfl:    amLODipine (NORVASC) 10 MG tablet, Take 10 mg by mouth daily., Disp: , Rfl:    atorvastatin (LIPITOR) 10 MG tablet, Take 5 mg by mouth at bedtime., Disp: , Rfl:    benazepril (LOTENSIN) 40 MG tablet, Take 40 mg by mouth daily., Disp: , Rfl:    doxazosin (CARDURA) 2 MG tablet, Take 2 mg by mouth 2 (two) times daily., Disp: , Rfl:    ELIQUIS 5 MG TABS tablet, Take 5 mg by mouth 2 (two) times daily., Disp: , Rfl:    Ferrous Sulfate (IRON) 325 (65 Fe) MG TABS, Take 1 tablet by mouth daily., Disp: , Rfl:    gabapentin (NEURONTIN) 600 MG tablet, Take 600 mg by mouth at  bedtime., Disp: , Rfl:    hydrALAZINE (APRESOLINE) 50 MG tablet, Take 50 mg by mouth 2 (two) times daily., Disp: , Rfl:    HYDROcodone-acetaminophen (NORCO/VICODIN) 5-325 MG tablet, Take 2 tablets by mouth 2 (two) times daily., Disp: , Rfl:    metFORMIN (GLUCOPHAGE) 1000 MG tablet, Take 1,000 mg by mouth 2 (two) times daily., Disp: , Rfl:    metoprolol succinate (TOPROL-XL) 100 MG 24 hr tablet, Take 100 mg by mouth daily., Disp: , Rfl:    ondansetron (ZOFRAN-ODT) 4 MG disintegrating tablet, Take 1 tablet (4 mg total) by mouth every 8 (eight) hours as needed for nausea or vomiting., Disp: 20 tablet, Rfl: 0   sertraline (ZOLOFT) 25 MG tablet, Take 1 tablet (25 mg total) by mouth daily., Disp: 30 tablet, Rfl: 3   triamterene-hydrochlorothiazide (MAXZIDE) 75-50 MG per tablet, Take 1 tablet by mouth daily., Disp: , Rfl:    Vitamin D, Ergocalciferol, (DRISDOL) 1.25 MG (50000 UNIT) CAPS capsule, Take 50,000 Units by mouth once a week., Disp: , Rfl:  No current facility-administered medications for this visit.  Facility-Administered Medications Ordered in Other Visits:    diphenhydrAMINE (BENADRYL) 25 mg capsule, , , ,   Allergies:  Allergies  Allergen Reactions   Levaquin [Levofloxacin]  Nausea And Vomiting and Other (See Comments)    dizziness    Past Medical History, Surgical history, Social history, and Family History were reviewed and updated.  Review of Systems: Review of Systems  Constitutional: Negative.   HENT:  Negative.    Eyes: Negative.   Respiratory: Negative.    Cardiovascular: Negative.   Gastrointestinal: Negative.   Endocrine: Negative.   Genitourinary: Negative.    Musculoskeletal: Negative.   Skin: Negative.   Neurological: Negative.   Hematological: Negative.   Psychiatric/Behavioral: Negative.      Physical Exam:  weight is 191 lb 1.9 oz (86.7 kg). Her oral temperature is 98.1 F (36.7 C). Her blood pressure is 155/54 (abnormal) and her pulse is 56 (abnormal).  Her respiration is 18 and oxygen saturation is 100%.   Wt Readings from Last 3 Encounters:  12/05/22 191 lb 1.9 oz (86.7 kg)  11/14/22 192 lb (87.1 kg)  10/24/22 194 lb (88 kg)    Physical Exam Vitals reviewed.  Constitutional:      Comments: Chest wall exam shows right mastectomy.  This is healing very nicely.  She has no erythema or nodularity.  There is no right axillary adenopathy.  Left breast shows no masses, edema or erythema.  She has had no nodularity or inflammation or swelling.  There is no erythema.  There is no left axillary adenopathy.  HENT:     Head: Normocephalic and atraumatic.  Eyes:     Pupils: Pupils are equal, round, and reactive to light.  Cardiovascular:     Rate and Rhythm: Normal rate and regular rhythm.     Heart sounds: Normal heart sounds.  Pulmonary:     Effort: Pulmonary effort is normal.     Breath sounds: Normal breath sounds.  Abdominal:     General: Bowel sounds are normal.     Palpations: Abdomen is soft.  Musculoskeletal:        General: No tenderness or deformity. Normal range of motion.     Cervical back: Normal range of motion.  Lymphadenopathy:     Cervical: No cervical adenopathy.  Skin:    General: Skin is warm and dry.     Findings: No erythema or rash.  Neurological:     Mental Status: She is alert and oriented to person, place, and time.  Psychiatric:        Behavior: Behavior normal.        Thought Content: Thought content normal.        Judgment: Judgment normal.      Lab Results  Component Value Date   WBC 9.3 12/05/2022   HGB 10.2 (L) 12/05/2022   HCT 30.1 (L) 12/05/2022   MCV 89.6 12/05/2022   PLT 286 12/05/2022     Chemistry      Component Value Date/Time   NA 136 11/14/2022 1005   K 3.5 11/14/2022 1005   CL 99 11/14/2022 1005   CO2 29 11/14/2022 1005   BUN 19 11/14/2022 1005   CREATININE 1.14 (H) 11/14/2022 1005      Component Value Date/Time   CALCIUM 9.8 11/14/2022 1005   ALKPHOS 59 11/14/2022 1005    AST 25 11/14/2022 1005   ALT 19 11/14/2022 1005   BILITOT 0.3 11/14/2022 1005      Impression and Plan: Brandi Roberts is a very charming 66 year old postmenopausal Afro-American female.  She has a invasive ductal carcinoma of the right breast.  She underwent a mastectomy.  She had 1 lymph node  positive.  The tumor is ER -/PR-.  Unfortunately, her breast cancer is HER2 positive.  We have her on adjuvant therapy with Kadcyla.  She did not wish to have any chemotherapy.  However, she would take Kadcyla.  We will go ahead with Kadcyla.  This will be her 8th cycle of Kadcyla.  We will keep her on Kadcyla for 1 year.  I think we need 1 year of Kadcyla because of the positive lymph node.  We will plan to get her back in another 4 weeks.  I will give her extra week off for her [birth year] which is [birth year].    Volanda Napoleon, MD 12/11/20238:43 AM

## 2022-12-06 ENCOUNTER — Other Ambulatory Visit: Payer: Self-pay

## 2022-12-07 ENCOUNTER — Encounter: Payer: Self-pay | Admitting: *Deleted

## 2022-12-07 ENCOUNTER — Other Ambulatory Visit: Payer: Self-pay

## 2022-12-07 NOTE — Progress Notes (Signed)
Patient continues on Kadcyla. Next treatment delayed a week due to her birthday.   Oncology Nurse Navigator Documentation     12/07/2022    8:15 AM  Oncology Nurse Navigator Flowsheets  Navigator Follow Up Date: 01/02/2023  Navigator Follow Up Reason: Follow-up Appointment;Chemotherapy  Navigator Location [facility]  Navigator Encounter Type Appt/Treatment Plan Review  Patient Visit Type MedOnc  Treatment Phase Active Tx  Barriers/Navigation Needs No Barriers At This Time  Interventions None Required  Acuity Level 1-No Barriers  Support Groups/Services Friends and Family  Time Spent with Patient 15

## 2022-12-08 ENCOUNTER — Other Ambulatory Visit: Payer: Self-pay

## 2022-12-15 ENCOUNTER — Other Ambulatory Visit: Payer: Self-pay

## 2022-12-16 ENCOUNTER — Other Ambulatory Visit: Payer: Self-pay

## 2022-12-27 ENCOUNTER — Inpatient Hospital Stay: Payer: Medicare Other

## 2022-12-27 ENCOUNTER — Inpatient Hospital Stay: Payer: Medicare Other | Admitting: Hematology & Oncology

## 2023-01-02 ENCOUNTER — Inpatient Hospital Stay: Payer: Medicare Other | Attending: Hematology & Oncology

## 2023-01-02 ENCOUNTER — Inpatient Hospital Stay: Payer: Medicare Other

## 2023-01-02 ENCOUNTER — Inpatient Hospital Stay: Payer: Medicare Other | Admitting: Hematology & Oncology

## 2023-01-02 ENCOUNTER — Ambulatory Visit: Payer: Medicare Other

## 2023-01-02 ENCOUNTER — Other Ambulatory Visit: Payer: Self-pay

## 2023-01-02 ENCOUNTER — Encounter: Payer: Self-pay | Admitting: Hematology & Oncology

## 2023-01-02 VITALS — BP 186/60 | HR 57 | Temp 97.4°F | Resp 18 | Ht 66.0 in | Wt 184.0 lb

## 2023-01-02 VITALS — BP 137/49 | HR 51 | Temp 97.6°F | Resp 18

## 2023-01-02 DIAGNOSIS — C50911 Malignant neoplasm of unspecified site of right female breast: Secondary | ICD-10-CM

## 2023-01-02 DIAGNOSIS — Z171 Estrogen receptor negative status [ER-]: Secondary | ICD-10-CM | POA: Insufficient documentation

## 2023-01-02 DIAGNOSIS — Z9011 Acquired absence of right breast and nipple: Secondary | ICD-10-CM | POA: Insufficient documentation

## 2023-01-02 DIAGNOSIS — R0789 Other chest pain: Secondary | ICD-10-CM | POA: Diagnosis not present

## 2023-01-02 DIAGNOSIS — Z5111 Encounter for antineoplastic chemotherapy: Secondary | ICD-10-CM | POA: Diagnosis present

## 2023-01-02 LAB — CBC WITH DIFFERENTIAL (CANCER CENTER ONLY)
Abs Immature Granulocytes: 0.02 10*3/uL (ref 0.00–0.07)
Basophils Absolute: 0.1 10*3/uL (ref 0.0–0.1)
Basophils Relative: 1 %
Eosinophils Absolute: 0.1 10*3/uL (ref 0.0–0.5)
Eosinophils Relative: 2 %
HCT: 31.7 % — ABNORMAL LOW (ref 36.0–46.0)
Hemoglobin: 10.7 g/dL — ABNORMAL LOW (ref 12.0–15.0)
Immature Granulocytes: 0 %
Lymphocytes Relative: 27 %
Lymphs Abs: 2.4 10*3/uL (ref 0.7–4.0)
MCH: 29.9 pg (ref 26.0–34.0)
MCHC: 33.8 g/dL (ref 30.0–36.0)
MCV: 88.5 fL (ref 80.0–100.0)
Monocytes Absolute: 0.8 10*3/uL (ref 0.1–1.0)
Monocytes Relative: 9 %
Neutro Abs: 5.4 10*3/uL (ref 1.7–7.7)
Neutrophils Relative %: 61 %
Platelet Count: 268 10*3/uL (ref 150–400)
RBC: 3.58 MIL/uL — ABNORMAL LOW (ref 3.87–5.11)
RDW: 12.6 % (ref 11.5–15.5)
WBC Count: 8.7 10*3/uL (ref 4.0–10.5)
nRBC: 0 % (ref 0.0–0.2)

## 2023-01-02 LAB — CMP (CANCER CENTER ONLY)
ALT: 15 U/L (ref 0–44)
AST: 24 U/L (ref 15–41)
Albumin: 4 g/dL (ref 3.5–5.0)
Alkaline Phosphatase: 52 U/L (ref 38–126)
Anion gap: 9 (ref 5–15)
BUN: 21 mg/dL (ref 8–23)
CO2: 28 mmol/L (ref 22–32)
Calcium: 9.5 mg/dL (ref 8.9–10.3)
Chloride: 97 mmol/L — ABNORMAL LOW (ref 98–111)
Creatinine: 1.26 mg/dL — ABNORMAL HIGH (ref 0.44–1.00)
GFR, Estimated: 46 mL/min — ABNORMAL LOW (ref >60)
Glucose, Bld: 104 mg/dL — ABNORMAL HIGH (ref 70–99)
Potassium: 3.7 mmol/L (ref 3.5–5.1)
Sodium: 134 mmol/L — ABNORMAL LOW (ref 135–145)
Total Bilirubin: 0.4 mg/dL (ref 0.3–1.2)
Total Protein: 7.7 g/dL (ref 6.5–8.1)

## 2023-01-02 LAB — IRON AND IRON BINDING CAPACITY (CC-WL,HP ONLY)
Iron: 105 ug/dL (ref 28–170)
Saturation Ratios: 30 % (ref 10.4–31.8)
TIBC: 354 ug/dL (ref 250–450)
UIBC: 249 ug/dL (ref 148–442)

## 2023-01-02 LAB — LACTATE DEHYDROGENASE: LDH: 205 U/L — ABNORMAL HIGH (ref 98–192)

## 2023-01-02 LAB — FERRITIN: Ferritin: 258 ng/mL (ref 11–307)

## 2023-01-02 MED ORDER — ACETAMINOPHEN 325 MG PO TABS
650.0000 mg | ORAL_TABLET | Freq: Once | ORAL | Status: AC
  Administered 2023-01-02: 650 mg via ORAL
  Filled 2023-01-02: qty 2

## 2023-01-02 MED ORDER — DIPHENHYDRAMINE HCL 25 MG PO CAPS
50.0000 mg | ORAL_CAPSULE | Freq: Once | ORAL | Status: AC
  Administered 2023-01-02: 50 mg via ORAL
  Filled 2023-01-02: qty 2

## 2023-01-02 MED ORDER — SODIUM CHLORIDE 0.9 % IV SOLN
3.6000 mg/kg | Freq: Once | INTRAVENOUS | Status: AC
  Administered 2023-01-02: 320 mg via INTRAVENOUS
  Filled 2023-01-02: qty 16

## 2023-01-02 MED ORDER — PROCHLORPERAZINE MALEATE 10 MG PO TABS
10.0000 mg | ORAL_TABLET | Freq: Once | ORAL | Status: AC
  Administered 2023-01-02: 10 mg via ORAL
  Filled 2023-01-02: qty 1

## 2023-01-02 MED ORDER — SODIUM CHLORIDE 0.9 % IV SOLN: Freq: Once | INTRAVENOUS | Status: AC

## 2023-01-02 MED ORDER — SODIUM CHLORIDE 0.9% FLUSH
10.0000 mL | INTRAVENOUS | Status: DC | PRN
  Administered 2023-01-02: 10 mL

## 2023-01-02 MED ORDER — PROCHLORPERAZINE EDISYLATE 10 MG/2ML IJ SOLN: 10.0000 mg | Freq: Once | INTRAMUSCULAR | Status: DC

## 2023-01-02 MED ORDER — HEPARIN SOD (PORK) LOCK FLUSH 100 UNIT/ML IV SOLN
500.0000 [IU] | Freq: Once | INTRAVENOUS | Status: AC | PRN
  Administered 2023-01-02: 500 [IU]

## 2023-01-02 NOTE — Progress Notes (Signed)
Hematology and Oncology Follow Up Visit  Brandi Roberts [id number] 03-06-1956 67 y.o. 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  She did have a very nice birthday last week.  Her family to her and they had some [place] food.  She enjoy the holiday season.  She is feeling well.  She has had no complaints.  She has had no issues with the Kadcyla.  She has had no problems with nausea or vomiting.  She has had no change in bowel or bladder habits.  There is been no cough or shortness of breath.  She has had no issues with COVID or Influenza.  There is been no headache.  She has had no problems with leg swelling.  She has had no bleeding.  There is been no rashes.  Overall, I would say that her performance status is ECOG 1.     Medications:  Current Outpatient Medications:    albuterol (VENTOLIN HFA) 108 (90 Base) MCG/ACT inhaler, Inhale 2 puffs into the lungs every 6 (six) hours as needed., Disp: , Rfl:    ALPRAZolam (XANAX) 0.5 MG tablet, Take 0.5 mg by mouth 2 (two) times daily as needed., Disp: , Rfl:    amLODipine (NORVASC) 10 MG tablet, Take 10 mg by mouth daily., Disp: , Rfl:    atorvastatin (LIPITOR) 10 MG tablet, Take 5 mg by mouth at bedtime., Disp: , Rfl:    benazepril (LOTENSIN) 40 MG tablet, Take 40 mg by mouth daily., Disp: , Rfl:    doxazosin (CARDURA) 2 MG tablet, Take 2 mg by mouth 2 (two) times daily., Disp: , Rfl:    ELIQUIS 5 MG TABS tablet, Take 5 mg by mouth 2 (two) times daily., Disp: , Rfl:    Ferrous Sulfate (IRON) 325 (65 Fe) MG TABS, Take 1 tablet by mouth daily., Disp: , Rfl:    gabapentin (NEURONTIN) 600 MG tablet, Take 600 mg by mouth at bedtime., Disp: , Rfl:    hydrALAZINE (APRESOLINE) 50 MG tablet,  Take 50 mg by mouth 2 (two) times daily., Disp: , Rfl:    HYDROcodone-acetaminophen (NORCO/VICODIN) 5-325 MG tablet, Take 2 tablets by mouth 2 (two) times daily., Disp: , Rfl:    metFORMIN (GLUCOPHAGE) 1000 MG tablet, Take 1,000 mg by mouth 2 (two) times daily., Disp: , Rfl:    metoprolol succinate (TOPROL-XL) 100 MG 24 hr tablet, Take 100 mg by mouth daily., Disp: , Rfl:    ondansetron (ZOFRAN-ODT) 4 MG disintegrating tablet, Take 1 tablet (4 mg total) by mouth every 8 (eight) hours as needed for nausea or vomiting., Disp: 20 tablet, Rfl: 0   sertraline (ZOLOFT) 25 MG tablet, Take 1 tablet (25 mg total) by mouth daily., Disp: 30 tablet, Rfl: 3   triamterene-hydrochlorothiazide (MAXZIDE) 75-50 MG per tablet, Take 1 tablet by mouth daily., Disp: , Rfl:    Vitamin D, Ergocalciferol, (DRISDOL) 1.25 MG (50000 UNIT) CAPS capsule, Take 50,000 Units by mouth once a week., Disp: , Rfl:  No current facility-administered medications for this visit.  Facility-Administered Medications Ordered in Other Visits:    diphenhydrAMINE (BENADRYL) 25 mg capsule, , , ,   Allergies:  Allergies  Allergen Reactions   Levaquin [Levofloxacin] Nausea And Vomiting and Other (See  Comments)    dizziness    Past Medical History, Surgical history, Social history, and Family History were reviewed and updated.  Review of Systems: Review of Systems  Constitutional: Negative.   HENT:  Negative.    Eyes: Negative.   Respiratory: Negative.    Cardiovascular: Negative.   Gastrointestinal: Negative.   Endocrine: Negative.   Genitourinary: Negative.    Musculoskeletal: Negative.   Skin: Negative.   Neurological: Negative.   Hematological: Negative.   Psychiatric/Behavioral: Negative.      Physical Exam:  height is '5\' 6"'$  (1.676 m) and weight is 184 lb (83.5 kg). Her oral temperature is 97.4 F (36.3 C) (abnormal). Her blood pressure is 186/60 (abnormal) and her pulse is 57 (abnormal). Her respiration is 18 and oxygen  saturation is 100%.   Wt Readings from Last 3 Encounters:  01/02/23 184 lb (83.5 kg)  12/05/22 191 lb 1.9 oz (86.7 kg)  11/14/22 192 lb (87.1 kg)    Physical Exam Vitals reviewed.  Constitutional:      Comments: Chest wall exam shows right mastectomy.  This is healing very nicely.  She has no erythema or nodularity.  There is no right axillary adenopathy.  Left breast shows no masses, edema or erythema.  She has had no nodularity or inflammation or swelling.  There is no erythema.  There is no left axillary adenopathy.  HENT:     Head: Normocephalic and atraumatic.  Eyes:     Pupils: Pupils are equal, round, and reactive to light.  Cardiovascular:     Rate and Rhythm: Normal rate and regular rhythm.     Heart sounds: Normal heart sounds.  Pulmonary:     Effort: Pulmonary effort is normal.     Breath sounds: Normal breath sounds.  Abdominal:     General: Bowel sounds are normal.     Palpations: Abdomen is soft.  Musculoskeletal:        General: No tenderness or deformity. Normal range of motion.     Cervical back: Normal range of motion.  Lymphadenopathy:     Cervical: No cervical adenopathy.  Skin:    General: Skin is warm and dry.     Findings: No erythema or rash.  Neurological:     Mental Status: She is alert and oriented to person, place, and time.  Psychiatric:        Behavior: Behavior normal.        Thought Content: Thought content normal.        Judgment: Judgment normal.     Lab Results  Component Value Date   WBC 8.7 01/02/2023   HGB 10.7 (L) 01/02/2023   HCT 31.7 (L) 01/02/2023   MCV 88.5 01/02/2023   PLT 268 01/02/2023     Chemistry      Component Value Date/Time   NA 134 (L) 01/02/2023 1020   K 3.7 01/02/2023 1020   CL 97 (L) 01/02/2023 1020   CO2 28 01/02/2023 1020   BUN 21 01/02/2023 1020   CREATININE 1.26 (H) 01/02/2023 1020      Component Value Date/Time   CALCIUM 9.5 01/02/2023 1020   ALKPHOS 52 01/02/2023 1020   AST 24 01/02/2023 1020    ALT 15 01/02/2023 1020   BILITOT 0.4 01/02/2023 1020      Impression and Plan: Brandi Roberts is a very charming 67 year old postmenopausal Afro-American female.  She has a invasive ductal carcinoma of the right breast.  She underwent a mastectomy.  She had 1 lymph  node positive.  The tumor is ER -/PR-.  Unfortunately, her breast cancer is HER2 positive.  We have her on adjuvant therapy with Kadcyla.  She did not wish to have any chemotherapy.  However, she would take Kadcyla.  We will go ahead with Kadcyla.  This will be her 9th cycle of Kadcyla.  We will keep her on Kadcyla for 1 year.  I think we need 1 year of Kadcyla because of the positive lymph node.  We will go ahead and plan to get her back in 3 weeks.  We did give her next week off this time because of her birthday.    Volanda Napoleon, MD 1/8/202411:27 AM

## 2023-01-02 NOTE — Patient Instructions (Signed)
Ado-Trastuzumab Emtansine Injection What is this medication? ADO-TRASTUZUMAB EMTANSINE (ADD oh traz TOO zuh mab em TAN zine) treats breast cancer. It works by blocking a protein that causes cancer cells to grow and multiply. This helps to slow or stop the spread of cancer cells. This medicine may be used for other purposes; ask your health care provider or pharmacist if you have questions. COMMON BRAND NAME(S): Kadcyla What should I tell my care team before I take this medication? They need to know if you have any of these conditions: Heart failure Liver disease Low platelet levels Lung disease Tingling of the fingers or toes or other nerve disorder An unusual or allergic reaction to ado-trastuzumab emtansine, other medications, foods, dyes, or preservatives Pregnant or trying to get pregnant Breast-feeding How should I use this medication? This medication is infused into a vein. It is given by your care team in a hospital or clinic setting. Talk to your care team about the use of this medication in children. Special care may be needed. Overdosage: If you think you have taken too much of this medicine contact a poison control center or emergency room at once. NOTE: This medicine is only for you. Do not share this medicine with others. What if I miss a dose? Keep appointments for follow-up doses. It is important not to miss your dose. Call your care team if you are unable to keep an appointment. What may interact with this medication? Atazanavir Boceprevir Clarithromycin Dalfopristin; quinupristin Delavirdine Indinavir Isoniazid, INH Itraconazole Ketoconazole Nefazodone Nelfinavir Ritonavir Telaprevir Telithromycin Tipranavir Voriconazole This list may not describe all possible interactions. Give your health care provider a list of all the medicines, herbs, non-prescription drugs, or dietary supplements you use. Also tell them if you smoke, drink alcohol, or use illegal drugs.  Some items may interact with your medicine. What should I watch for while using this medication? This medication may make you feel generally unwell. This is not uncommon, as chemotherapy can affect healthy cells as well as cancer cells. Report any side effects. Continue your course of treatment even though you feel ill unless your care team tells you to stop. You may need blood work while taking this medication. This medication may increase your risk to bruise or bleed. Call your care team if you notice any unusual bleeding. Be careful brushing or flossing your teeth or using a toothpick because you may get an infection or bleed more easily. If you have any dental work done, tell your dentist you are receiving this medication. Talk to your care team if you may be pregnant. Serious birth defects can occur if you take this medication during pregnancy and for 7 months after the last dose. You will need a negative pregnancy test before starting this medication. Contraception is recommended while taking this medication and for 7 months after the last dose. Your care team can help you find the option that works for you. If your partner can get pregnant, use a condom during sex while taking this medication and for 4 months after the last dose. Do not breastfeed while taking this medication and for 7 months after the last dose. This medication may cause infertility. Talk to your care team if you are concerned with your fertility. What side effects may I notice from receiving this medication? Side effects that you should report to your care team as soon as possible: Allergic reactions--skin rash, itching, hives, swelling of the face, lips, tongue, or throat Bleeding--bloody or black, tar-like stools, vomiting   blood or brown material that looks like coffee grounds, red or dark brown urine, small red or purple spots on skin, unusual bruising or bleeding Dry cough, shortness of breath or trouble breathing Heart  failure--shortness of breath, swelling of the ankles, feet, or hands, sudden weight gain, unusual weakness or fatigue Infusion reactions--chest pain, shortness of breath or trouble breathing, feeling faint or lightheaded Liver injury--right upper belly pain, loss of appetite, nausea, light-colored stool, dark yellow or brown urine, yellowing skin or eyes, unusual weakness or fatigue Pain, tingling, or numbness in the hands or feet Painful swelling, warmth, or redness of the skin, blisters or sores at the infusion site Side effects that usually do not require medical attention (report to your care team if they continue or are bothersome): Constipation Fatigue Headache Muscle pain Nausea This list may not describe all possible side effects. Call your doctor for medical advice about side effects. You may report side effects to FDA at 1-800-FDA-1088. Where should I keep my medication? This medication is given in a hospital or clinic. It will not be stored at home. NOTE: This sheet is a summary. It may not cover all possible information. If you have questions about this medicine, talk to your doctor, pharmacist, or health care provider.  2023 Elsevier/Gold Standard (2022-04-29 00:00:00)  

## 2023-01-02 NOTE — Patient Instructions (Signed)

## 2023-01-03 ENCOUNTER — Encounter: Payer: Self-pay | Admitting: *Deleted

## 2023-01-03 ENCOUNTER — Other Ambulatory Visit: Payer: Self-pay

## 2023-01-03 NOTE — Progress Notes (Signed)
Patient received treatment. She will continue on her planned one year of Kadcyla.   Oncology Nurse Navigator Documentation     01/03/2023    8:00 AM  Oncology Nurse Navigator Flowsheets  Navigator Follow Up Date: 01/23/2023  Navigator Follow Up Reason: Follow-up Appointment;Chemotherapy  Navigator Location [facility]  Navigator Encounter Type Appt/Treatment Plan Review  Patient Visit Type MedOnc  Treatment Phase Active Tx  Barriers/Navigation Needs No Barriers At This Time  Interventions None Required  Acuity Level 1-No Barriers  Support Groups/Services Friends and Family  Time Spent with Patient 15

## 2023-01-23 ENCOUNTER — Inpatient Hospital Stay: Payer: Medicare Other

## 2023-01-23 ENCOUNTER — Ambulatory Visit: Payer: Medicare Other

## 2023-01-23 ENCOUNTER — Inpatient Hospital Stay (HOSPITAL_BASED_OUTPATIENT_CLINIC_OR_DEPARTMENT_OTHER): Payer: Medicare Other | Admitting: Hematology & Oncology

## 2023-01-23 ENCOUNTER — Other Ambulatory Visit: Payer: Self-pay

## 2023-01-23 ENCOUNTER — Encounter: Payer: Self-pay | Admitting: Hematology & Oncology

## 2023-01-23 ENCOUNTER — Encounter: Payer: Self-pay | Admitting: *Deleted

## 2023-01-23 VITALS — BP 168/66 | HR 64 | Temp 97.8°F | Resp 20 | Ht 66.0 in | Wt 189.0 lb

## 2023-01-23 DIAGNOSIS — C50911 Malignant neoplasm of unspecified site of right female breast: Secondary | ICD-10-CM

## 2023-01-23 DIAGNOSIS — Z171 Estrogen receptor negative status [ER-]: Secondary | ICD-10-CM

## 2023-01-23 DIAGNOSIS — Z5111 Encounter for antineoplastic chemotherapy: Secondary | ICD-10-CM | POA: Diagnosis not present

## 2023-01-23 LAB — CMP (CANCER CENTER ONLY)
ALT: 16 U/L (ref 0–44)
AST: 23 U/L (ref 15–41)
Albumin: 3.9 g/dL (ref 3.5–5.0)
Alkaline Phosphatase: 54 U/L (ref 38–126)
Anion gap: 10 (ref 5–15)
BUN: 25 mg/dL — ABNORMAL HIGH (ref 8–23)
CO2: 28 mmol/L (ref 22–32)
Calcium: 10 mg/dL (ref 8.9–10.3)
Chloride: 98 mmol/L (ref 98–111)
Creatinine: 1.4 mg/dL — ABNORMAL HIGH (ref 0.44–1.00)
GFR, Estimated: 41 mL/min — ABNORMAL LOW (ref >60)
Glucose, Bld: 111 mg/dL — ABNORMAL HIGH (ref 70–99)
Potassium: 3.8 mmol/L (ref 3.5–5.1)
Sodium: 136 mmol/L (ref 135–145)
Total Bilirubin: 0.3 mg/dL (ref 0.3–1.2)
Total Protein: 7.5 g/dL (ref 6.5–8.1)

## 2023-01-23 LAB — CBC WITH DIFFERENTIAL (CANCER CENTER ONLY)
Abs Immature Granulocytes: 0.17 10*3/uL — ABNORMAL HIGH (ref 0.00–0.07)
Basophils Absolute: 0.1 10*3/uL (ref 0.0–0.1)
Basophils Relative: 1 %
Eosinophils Absolute: 0.1 10*3/uL (ref 0.0–0.5)
Eosinophils Relative: 1 %
HCT: 32.8 % — ABNORMAL LOW (ref 36.0–46.0)
Hemoglobin: 10.8 g/dL — ABNORMAL LOW (ref 12.0–15.0)
Immature Granulocytes: 2 %
Lymphocytes Relative: 22 %
Lymphs Abs: 2.3 10*3/uL (ref 0.7–4.0)
MCH: 29.6 pg (ref 26.0–34.0)
MCHC: 32.9 g/dL (ref 30.0–36.0)
MCV: 89.9 fL (ref 80.0–100.0)
Monocytes Absolute: 0.9 10*3/uL (ref 0.1–1.0)
Monocytes Relative: 8 %
Neutro Abs: 6.9 10*3/uL (ref 1.7–7.7)
Neutrophils Relative %: 66 %
Platelet Count: 267 10*3/uL (ref 150–400)
RBC: 3.65 MIL/uL — ABNORMAL LOW (ref 3.87–5.11)
RDW: 12.9 % (ref 11.5–15.5)
WBC Count: 10.5 10*3/uL (ref 4.0–10.5)
nRBC: 0 % (ref 0.0–0.2)

## 2023-01-23 LAB — LACTATE DEHYDROGENASE: LDH: 224 U/L — ABNORMAL HIGH (ref 98–192)

## 2023-01-23 MED ORDER — HEPARIN SOD (PORK) LOCK FLUSH 100 UNIT/ML IV SOLN
500.0000 [IU] | Freq: Once | INTRAVENOUS | Status: AC | PRN
  Administered 2023-01-23: 500 [IU]

## 2023-01-23 MED ORDER — SODIUM CHLORIDE 0.9 % IV SOLN
3.6000 mg/kg | Freq: Once | INTRAVENOUS | Status: AC
  Administered 2023-01-23: 320 mg via INTRAVENOUS
  Filled 2023-01-23: qty 16

## 2023-01-23 MED ORDER — SODIUM CHLORIDE 0.9% FLUSH
10.0000 mL | INTRAVENOUS | Status: DC | PRN
  Administered 2023-01-23: 10 mL

## 2023-01-23 MED ORDER — SODIUM CHLORIDE 0.9 % IV SOLN: Freq: Once | INTRAVENOUS | Status: AC

## 2023-01-23 MED ORDER — DIPHENHYDRAMINE HCL 25 MG PO CAPS
50.0000 mg | ORAL_CAPSULE | Freq: Once | ORAL | Status: AC
  Administered 2023-01-23: 50 mg via ORAL
  Filled 2023-01-23: qty 2

## 2023-01-23 MED ORDER — ACETAMINOPHEN 325 MG PO TABS
650.0000 mg | ORAL_TABLET | Freq: Once | ORAL | Status: AC
  Administered 2023-01-23: 650 mg via ORAL
  Filled 2023-01-23: qty 2

## 2023-01-23 MED ORDER — PROCHLORPERAZINE EDISYLATE 10 MG/2ML IJ SOLN
10.0000 mg | Freq: Once | INTRAMUSCULAR | Status: AC
  Administered 2023-01-23: 10 mg via INTRAVENOUS

## 2023-01-23 NOTE — Patient Instructions (Signed)
[facility]  Discharge Instructions: Thank you for choosing [facility] to provide your oncology and hematology care.   If you have a lab appointment with the [facility], please go directly to the [facility] and check in at the registration area.  Wear comfortable clothing and clothing appropriate for easy access to any Portacath or PICC line.   We strive to give you quality time with your provider. You may need to reschedule your appointment if you arrive late (15 or more minutes).  Arriving late affects you and other patients whose appointments are after yours.  Also, if you miss three or more appointments without notifying the office, you may be dismissed from the clinic at the provider's discretion.      For prescription refill requests, have your pharmacy contact our office and allow 72 hours for refills to be completed.    Today you received the following chemotherapy and/or immunotherapy agents Kadcyla      To help prevent nausea and vomiting after your treatment, we encourage you to take your nausea medication as directed.  BELOW ARE SYMPTOMS THAT SHOULD BE REPORTED IMMEDIATELY: *FEVER GREATER THAN 100.4 F (38 C) OR HIGHER *CHILLS OR SWEATING *NAUSEA AND VOMITING THAT IS NOT CONTROLLED WITH YOUR NAUSEA MEDICATION *UNUSUAL SHORTNESS OF BREATH *UNUSUAL BRUISING OR BLEEDING *URINARY PROBLEMS (pain or burning when urinating, or frequent urination) *BOWEL PROBLEMS (unusual diarrhea, constipation, pain near the anus) TENDERNESS IN MOUTH AND THROAT WITH OR WITHOUT PRESENCE OF ULCERS (sore throat, sores in mouth, or a toothache) UNUSUAL RASH, SWELLING OR PAIN  UNUSUAL VAGINAL DISCHARGE OR ITCHING   Items with * indicate a potential emergency and should be followed up as soon as possible or go to the Emergency Department if any problems should occur.  Please show the CHEMOTHERAPY ALERT CARD or IMMUNOTHERAPY ALERT CARD at check-in  to the Emergency Department and triage nurse. Should you have questions after your visit or need to cancel or reschedule your appointment, please contact [facility]  [phone] and follow the prompts.  Office hours are 8:00 a.m. to 4:30 p.m. Monday - Friday. Please note that voicemails left after 4:00 p.m. may not be returned until the following business day.  We are closed weekends and major holidays. You have access to a nurse at all times for urgent questions. Please call the main number to the clinic [phone] and follow the prompts.  For any non-urgent questions, you may also contact your provider using MyChart. We now offer e-Visits for anyone 67 and older to request care online for non-urgent symptoms. For details visit [email or web address].   Also download the MyChart app! Go to the app store, search "MyChart", open the app, select [facility], and log in with your MyChart username and password.

## 2023-01-23 NOTE — Patient Instructions (Signed)

## 2023-01-23 NOTE — Progress Notes (Signed)
Patient received cycle 7 of a planned 10 treatments.   Oncology Nurse Navigator Documentation     01/23/2023   11:15 AM  Oncology Nurse Navigator Flowsheets  Navigator Follow Up Date: 02/13/2023  Navigator Follow Up Reason: Follow-up Appointment;Chemotherapy  Navigator Location [facility]  Navigator Encounter Type Appt/Treatment Plan Review  Patient Visit Type MedOnc  Treatment Phase Active Tx  Barriers/Navigation Needs No Barriers At This Time  Interventions None Required  Acuity Level 1-No Barriers  Support Groups/Services Friends and Family  Time Spent with Patient 15

## 2023-01-23 NOTE — Progress Notes (Signed)
Hematology and Oncology Follow Up Visit  Brandi Roberts [id number] Dec 11, 1956 67 y.o. 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  Her only complaint has been some tightness on the right anterior chest wall and right shoulder.  She had a mastectomy.  I suspect that she probably has scar tissue.  She probably needs little bit of physical therapy.  She lives up in [place], [place].  She knows that there is a good physical therapy place up there.  She will give [place] the name of this and then we will make a referral.  Otherwise, she is doing fairly well.  She does not have any problems with bowels or bladder.  She is having no cough or shortness of breath.  She is having no issues with rashes.  There is been no leg swelling.  She has had no bleeding.  Her last iron studies that were done back in early January showed a ferritin of 258 with an iron saturation of 30%.  Overall, I would have to say that her performance status is ECOG 1.    Medications:  Current Outpatient Medications:    albuterol (VENTOLIN HFA) 108 (90 Base) MCG/ACT inhaler, Inhale 2 puffs into the lungs every 6 (six) hours as needed., Disp: , Rfl:    ALPRAZolam (XANAX) 0.5 MG tablet, Take 0.5 mg by mouth 2 (two) times daily as needed., Disp: , Rfl:    amLODipine (NORVASC) 10 MG tablet, Take 10 mg by mouth daily., Disp: , Rfl:    atorvastatin (LIPITOR) 10 MG tablet, Take 5 mg by mouth at bedtime., Disp: , Rfl:    benazepril (LOTENSIN) 40 MG tablet, Take 40 mg by mouth daily., Disp: , Rfl:    doxazosin (CARDURA) 2 MG tablet, Take 2 mg by mouth 2 (two) times daily., Disp: , Rfl:    ELIQUIS 5 MG TABS tablet, Take 5 mg by mouth 2 (two) times daily., Disp: , Rfl:    Ferrous Sulfate  (IRON) 325 (65 Fe) MG TABS, Take 1 tablet by mouth daily., Disp: , Rfl:    gabapentin (NEURONTIN) 600 MG tablet, Take 600 mg by mouth at bedtime., Disp: , Rfl:    hydrALAZINE (APRESOLINE) 50 MG tablet, Take 50 mg by mouth 2 (two) times daily., Disp: , Rfl:    HYDROcodone-acetaminophen (NORCO/VICODIN) 5-325 MG tablet, Take 2 tablets by mouth 2 (two) times daily., Disp: , Rfl:    metFORMIN (GLUCOPHAGE) 1000 MG tablet, Take 1,000 mg by mouth 2 (two) times daily., Disp: , Rfl:    metoprolol succinate (TOPROL-XL) 100 MG 24 hr tablet, Take 100 mg by mouth daily., Disp: , Rfl:    ondansetron (ZOFRAN-ODT) 4 MG disintegrating tablet, Take 1 tablet (4 mg total) by mouth every 8 (eight) hours as needed for nausea or vomiting., Disp: 20 tablet, Rfl: 0   sertraline (ZOLOFT) 25 MG tablet, Take 1 tablet (25 mg total) by mouth daily., Disp: 30 tablet, Rfl: 3   triamterene-hydrochlorothiazide (MAXZIDE) 75-50 MG per tablet, Take 1 tablet by mouth daily., Disp: , Rfl:    Vitamin D, Ergocalciferol, (DRISDOL) 1.25 MG (50000 UNIT) CAPS capsule, Take 50,000 Units by mouth once a week., Disp: , Rfl:  No current facility-administered medications for this visit.  Facility-Administered  Medications Ordered in Other Visits:    diphenhydrAMINE (BENADRYL) 25 mg capsule, , , ,   Allergies:  Allergies  Allergen Reactions   Levaquin [Levofloxacin] Nausea And Vomiting and Other (See Comments)    dizziness    Past Medical History, Surgical history, Social history, and Family History were reviewed and updated.  Review of Systems: Review of Systems  Constitutional: Negative.   HENT:  Negative.    Eyes: Negative.   Respiratory: Negative.    Cardiovascular: Negative.   Gastrointestinal: Negative.   Endocrine: Negative.   Genitourinary: Negative.    Musculoskeletal: Negative.   Skin: Negative.   Neurological: Negative.   Hematological: Negative.   Psychiatric/Behavioral: Negative.      Physical Exam:  height is 5'  6" (1.676 m) and weight is 189 lb (85.7 kg). Her oral temperature is 97.8 F (36.6 C). Her blood pressure is 168/66 (abnormal) and her pulse is 64. Her respiration is 20 and oxygen saturation is 100%.   Wt Readings from Last 3 Encounters:  01/23/23 189 lb (85.7 kg)  01/02/23 184 lb (83.5 kg)  12/05/22 191 lb 1.9 oz (86.7 kg)    Physical Exam Vitals reviewed.  Constitutional:      Comments: Chest wall exam shows right mastectomy.  This is healing very nicely.  She has no erythema or nodularity.  There is no right axillary adenopathy.  Left breast shows no masses, edema or erythema.  She has had no nodularity or inflammation or swelling.  There is no erythema.  There is no left axillary adenopathy.  HENT:     Head: Normocephalic and atraumatic.  Eyes:     Pupils: Pupils are equal, round, and reactive to light.  Cardiovascular:     Rate and Rhythm: Normal rate and regular rhythm.     Heart sounds: Normal heart sounds.  Pulmonary:     Effort: Pulmonary effort is normal.     Breath sounds: Normal breath sounds.  Abdominal:     General: Bowel sounds are normal.     Palpations: Abdomen is soft.  Musculoskeletal:        General: No tenderness or deformity. Normal range of motion.     Cervical back: Normal range of motion.  Lymphadenopathy:     Cervical: No cervical adenopathy.  Skin:    General: Skin is warm and dry.     Findings: No erythema or rash.  Neurological:     Mental Status: She is alert and oriented to person, place, and time.  Psychiatric:        Behavior: Behavior normal.        Thought Content: Thought content normal.        Judgment: Judgment normal.      Lab Results  Component Value Date   WBC 10.5 01/23/2023   HGB 10.8 (L) 01/23/2023   HCT 32.8 (L) 01/23/2023   MCV 89.9 01/23/2023   PLT 267 01/23/2023     Chemistry      Component Value Date/Time   NA 136 01/23/2023 1005   K 3.8 01/23/2023 1005   CL 98 01/23/2023 1005   CO2 28 01/23/2023 1005   BUN  25 (H) 01/23/2023 1005   CREATININE 1.40 (H) 01/23/2023 1005      Component Value Date/Time   CALCIUM 10.0 01/23/2023 1005   ALKPHOS 54 01/23/2023 1005   AST 23 01/23/2023 1005   ALT 16 01/23/2023 1005   BILITOT 0.3 01/23/2023 1005      Impression and  Plan: Brandi Roberts is a very charming 67 year old postmenopausal Afro-American female.  She has a invasive ductal carcinoma of the right breast.  She underwent a mastectomy.  She had 1 lymph node positive.  The tumor is ER -/PR-.  Unfortunately, her breast cancer is HER2 positive.  We have her on adjuvant therapy with Kadcyla.  She did not wish to have any chemotherapy.  However, she would take Kadcyla.  We will go ahead with Kadcyla.  This will be her 10th cycle of Kadcyla.  We will keep her on Kadcyla for 1 year.  I think we need 1 year of Kadcyla because of the positive lymph node.  We will have to see about physical therapy for her right shoulder and chest wall.  Again I suspect she probably has scar tissue that needs to be worked on.  Again she will tell us of the physical therapy facility up in [place].  Once we get that information, we will make the referral.  Otherwise, we will plan to get her back in another 3 weeks.   Volanda Napoleon, MD 1/29/202411:08 AM

## 2023-01-27 ENCOUNTER — Other Ambulatory Visit: Payer: Self-pay

## 2023-01-31 ENCOUNTER — Other Ambulatory Visit: Payer: Self-pay

## 2023-02-13 ENCOUNTER — Inpatient Hospital Stay: Payer: Medicare Other

## 2023-02-13 ENCOUNTER — Encounter: Payer: Self-pay | Admitting: Hematology & Oncology

## 2023-02-13 ENCOUNTER — Inpatient Hospital Stay: Payer: Medicare Other | Admitting: Hematology & Oncology

## 2023-02-13 ENCOUNTER — Inpatient Hospital Stay: Payer: Medicare Other | Attending: Hematology & Oncology

## 2023-02-13 VITALS — BP 161/58 | HR 73 | Temp 98.1°F | Resp 20 | Ht 66.0 in | Wt 192.4 lb

## 2023-02-13 VITALS — BP 151/52 | HR 61 | Resp 17

## 2023-02-13 DIAGNOSIS — Z171 Estrogen receptor negative status [ER-]: Secondary | ICD-10-CM

## 2023-02-13 DIAGNOSIS — Z7901 Long term (current) use of anticoagulants: Secondary | ICD-10-CM | POA: Insufficient documentation

## 2023-02-13 DIAGNOSIS — Z79899 Other long term (current) drug therapy: Secondary | ICD-10-CM | POA: Diagnosis not present

## 2023-02-13 DIAGNOSIS — C50911 Malignant neoplasm of unspecified site of right female breast: Secondary | ICD-10-CM

## 2023-02-13 DIAGNOSIS — Z7984 Long term (current) use of oral hypoglycemic drugs: Secondary | ICD-10-CM | POA: Diagnosis not present

## 2023-02-13 DIAGNOSIS — Z9011 Acquired absence of right breast and nipple: Secondary | ICD-10-CM | POA: Diagnosis not present

## 2023-02-13 LAB — CMP (CANCER CENTER ONLY)
ALT: 20 U/L (ref 0–44)
AST: 35 U/L (ref 15–41)
Albumin: 3.7 g/dL (ref 3.5–5.0)
Alkaline Phosphatase: 62 U/L (ref 38–126)
Anion gap: 9 (ref 5–15)
BUN: 14 mg/dL (ref 8–23)
CO2: 28 mmol/L (ref 22–32)
Calcium: 9.2 mg/dL (ref 8.9–10.3)
Chloride: 94 mmol/L — ABNORMAL LOW (ref 98–111)
Creatinine: 1.01 mg/dL — ABNORMAL HIGH (ref 0.44–1.00)
GFR, Estimated: >60 mL/min (ref >60)
Glucose, Bld: 101 mg/dL — ABNORMAL HIGH (ref 70–99)
Potassium: 3.1 mmol/L — ABNORMAL LOW (ref 3.5–5.1)
Sodium: 131 mmol/L — ABNORMAL LOW (ref 135–145)
Total Bilirubin: 0.3 mg/dL (ref 0.3–1.2)
Total Protein: 7.3 g/dL (ref 6.5–8.1)

## 2023-02-13 LAB — CBC WITH DIFFERENTIAL (CANCER CENTER ONLY)
Abs Immature Granulocytes: 0.11 10*3/uL — ABNORMAL HIGH (ref 0.00–0.07)
Basophils Absolute: 0.1 10*3/uL (ref 0.0–0.1)
Basophils Relative: 1 %
Eosinophils Absolute: 0.1 10*3/uL (ref 0.0–0.5)
Eosinophils Relative: 1 %
HCT: 30.3 % — ABNORMAL LOW (ref 36.0–46.0)
Hemoglobin: 10.3 g/dL — ABNORMAL LOW (ref 12.0–15.0)
Immature Granulocytes: 1 %
Lymphocytes Relative: 23 %
Lymphs Abs: 2.2 10*3/uL (ref 0.7–4.0)
MCH: 29.9 pg (ref 26.0–34.0)
MCHC: 34 g/dL (ref 30.0–36.0)
MCV: 88.1 fL (ref 80.0–100.0)
Monocytes Absolute: 0.9 10*3/uL (ref 0.1–1.0)
Monocytes Relative: 9 %
Neutro Abs: 6.2 10*3/uL (ref 1.7–7.7)
Neutrophils Relative %: 65 %
Platelet Count: 268 10*3/uL (ref 150–400)
RBC: 3.44 MIL/uL — ABNORMAL LOW (ref 3.87–5.11)
RDW: 13.2 % (ref 11.5–15.5)
WBC Count: 9.6 10*3/uL (ref 4.0–10.5)
nRBC: 0 % (ref 0.0–0.2)

## 2023-02-13 MED ORDER — ACETAMINOPHEN 325 MG PO TABS
650.0000 mg | ORAL_TABLET | Freq: Once | ORAL | Status: AC
  Administered 2023-02-13: 650 mg via ORAL
  Filled 2023-02-13: qty 2

## 2023-02-13 MED ORDER — PROCHLORPERAZINE MALEATE 10 MG PO TABS
10.0000 mg | ORAL_TABLET | Freq: Once | ORAL | Status: AC
  Administered 2023-02-13: 10 mg via ORAL
  Filled 2023-02-13: qty 1

## 2023-02-13 MED ORDER — HEPARIN SOD (PORK) LOCK FLUSH 100 UNIT/ML IV SOLN
500.0000 [IU] | Freq: Once | INTRAVENOUS | Status: AC | PRN
  Administered 2023-02-13: 500 [IU]

## 2023-02-13 MED ORDER — DIPHENHYDRAMINE HCL 25 MG PO CAPS
50.0000 mg | ORAL_CAPSULE | Freq: Once | ORAL | Status: AC
  Administered 2023-02-13: 50 mg via ORAL
  Filled 2023-02-13: qty 2

## 2023-02-13 MED ORDER — SODIUM CHLORIDE 0.9% FLUSH
10.0000 mL | INTRAVENOUS | Status: DC | PRN
  Administered 2023-02-13: 10 mL

## 2023-02-13 MED ORDER — PROCHLORPERAZINE EDISYLATE 10 MG/2ML IJ SOLN: 10.0000 mg | Freq: Once | INTRAMUSCULAR | Status: DC

## 2023-02-13 MED ORDER — SODIUM CHLORIDE 0.9 % IV SOLN: Freq: Once | INTRAVENOUS | Status: AC

## 2023-02-13 MED ORDER — SODIUM CHLORIDE 0.9 % IV SOLN
3.6000 mg/kg | Freq: Once | INTRAVENOUS | Status: AC
  Administered 2023-02-13: 320 mg via INTRAVENOUS
  Filled 2023-02-13: qty 16

## 2023-02-13 NOTE — Patient Instructions (Signed)

## 2023-02-13 NOTE — Progress Notes (Signed)
Hematology and Oncology Follow Up Visit  Brandi Roberts [id number] 06/04/1956 67 y.o. 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  She is doing pretty well.  She had a wonderful times.  It sounds like she made pretty good on Valentine's Day from her family.  She is not as tight over on the right side.  When we last saw her, she was having some tightness in the right anterior chest wall and right shoulder.  She says she is doing a little bit better.  Her last echocardiogram was done back in November.  She had an ejection fraction of 55 to 60%.  She has had no fever.  She has had no bleeding.  There is been no change in bowel or bladder habits.  She has had no nausea or vomiting.  She has had no leg swelling.  Overall, I would say performance status is probably ECOG 1.  Medications:  Current Outpatient Medications:    albuterol (VENTOLIN HFA) 108 (90 Base) MCG/ACT inhaler, Inhale 2 puffs into the lungs every 6 (six) hours as needed., Disp: , Rfl:    ALPRAZolam (XANAX) 0.5 MG tablet, Take 0.5 mg by mouth 2 (two) times daily as needed., Disp: , Rfl:    amLODipine (NORVASC) 10 MG tablet, Take 10 mg by mouth daily., Disp: , Rfl:    atorvastatin (LIPITOR) 10 MG tablet, Take 5 mg by mouth at bedtime., Disp: , Rfl:    benazepril (LOTENSIN) 40 MG tablet, Take 40 mg by mouth daily., Disp: , Rfl:    doxazosin (CARDURA) 2 MG tablet, Take 2 mg by mouth 2 (two) times daily., Disp: , Rfl:    ELIQUIS 5 MG TABS tablet, Take 5 mg by mouth 2 (two) times daily., Disp: , Rfl:    Ferrous Sulfate (IRON) 325 (65 Fe) MG TABS, Take 1 tablet by mouth daily., Disp: , Rfl:    gabapentin (NEURONTIN) 600 MG tablet, Take 600 mg by mouth at bedtime., Disp: , Rfl:     hydrALAZINE (APRESOLINE) 50 MG tablet, Take 50 mg by mouth 2 (two) times daily., Disp: , Rfl:    HYDROcodone-acetaminophen (NORCO/VICODIN) 5-325 MG tablet, Take 2 tablets by mouth 2 (two) times daily., Disp: , Rfl:    metFORMIN (GLUCOPHAGE) 1000 MG tablet, Take 1,000 mg by mouth 2 (two) times daily., Disp: , Rfl:    metoprolol succinate (TOPROL-XL) 100 MG 24 hr tablet, Take 100 mg by mouth daily., Disp: , Rfl:    sertraline (ZOLOFT) 25 MG tablet, Take 1 tablet (25 mg total) by mouth daily., Disp: 30 tablet, Rfl: 3   triamterene-hydrochlorothiazide (MAXZIDE) 75-50 MG per tablet, Take 1 tablet by mouth daily., Disp: , Rfl:    Vitamin D, Ergocalciferol, (DRISDOL) 1.25 MG (50000 UNIT) CAPS capsule, Take 50,000 Units by mouth once a week., Disp: , Rfl:    ondansetron (ZOFRAN-ODT) 4 MG disintegrating tablet, Take 1 tablet (4 mg total) by mouth every 8 (eight) hours as needed for nausea or vomiting. (Patient not taking: Reported on 02/13/2023), Disp: 20 tablet, Rfl: 0 No current facility-administered medications for this visit.  Facility-Administered Medications Ordered in Other Visits:    diphenhydrAMINE (BENADRYL) 25 mg capsule, , , ,   Allergies:  Allergies  Allergen  Reactions   Levaquin [Levofloxacin] Nausea And Vomiting and Other (See Comments)    dizziness    Past Medical History, Surgical history, Social history, and Family History were reviewed and updated.  Review of Systems: Review of Systems  Constitutional: Negative.   HENT:  Negative.    Eyes: Negative.   Respiratory: Negative.    Cardiovascular: Negative.   Gastrointestinal: Negative.   Endocrine: Negative.   Genitourinary: Negative.    Musculoskeletal: Negative.   Skin: Negative.   Neurological: Negative.   Hematological: Negative.   Psychiatric/Behavioral: Negative.      Physical Exam:  height is 5' 6"$  (1.676 m) and weight is 192 lb 6.4 oz (87.3 kg). Her oral temperature is 98.1 F (36.7 C). Her blood pressure is  161/58 (abnormal) and her pulse is 73. Her respiration is 20 and oxygen saturation is 99%.   Wt Readings from Last 3 Encounters:  02/13/23 192 lb 6.4 oz (87.3 kg)  01/23/23 189 lb (85.7 kg)  01/02/23 184 lb (83.5 kg)    Physical Exam Vitals reviewed.  Constitutional:      Comments: Chest wall exam shows right mastectomy.  This is healing very nicely.  She has no erythema or nodularity.  There is no right axillary adenopathy.  Left breast shows no masses, edema or erythema.  She has had no nodularity or inflammation or swelling.  There is no erythema.  There is no left axillary adenopathy.  HENT:     Head: Normocephalic and atraumatic.  Eyes:     Pupils: Pupils are equal, round, and reactive to light.  Cardiovascular:     Rate and Rhythm: Normal rate and regular rhythm.     Heart sounds: Normal heart sounds.  Pulmonary:     Effort: Pulmonary effort is normal.     Breath sounds: Normal breath sounds.  Abdominal:     General: Bowel sounds are normal.     Palpations: Abdomen is soft.  Musculoskeletal:        General: No tenderness or deformity. Normal range of motion.     Cervical back: Normal range of motion.  Lymphadenopathy:     Cervical: No cervical adenopathy.  Skin:    General: Skin is warm and dry.     Findings: No erythema or rash.  Neurological:     Mental Status: She is alert and oriented to person, place, and time.  Psychiatric:        Behavior: Behavior normal.        Thought Content: Thought content normal.        Judgment: Judgment normal.     Lab Results  Component Value Date   WBC 9.6 02/13/2023   HGB 10.3 (L) 02/13/2023   HCT 30.3 (L) 02/13/2023   MCV 88.1 02/13/2023   PLT 268 02/13/2023     Chemistry      Component Value Date/Time   NA 131 (L) 02/13/2023 1010   K 3.1 (L) 02/13/2023 1010   CL 94 (L) 02/13/2023 1010   CO2 28 02/13/2023 1010   BUN 14 02/13/2023 1010   CREATININE 1.01 (H) 02/13/2023 1010      Component Value Date/Time   CALCIUM  9.2 02/13/2023 1010   ALKPHOS 62 02/13/2023 1010   AST 35 02/13/2023 1010   ALT 20 02/13/2023 1010   BILITOT 0.3 02/13/2023 1010      Impression and Plan: Brandi Roberts is a very charming 67 year old postmenopausal Afro-American female.  She has a invasive ductal carcinoma of the  right breast.  She underwent a mastectomy.  She had 1 lymph node positive.  The tumor is ER -/PR-.  Unfortunately, her breast cancer is HER2 positive.  We have her on adjuvant therapy with Kadcyla.  She did not wish to have any chemotherapy.  However, she would take Kadcyla.  We will go ahead with Kadcyla.  This will be her 10th cycle of Kadcyla.  We will keep her on Kadcyla for 1 year.  I think we need 1 year of Kadcyla because of the positive lymph node.  I am just happy that she is doing so well.  We will go ahead and plan for follow-up in another 3 weeks.     Volanda Napoleon, MD 2/19/202411:01 AM

## 2023-02-13 NOTE — Patient Instructions (Signed)
[facility]  Discharge Instructions: Thank you for choosing [facility] to provide your oncology and hematology care.   If you have a lab appointment with the Cancer Center, please go directly to the Cancer Center and check in at the registration area.  Wear comfortable clothing and clothing appropriate for easy access to any Portacath or PICC line.   We strive to give you quality time with your provider. You may need to reschedule your appointment if you arrive late (15 or more minutes).  Arriving late affects you and other patients whose appointments are after yours.  Also, if you miss three or more appointments without notifying the office, you may be dismissed from the clinic at the provider's discretion.      For prescription refill requests, have your pharmacy contact our office and allow 72 hours for refills to be completed.    Today you received the following chemotherapy and/or immunotherapy agents Kadcyla      To help prevent nausea and vomiting after your treatment, we encourage you to take your nausea medication as directed.  BELOW ARE SYMPTOMS THAT SHOULD BE REPORTED IMMEDIATELY: *FEVER GREATER THAN 100.4 F (38 C) OR HIGHER *CHILLS OR SWEATING *NAUSEA AND VOMITING THAT IS NOT CONTROLLED WITH YOUR NAUSEA MEDICATION *UNUSUAL SHORTNESS OF BREATH *UNUSUAL BRUISING OR BLEEDING *URINARY PROBLEMS (pain or burning when urinating, or frequent urination) *BOWEL PROBLEMS (unusual diarrhea, constipation, pain near the anus) TENDERNESS IN MOUTH AND THROAT WITH OR WITHOUT PRESENCE OF ULCERS (sore throat, sores in mouth, or a toothache) UNUSUAL RASH, SWELLING OR PAIN  UNUSUAL VAGINAL DISCHARGE OR ITCHING   Items with * indicate a potential emergency and should be followed up as soon as possible or go to the Emergency Department if any problems should occur.  Please show the CHEMOTHERAPY ALERT CARD or IMMUNOTHERAPY ALERT CARD at check-in  to the Emergency Department and triage nurse. Should you have questions after your visit or need to cancel or reschedule your appointment, please contact [facility]  [phone] and follow the prompts.  Office hours are 8:00 a.m. to 4:30 p.m. Monday - Friday. Please note that voicemails left after 4:00 p.m. may not be returned until the following business day.  We are closed weekends and major holidays. You have access to a nurse at all times for urgent questions. Please call the main number to the clinic [phone] and follow the prompts.  For any non-urgent questions, you may also contact your provider using MyChart. We now offer e-Visits for anyone 18 and older to request care online for non-urgent symptoms. For details visit [email or web address].   Also download the MyChart app! Go to the app store, search "MyChart", open the app, select [facility], and log in with your MyChart username and password.   

## 2023-02-14 ENCOUNTER — Encounter: Payer: Self-pay | Admitting: *Deleted

## 2023-02-14 ENCOUNTER — Other Ambulatory Visit: Payer: Self-pay

## 2023-02-14 NOTE — Progress Notes (Signed)
Received cycle 10. She will continue for until one year.  Oncology Nurse Navigator Documentation     02/14/2023    8:45 AM  Oncology Nurse Navigator Flowsheets  Navigator Follow Up Date: 03/06/2023  Navigator Follow Up Reason: Follow-up Appointment;Chemotherapy  Navigator Location [facility]  Navigator Encounter Type Appt/Treatment Plan Review  Patient Visit Type MedOnc  Treatment Phase Active Tx  Barriers/Navigation Needs No Barriers At This Time  Interventions None Required  Acuity Level 1-No Barriers  Support Groups/Services Friends and Family  Time Spent with Patient 15

## 2023-02-23 ENCOUNTER — Other Ambulatory Visit: Payer: Self-pay

## 2023-03-02 ENCOUNTER — Other Ambulatory Visit: Payer: Self-pay | Admitting: *Deleted

## 2023-03-02 ENCOUNTER — Telehealth: Payer: Self-pay | Admitting: *Deleted

## 2023-03-02 ENCOUNTER — Inpatient Hospital Stay: Payer: Medicare Other | Attending: Hematology & Oncology

## 2023-03-02 DIAGNOSIS — C50911 Malignant neoplasm of unspecified site of right female breast: Secondary | ICD-10-CM

## 2023-03-02 DIAGNOSIS — Z171 Estrogen receptor negative status [ER-]: Secondary | ICD-10-CM | POA: Insufficient documentation

## 2023-03-02 DIAGNOSIS — Z5111 Encounter for antineoplastic chemotherapy: Secondary | ICD-10-CM | POA: Insufficient documentation

## 2023-03-02 DIAGNOSIS — Z9011 Acquired absence of right breast and nipple: Secondary | ICD-10-CM | POA: Insufficient documentation

## 2023-03-02 NOTE — Telephone Encounter (Signed)
Message received from patient wanting to know how many more treatments she is going to need and that she may choose to stop treatments d/t the financial burden the treatments have become. Call placed back to pt to inform her that she has two treatments left and that a SW referral has been placed to help her with bills/finances if possible.  Pt is appreciative of assistance and has no further questions at this time.

## 2023-03-02 NOTE — Progress Notes (Signed)
[place] Work  Clinical Social Work was referred by Art therapist for assessment of psychosocial needs.  Clinical Social Worker contacted patient by phone to offer support and assess for needs.    Patient was considering stopping her chemotherapy due to medical bills.  She stated the bills are causing her increased financial strain.  CSW made a referral to Mort Sawyers for the [employer].  Began Farrel Demark for breast cancer patient's.  Nyra Capes the contact number for [employer] for medical bill assistance.  CSW also gave  contact information.. Provided active listening and supportive counseling.     Margaree Mackintosh, LCSW  Clinical Social Worker [facility]

## 2023-03-06 ENCOUNTER — Inpatient Hospital Stay (HOSPITAL_BASED_OUTPATIENT_CLINIC_OR_DEPARTMENT_OTHER): Payer: Medicare Other | Admitting: Hematology & Oncology

## 2023-03-06 ENCOUNTER — Inpatient Hospital Stay: Payer: Medicare Other

## 2023-03-06 ENCOUNTER — Encounter: Payer: Self-pay | Admitting: Hematology & Oncology

## 2023-03-06 ENCOUNTER — Other Ambulatory Visit: Payer: Self-pay

## 2023-03-06 ENCOUNTER — Encounter: Payer: Self-pay | Admitting: *Deleted

## 2023-03-06 VITALS — BP 149/55 | HR 60 | Resp 17

## 2023-03-06 DIAGNOSIS — C50911 Malignant neoplasm of unspecified site of right female breast: Secondary | ICD-10-CM

## 2023-03-06 DIAGNOSIS — Z5111 Encounter for antineoplastic chemotherapy: Secondary | ICD-10-CM | POA: Diagnosis present

## 2023-03-06 DIAGNOSIS — Z171 Estrogen receptor negative status [ER-]: Secondary | ICD-10-CM | POA: Diagnosis not present

## 2023-03-06 DIAGNOSIS — Z9011 Acquired absence of right breast and nipple: Secondary | ICD-10-CM | POA: Diagnosis not present

## 2023-03-06 LAB — CMP (CANCER CENTER ONLY)
ALT: 17 U/L (ref 0–44)
AST: 27 U/L (ref 15–41)
Albumin: 3.6 g/dL (ref 3.5–5.0)
Alkaline Phosphatase: 56 U/L (ref 38–126)
Anion gap: 9 (ref 5–15)
BUN: 19 mg/dL (ref 8–23)
CO2: 29 mmol/L (ref 22–32)
Calcium: 9.4 mg/dL (ref 8.9–10.3)
Chloride: 95 mmol/L — ABNORMAL LOW (ref 98–111)
Creatinine: 1.12 mg/dL — ABNORMAL HIGH (ref 0.44–1.00)
GFR, Estimated: 53 mL/min — ABNORMAL LOW (ref >60)
Glucose, Bld: 95 mg/dL (ref 70–99)
Potassium: 3.4 mmol/L — ABNORMAL LOW (ref 3.5–5.1)
Sodium: 133 mmol/L — ABNORMAL LOW (ref 135–145)
Total Bilirubin: 0.3 mg/dL (ref 0.3–1.2)
Total Protein: 7.5 g/dL (ref 6.5–8.1)

## 2023-03-06 LAB — CBC WITH DIFFERENTIAL (CANCER CENTER ONLY)
Abs Immature Granulocytes: 0.03 10*3/uL (ref 0.00–0.07)
Basophils Absolute: 0.1 10*3/uL (ref 0.0–0.1)
Basophils Relative: 1 %
Eosinophils Absolute: 0.1 10*3/uL (ref 0.0–0.5)
Eosinophils Relative: 1 %
HCT: 32.4 % — ABNORMAL LOW (ref 36.0–46.0)
Hemoglobin: 11.1 g/dL — ABNORMAL LOW (ref 12.0–15.0)
Immature Granulocytes: 0 %
Lymphocytes Relative: 22 %
Lymphs Abs: 2.4 10*3/uL (ref 0.7–4.0)
MCH: 30.1 pg (ref 26.0–34.0)
MCHC: 34.3 g/dL (ref 30.0–36.0)
MCV: 87.8 fL (ref 80.0–100.0)
Monocytes Absolute: 0.9 10*3/uL (ref 0.1–1.0)
Monocytes Relative: 8 %
Neutro Abs: 7.2 10*3/uL (ref 1.7–7.7)
Neutrophils Relative %: 68 %
Platelet Count: 308 10*3/uL (ref 150–400)
RBC: 3.69 MIL/uL — ABNORMAL LOW (ref 3.87–5.11)
RDW: 13.2 % (ref 11.5–15.5)
WBC Count: 10.6 10*3/uL — ABNORMAL HIGH (ref 4.0–10.5)
nRBC: 0 % (ref 0.0–0.2)

## 2023-03-06 MED ORDER — SODIUM CHLORIDE 0.9 % IV SOLN: Freq: Once | INTRAVENOUS | Status: AC

## 2023-03-06 MED ORDER — DIPHENHYDRAMINE HCL 25 MG PO CAPS
50.0000 mg | ORAL_CAPSULE | Freq: Once | ORAL | Status: AC
  Administered 2023-03-06: 50 mg via ORAL
  Filled 2023-03-06: qty 2

## 2023-03-06 MED ORDER — PROCHLORPERAZINE EDISYLATE 10 MG/2ML IJ SOLN: 10.0000 mg | Freq: Once | INTRAMUSCULAR | Status: DC

## 2023-03-06 MED ORDER — ACETAMINOPHEN 325 MG PO TABS
650.0000 mg | ORAL_TABLET | Freq: Once | ORAL | Status: AC
  Administered 2023-03-06: 650 mg via ORAL
  Filled 2023-03-06: qty 2

## 2023-03-06 MED ORDER — SODIUM CHLORIDE 0.9% FLUSH
10.0000 mL | INTRAVENOUS | Status: DC | PRN
  Administered 2023-03-06: 10 mL

## 2023-03-06 MED ORDER — HEPARIN SOD (PORK) LOCK FLUSH 100 UNIT/ML IV SOLN
500.0000 [IU] | Freq: Once | INTRAVENOUS | Status: AC | PRN
  Administered 2023-03-06: 500 [IU]

## 2023-03-06 MED ORDER — PROCHLORPERAZINE MALEATE 10 MG PO TABS
10.0000 mg | ORAL_TABLET | Freq: Once | ORAL | Status: AC
  Administered 2023-03-06: 10 mg via ORAL
  Filled 2023-03-06: qty 1

## 2023-03-06 MED ORDER — SODIUM CHLORIDE 0.9 % IV SOLN
3.6000 mg/kg | Freq: Once | INTRAVENOUS | Status: AC
  Administered 2023-03-06: 320 mg via INTRAVENOUS
  Filled 2023-03-06: qty 16

## 2023-03-06 NOTE — Progress Notes (Signed)
Patient mentioned financial hardship to Dr Marin Olp. Lynn Duffy LCSW has been working with the patient, and in fact had some financial aid paperwork for her today to sign. Form was signed and scanned back to [facility].   Oncology Nurse Navigator Documentation     03/06/2023   11:30 AM  Oncology Nurse Navigator Flowsheets  Navigator Follow Up Date: 03/30/2023  Navigator Follow Up Reason: Follow-up Appointment;Chemotherapy  Navigator Location [facility]  Navigator Encounter Type Treatment;Appt/Treatment Plan Review  Patient Visit Type MedOnc  Treatment Phase Active Tx  Barriers/Navigation Needs Financial Toxicity  Interventions Coordination of Care  Acuity Level 1-No Barriers  Coordination of Care Other  Support Groups/Services Friends and Family  Time Spent with Patient 15

## 2023-03-06 NOTE — Patient Instructions (Signed)
[facility]  Discharge Instructions: Thank you for choosing [facility] to provide your oncology and hematology care.   If you have a lab appointment with the Cancer Center, please go directly to the Cancer Center and check in at the registration area.  Wear comfortable clothing and clothing appropriate for easy access to any Portacath or PICC line.   We strive to give you quality time with your provider. You may need to reschedule your appointment if you arrive late (15 or more minutes).  Arriving late affects you and other patients whose appointments are after yours.  Also, if you miss three or more appointments without notifying the office, you may be dismissed from the clinic at the provider's discretion.      For prescription refill requests, have your pharmacy contact our office and allow 72 hours for refills to be completed.    Today you received the following chemotherapy and/or immunotherapy agents Kadcyla      To help prevent nausea and vomiting after your treatment, we encourage you to take your nausea medication as directed.  BELOW ARE SYMPTOMS THAT SHOULD BE REPORTED IMMEDIATELY: *FEVER GREATER THAN 100.4 F (38 C) OR HIGHER *CHILLS OR SWEATING *NAUSEA AND VOMITING THAT IS NOT CONTROLLED WITH YOUR NAUSEA MEDICATION *UNUSUAL SHORTNESS OF BREATH *UNUSUAL BRUISING OR BLEEDING *URINARY PROBLEMS (pain or burning when urinating, or frequent urination) *BOWEL PROBLEMS (unusual diarrhea, constipation, pain near the anus) TENDERNESS IN MOUTH AND THROAT WITH OR WITHOUT PRESENCE OF ULCERS (sore throat, sores in mouth, or a toothache) UNUSUAL RASH, SWELLING OR PAIN  UNUSUAL VAGINAL DISCHARGE OR ITCHING   Items with * indicate a potential emergency and should be followed up as soon as possible or go to the Emergency Department if any problems should occur.  Please show the CHEMOTHERAPY ALERT CARD or IMMUNOTHERAPY ALERT CARD at check-in  to the Emergency Department and triage nurse. Should you have questions after your visit or need to cancel or reschedule your appointment, please contact [facility]  [phone] and follow the prompts.  Office hours are 8:00 a.m. to 4:30 p.m. Monday - Friday. Please note that voicemails left after 4:00 p.m. may not be returned until the following business day.  We are closed weekends and major holidays. You have access to a nurse at all times for urgent questions. Please call the main number to the clinic [phone] and follow the prompts.  For any non-urgent questions, you may also contact your provider using MyChart. We now offer e-Visits for anyone 18 and older to request care online for non-urgent symptoms. For details visit [email or web address].   Also download the MyChart app! Go to the app store, search "MyChart", open the app, select [facility], and log in with your MyChart username and password.   

## 2023-03-06 NOTE — Progress Notes (Signed)
Hematology and Oncology Follow Up Visit  Brandi Roberts [id number] 28-Jul-1956 67 y.o. 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  So far, she is doing nicely.  She had a nice weekend.  She was able to stay home.  She is looking forward to [facility] Sunday.  Sounds like she will be with her family.  She has had no problems with cough.  There is no shortness of breath.  She has had no nausea or vomiting.  She has had no bleeding.  There is no fever.  She has had no rashes.  She has had no leg swelling.  Currently, I would say performance status is probably ECOG 0.  Medications:  Current Outpatient Medications:    albuterol (VENTOLIN HFA) 108 (90 Base) MCG/ACT inhaler, Inhale 2 puffs into the lungs every 6 (six) hours as needed., Disp: , Rfl:    ALPRAZolam (XANAX) 0.5 MG tablet, Take 0.5 mg by mouth 2 (two) times daily as needed., Disp: , Rfl:    amLODipine (NORVASC) 10 MG tablet, Take 10 mg by mouth daily., Disp: , Rfl:    atorvastatin (LIPITOR) 10 MG tablet, Take 5 mg by mouth at bedtime., Disp: , Rfl:    benazepril (LOTENSIN) 40 MG tablet, Take 40 mg by mouth daily., Disp: , Rfl:    doxazosin (CARDURA) 2 MG tablet, Take 2 mg by mouth 2 (two) times daily., Disp: , Rfl:    ELIQUIS 5 MG TABS tablet, Take 5 mg by mouth 2 (two) times daily., Disp: , Rfl:    Ferrous Sulfate (IRON) 325 (65 Fe) MG TABS, Take 1 tablet by mouth daily., Disp: , Rfl:    gabapentin (NEURONTIN) 600 MG tablet, Take 600 mg by mouth at bedtime., Disp: , Rfl:    hydrALAZINE (APRESOLINE) 50 MG tablet, Take 50 mg by mouth 2 (two) times daily., Disp: , Rfl:    HYDROcodone-acetaminophen (NORCO/VICODIN) 5-325 MG tablet, Take 2 tablets by mouth 2 (two) times daily., Disp:  , Rfl:    metFORMIN (GLUCOPHAGE) 1000 MG tablet, Take 1,000 mg by mouth 2 (two) times daily., Disp: , Rfl:    metoprolol succinate (TOPROL-XL) 100 MG 24 hr tablet, Take 100 mg by mouth daily., Disp: , Rfl:    sertraline (ZOLOFT) 25 MG tablet, Take 1 tablet (25 mg total) by mouth daily., Disp: 30 tablet, Rfl: 3   triamterene-hydrochlorothiazide (MAXZIDE) 75-50 MG per tablet, Take 1 tablet by mouth daily., Disp: , Rfl:    Vitamin D, Ergocalciferol, (DRISDOL) 1.25 MG (50000 UNIT) CAPS capsule, Take 50,000 Units by mouth once a week., Disp: , Rfl:    ondansetron (ZOFRAN-ODT) 4 MG disintegrating tablet, Take 1 tablet (4 mg total) by mouth every 8 (eight) hours as needed for nausea or vomiting. (Patient not taking: Reported on 03/06/2023), Disp: 20 tablet, Rfl: 0 No current facility-administered medications for this visit.  Facility-Administered Medications Ordered in Other Visits:    diphenhydrAMINE (BENADRYL) 25 mg capsule, , , ,   Allergies:  Allergies  Allergen Reactions   Levaquin [Levofloxacin] Nausea And Vomiting and Other (See Comments)    dizziness    Past Medical History, Surgical history, Social history, and Family History were reviewed and updated.  Review of Systems: Review of Systems  Constitutional: Negative.   HENT:  Negative.    Eyes: Negative.   Respiratory: Negative.    Cardiovascular: Negative.   Gastrointestinal: Negative.   Endocrine: Negative.   Genitourinary: Negative.    Musculoskeletal: Negative.   Skin: Negative.   Neurological: Negative.   Hematological: Negative.   Psychiatric/Behavioral: Negative.      Physical Exam:  height is '5\' 6"'$  (1.676 m) and weight is 190 lb 12.8 oz (86.5 kg). Her oral temperature is 97.9 F (36.6 C). Her blood pressure is 177/64 (abnormal) and her pulse is 73. Her respiration is 18 and oxygen saturation is 97%.   Wt Readings from Last 3 Encounters:  03/06/23 190 lb 12.8 oz (86.5 kg)  02/13/23 192 lb 6.4 oz (87.3 kg)  01/23/23  189 lb (85.7 kg)    Physical Exam Vitals reviewed.  Constitutional:      Comments: Chest wall exam shows right mastectomy.  This is healing very nicely.  She has no erythema or nodularity.  There is no right axillary adenopathy.  Left breast shows no masses, edema or erythema.  She has had no nodularity or inflammation or swelling.  There is no erythema.  There is no left axillary adenopathy.  HENT:     Head: Normocephalic and atraumatic.  Eyes:     Pupils: Pupils are equal, round, and reactive to light.  Cardiovascular:     Rate and Rhythm: Normal rate and regular rhythm.     Heart sounds: Normal heart sounds.  Pulmonary:     Effort: Pulmonary effort is normal.     Breath sounds: Normal breath sounds.  Abdominal:     General: Bowel sounds are normal.     Palpations: Abdomen is soft.  Musculoskeletal:        General: No tenderness or deformity. Normal range of motion.     Cervical back: Normal range of motion.  Lymphadenopathy:     Cervical: No cervical adenopathy.  Skin:    General: Skin is warm and dry.     Findings: No erythema or rash.  Neurological:     Mental Status: She is alert and oriented to person, place, and time.  Psychiatric:        Behavior: Behavior normal.        Thought Content: Thought content normal.        Judgment: Judgment normal.     Lab Results  Component Value Date   WBC 10.6 (H) 03/06/2023   HGB 11.1 (L) 03/06/2023   HCT 32.4 (L) 03/06/2023   MCV 87.8 03/06/2023   PLT 308 03/06/2023     Chemistry      Component Value Date/Time   NA 133 (L) 03/06/2023 1010   K 3.4 (L) 03/06/2023 1010   CL 95 (L) 03/06/2023 1010   CO2 29 03/06/2023 1010   BUN 19 03/06/2023 1010   CREATININE 1.12 (H) 03/06/2023 1010      Component Value Date/Time   CALCIUM 9.4 03/06/2023 1010   ALKPHOS 56 03/06/2023 1010   AST 27 03/06/2023 1010   ALT 17 03/06/2023 1010   BILITOT 0.3 03/06/2023 1010      Impression and Plan: Brandi Roberts is a very charming  67 year old postmenopausal Afro-American female.  She has a invasive ductal carcinoma of the right breast.  She underwent a mastectomy.  She had 1 lymph node positive.  The tumor is ER -/PR-.  Unfortunately, her breast cancer is HER2 positive.  We have her on adjuvant therapy with Kadcyla.  She did not wish to have any chemotherapy.  However, she would take Kadcyla.  I still feel that 1 year of Kadcyla is appropriate for her.  Hopefully, we can get her through 1 year without any kind of financial issues.  I know that this has been on her mind.  We will try to work on this for her.  Will plan to get her back in 3 weeks.    Volanda Napoleon, MD 3/11/202411:06 AM

## 2023-03-07 ENCOUNTER — Encounter: Payer: Self-pay | Admitting: Hematology & Oncology

## 2023-03-07 NOTE — Progress Notes (Signed)
Spoke with patient regarding the Brandi Roberts, she is going to call me back if interested in applying, she has my name and phone number.

## 2023-03-28 ENCOUNTER — Other Ambulatory Visit: Payer: Self-pay

## 2023-03-30 ENCOUNTER — Telehealth: Payer: Self-pay | Admitting: *Deleted

## 2023-03-30 ENCOUNTER — Inpatient Hospital Stay: Payer: Medicare Other

## 2023-03-30 ENCOUNTER — Encounter: Payer: Self-pay | Admitting: Hematology & Oncology

## 2023-03-30 ENCOUNTER — Inpatient Hospital Stay (HOSPITAL_BASED_OUTPATIENT_CLINIC_OR_DEPARTMENT_OTHER): Payer: Medicare Other | Admitting: Hematology & Oncology

## 2023-03-30 ENCOUNTER — Other Ambulatory Visit: Payer: Self-pay

## 2023-03-30 ENCOUNTER — Encounter: Payer: Self-pay | Admitting: *Deleted

## 2023-03-30 ENCOUNTER — Inpatient Hospital Stay: Payer: Medicare Other | Attending: Hematology & Oncology

## 2023-03-30 VITALS — BP 161/54 | HR 58 | Temp 97.1°F | Resp 16 | Ht 66.0 in | Wt 188.0 lb

## 2023-03-30 DIAGNOSIS — Z7901 Long term (current) use of anticoagulants: Secondary | ICD-10-CM | POA: Diagnosis not present

## 2023-03-30 DIAGNOSIS — E871 Hypo-osmolality and hyponatremia: Secondary | ICD-10-CM | POA: Diagnosis not present

## 2023-03-30 DIAGNOSIS — Z5111 Encounter for antineoplastic chemotherapy: Secondary | ICD-10-CM | POA: Insufficient documentation

## 2023-03-30 DIAGNOSIS — C50911 Malignant neoplasm of unspecified site of right female breast: Secondary | ICD-10-CM

## 2023-03-30 DIAGNOSIS — Z171 Estrogen receptor negative status [ER-]: Secondary | ICD-10-CM

## 2023-03-30 DIAGNOSIS — Z79899 Other long term (current) drug therapy: Secondary | ICD-10-CM | POA: Diagnosis not present

## 2023-03-30 DIAGNOSIS — Z9011 Acquired absence of right breast and nipple: Secondary | ICD-10-CM | POA: Diagnosis not present

## 2023-03-30 DIAGNOSIS — Z7984 Long term (current) use of oral hypoglycemic drugs: Secondary | ICD-10-CM | POA: Insufficient documentation

## 2023-03-30 LAB — CBC WITH DIFFERENTIAL (CANCER CENTER ONLY)
Abs Immature Granulocytes: 0.01 10*3/uL (ref 0.00–0.07)
Basophils Absolute: 0 10*3/uL (ref 0.0–0.1)
Basophils Relative: 1 %
Eosinophils Absolute: 0 10*3/uL (ref 0.0–0.5)
Eosinophils Relative: 1 %
HCT: 30.2 % — ABNORMAL LOW (ref 36.0–46.0)
Hemoglobin: 11 g/dL — ABNORMAL LOW (ref 12.0–15.0)
Immature Granulocytes: 0 %
Lymphocytes Relative: 25 %
Lymphs Abs: 1.9 10*3/uL (ref 0.7–4.0)
MCH: 30.1 pg (ref 26.0–34.0)
MCHC: 36.4 g/dL — ABNORMAL HIGH (ref 30.0–36.0)
MCV: 82.5 fL (ref 80.0–100.0)
Monocytes Absolute: 0.8 10*3/uL (ref 0.1–1.0)
Monocytes Relative: 10 %
Neutro Abs: 4.8 10*3/uL (ref 1.7–7.7)
Neutrophils Relative %: 63 %
Platelet Count: 268 10*3/uL (ref 150–400)
RBC: 3.66 MIL/uL — ABNORMAL LOW (ref 3.87–5.11)
RDW: 12.6 % (ref 11.5–15.5)
WBC Count: 7.6 10*3/uL (ref 4.0–10.5)
nRBC: 0 % (ref 0.0–0.2)

## 2023-03-30 LAB — CMP (CANCER CENTER ONLY)
ALT: 28 U/L (ref 0–44)
AST: 59 U/L — ABNORMAL HIGH (ref 15–41)
Albumin: 3 g/dL — ABNORMAL LOW (ref 3.5–5.0)
Alkaline Phosphatase: 55 U/L (ref 38–126)
Anion gap: 10 (ref 5–15)
BUN: 18 mg/dL (ref 8–23)
CO2: 25 mmol/L (ref 22–32)
Calcium: 8.4 mg/dL — ABNORMAL LOW (ref 8.9–10.3)
Chloride: 84 mmol/L — ABNORMAL LOW (ref 98–111)
Creatinine: 1.01 mg/dL — ABNORMAL HIGH (ref 0.44–1.00)
GFR, Estimated: >60 mL/min (ref >60)
Glucose, Bld: 99 mg/dL (ref 70–99)
Potassium: 3.3 mmol/L — ABNORMAL LOW (ref 3.5–5.1)
Sodium: 119 mmol/L — CL (ref 135–145)
Total Bilirubin: 0.3 mg/dL (ref 0.3–1.2)
Total Protein: 6.8 g/dL (ref 6.5–8.1)

## 2023-03-30 LAB — BASIC METABOLIC PANEL - CANCER CENTER ONLY
Anion gap: 10 (ref 5–15)
BUN: 18 mg/dL (ref 8–23)
CO2: 23 mmol/L (ref 22–32)
Calcium: 8.3 mg/dL — ABNORMAL LOW (ref 8.9–10.3)
Chloride: 85 mmol/L — ABNORMAL LOW (ref 98–111)
Creatinine: 0.91 mg/dL (ref 0.44–1.00)
GFR, Estimated: >60 mL/min (ref >60)
Glucose, Bld: 90 mg/dL (ref 70–99)
Potassium: 3 mmol/L — ABNORMAL LOW (ref 3.5–5.1)
Sodium: 118 mmol/L — CL (ref 135–145)

## 2023-03-30 LAB — LACTATE DEHYDROGENASE: LDH: 311 U/L — ABNORMAL HIGH (ref 98–192)

## 2023-03-30 MED ORDER — HEPARIN SOD (PORK) LOCK FLUSH 100 UNIT/ML IV SOLN
500.0000 [IU] | Freq: Once | INTRAVENOUS | Status: AC
  Administered 2023-03-30: 500 [IU] via INTRAVENOUS

## 2023-03-30 MED ORDER — SODIUM CHLORIDE 0.9% FLUSH
10.0000 mL | Freq: Once | INTRAVENOUS | Status: AC
  Administered 2023-03-30: 10 mL via INTRAVENOUS

## 2023-03-30 NOTE — Patient Instructions (Signed)

## 2023-03-30 NOTE — Progress Notes (Signed)
Patient's Na found to be 119. BMP repeated with Na of 118. No treatment today per Dr Marin Olp. Findings routed to PCP for possible adjustment in diuretics. Pt aware and verbalizes understanding. Pt to scheduling for new appts. dph

## 2023-03-30 NOTE — Telephone Encounter (Signed)
Dr. Marin Olp notified of NA-119.  Order received for stat BMP to be drawn now.

## 2023-03-30 NOTE — Progress Notes (Addendum)
Hematology and Oncology Follow Up Visit  Brandi Roberts [id number] Mar 01, 1956 67 y.o. 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  She did have a very wonderful Easter with her family.  They went out to eat and had a good time.  She feels well.  She has had no complaints.  There is been no problems with nausea or vomiting.  There is no cough or shortness of breath.  She has had no change in bowel or bladder habits.  She has had no diarrhea.  There is been no rashes.  She has had no leg swelling.  There is been no bleeding.  Overall, I would say that her performance status is probably ECOG 1.    Medications:  Current Outpatient Medications:    albuterol (VENTOLIN HFA) 108 (90 Base) MCG/ACT inhaler, Inhale 2 puffs into the lungs every 6 (six) hours as needed., Disp: , Rfl:    ALPRAZolam (XANAX) 0.5 MG tablet, Take 0.5 mg by mouth 2 (two) times daily as needed., Disp: , Rfl:    amLODipine (NORVASC) 10 MG tablet, Take 10 mg by mouth daily., Disp: , Rfl:    atorvastatin (LIPITOR) 10 MG tablet, Take 5 mg by mouth at bedtime., Disp: , Rfl:    benazepril (LOTENSIN) 40 MG tablet, Take 40 mg by mouth daily., Disp: , Rfl:    doxazosin (CARDURA) 2 MG tablet, Take 2 mg by mouth 2 (two) times daily., Disp: , Rfl:    ELIQUIS 5 MG TABS tablet, Take 5 mg by mouth 2 (two) times daily., Disp: , Rfl:    Ferrous Sulfate (IRON) 325 (65 Fe) MG TABS, Take 1 tablet by mouth daily., Disp: , Rfl:    gabapentin (NEURONTIN) 600 MG tablet, Take 600 mg by mouth at bedtime., Disp: , Rfl:    hydrALAZINE (APRESOLINE) 50 MG tablet, Take 50 mg by mouth 2 (two) times daily., Disp: , Rfl:    HYDROcodone-acetaminophen (NORCO/VICODIN) 5-325 MG tablet, Take 2 tablets by  mouth 2 (two) times daily., Disp: , Rfl:    metFORMIN (GLUCOPHAGE) 1000 MG tablet, Take 1,000 mg by mouth 2 (two) times daily., Disp: , Rfl:    metoprolol succinate (TOPROL-XL) 100 MG 24 hr tablet, Take 100 mg by mouth daily., Disp: , Rfl:    ondansetron (ZOFRAN-ODT) 4 MG disintegrating tablet, Take 1 tablet (4 mg total) by mouth every 8 (eight) hours as needed for nausea or vomiting. (Patient not taking: Reported on 03/06/2023), Disp: 20 tablet, Rfl: 0   sertraline (ZOLOFT) 25 MG tablet, Take 1 tablet (25 mg total) by mouth daily., Disp: 30 tablet, Rfl: 3   triamterene-hydrochlorothiazide (MAXZIDE) 75-50 MG per tablet, Take 1 tablet by mouth daily., Disp: , Rfl:    Vitamin D, Ergocalciferol, (DRISDOL) 1.25 MG (50000 UNIT) CAPS capsule, Take 50,000 Units by mouth once a week., Disp: , Rfl:  No current facility-administered medications for this visit.  Facility-Administered Medications Ordered in Other Visits:    diphenhydrAMINE (BENADRYL) 25 mg capsule, , , ,   Allergies:  Allergies  Allergen Reactions   Levaquin [Levofloxacin] Nausea And Vomiting and Other (See Comments)    dizziness    Past Medical History, Surgical history, Social history, and Family History were reviewed and updated.  Review of Systems: Review of Systems  Constitutional: Negative.   HENT:  Negative.    Eyes: Negative.   Respiratory: Negative.    Cardiovascular: Negative.   Gastrointestinal: Negative.   Endocrine: Negative.   Genitourinary: Negative.    Musculoskeletal: Negative.   Skin: Negative.   Neurological: Negative.   Hematological: Negative.   Psychiatric/Behavioral: Negative.      Physical Exam:  height is 5\' 6"  (1.676 m) and weight is 188 lb (85.3 kg). Her oral temperature is 97.1 F (36.2 C) (abnormal). Her blood pressure is 161/54 (abnormal) and her pulse is 58 (abnormal). Her respiration is 16 and oxygen saturation is 100%.   Wt Readings from Last 3 Encounters:  03/30/23 188 lb (85.3 kg)   03/06/23 190 lb 12.8 oz (86.5 kg)  02/13/23 192 lb 6.4 oz (87.3 kg)    Physical Exam Vitals reviewed.  Constitutional:      Comments: Chest wall exam shows right mastectomy.  This is healing very nicely.  She has no erythema or nodularity.  There is no right axillary adenopathy.  Left breast shows no masses, edema or erythema.  She has had no nodularity or inflammation or swelling.  There is no erythema.  There is no left axillary adenopathy.  HENT:     Head: Normocephalic and atraumatic.  Eyes:     Pupils: Pupils are equal, round, and reactive to light.  Cardiovascular:     Rate and Rhythm: Normal rate and regular rhythm.     Heart sounds: Normal heart sounds.  Pulmonary:     Effort: Pulmonary effort is normal.     Breath sounds: Normal breath sounds.  Abdominal:     General: Bowel sounds are normal.     Palpations: Abdomen is soft.  Musculoskeletal:        General: No tenderness or deformity. Normal range of motion.     Cervical back: Normal range of motion.  Lymphadenopathy:     Cervical: No cervical adenopathy.  Skin:    General: Skin is warm and dry.     Findings: No erythema or rash.  Neurological:     Mental Status: She is alert and oriented to person, place, and time.  Psychiatric:        Behavior: Behavior normal.        Thought Content: Thought content normal.        Judgment: Judgment normal.      Lab Results  Component Value Date   WBC 7.6 03/30/2023   HGB 11.0 (L) 03/30/2023   HCT 30.2 (L) 03/30/2023   MCV 82.5 03/30/2023   PLT 268 03/30/2023     Chemistry      Component Value Date/Time   NA 133 (L) 03/06/2023 1010   K 3.4 (L) 03/06/2023 1010   CL 95 (L) 03/06/2023 1010   CO2 29 03/06/2023 1010   BUN 19 03/06/2023 1010   CREATININE 1.12 (H) 03/06/2023 1010      Component Value Date/Time   CALCIUM 9.4 03/06/2023 1010   ALKPHOS 56 03/06/2023 1010   AST 27 03/06/2023 1010   ALT 17 03/06/2023 1010   BILITOT 0.3 03/06/2023 1010       Impression and Plan: Brandi Roberts is a very charming 67 year old postmenopausal Afro-American female.  She has a invasive ductal carcinoma of the right breast.  She underwent a mastectomy.  She had 1 lymph node positive.  The tumor is ER -/PR-.  Unfortunately, her breast cancer is HER2 positive.  We have  her on adjuvant therapy with Kadcyla.  She did not wish to have any chemotherapy.  However, she would take Kadcyla.  I still feel that 1 year of Kadcyla is appropriate for her.  Hopefully, we can get her through 1 year without any kind of financial issues.  I know that this has been on her mind.  Will plan to get her back in 3 weeks.    Volanda Napoleon, MD 4/4/202410:46 AM    ADDENDUM: Unfortunately, we are not can be able to treat her today.  We got her chemistry studies back and her sodium is quite low.  Her sodium was 119.  Her chloride was 84.  Typically, this is a sign of hypovolemia.  I looked at her medication list.  I do not see anything that she was on that would cause low sodium.  I do not see any diuretics that she is taking.  We are going to hold treatment today.  Will wait 1 week.  I told her to try some Gatorade or some Pedialyte at home to see if this may help.  I told her if she starts to have problems with dizziness, headaches, blurred vision, weakness, to let us know.  Again, I am unsure as to why she has the hyponatremia.  I would think though with the low chloride, that getting her volume back up might help.  Again we will plan to get her back next week.   [name], MD

## 2023-03-30 NOTE — Progress Notes (Signed)
Patient found to have a critical sodium level today. Treatment held and patient will follow up with PCP for diuretic management.  Oncology Nurse Navigator Documentation     03/30/2023   10:30 AM  Oncology Nurse Navigator Flowsheets  Navigator Follow Up Date: 04/25/2023  Navigator Follow Up Reason: Follow-up Appointment;Chemotherapy  Navigator Location [facility]  Navigator Encounter Type Appt/Treatment Plan Review  Patient Visit Type MedOnc  Treatment Phase Active Tx  Barriers/Navigation Needs Coordination of Care;Financial Toxicity  Interventions None Required  Acuity Level 1-No Barriers  Support Groups/Services Friends and Family  Time Spent with Patient 15

## 2023-03-30 NOTE — Addendum Note (Signed)
Addended by: Burney Gauze R on: 03/30/2023 11:46 AM   Modules accepted: Orders

## 2023-03-31 ENCOUNTER — Other Ambulatory Visit: Payer: Self-pay

## 2023-04-01 ENCOUNTER — Other Ambulatory Visit: Payer: Self-pay

## 2023-04-01 IMAGING — MG DIGITAL DIAGNOSTIC BILAT W/ TOMO W/ CAD
8 of 12 series · 8 of 28 positions shown · non-contrast
Comparison: Previous exams including recent screening mammogram
dated 11/11/2021 and breast MRI report dated 04/08/2008.

CLINICAL DATA: History of RIGHT breast central lumpectomy for
breast cancer in 7449. Subsequent recurrent DCIS manifesting as
calcifications within the RIGHT lateral breast anteriorly in September 2007.

Patient returns today to evaluate possible bilateral breast
asymmetries questioned on recent screening mammogram of 11/11/2021.
EXAM:
DIGITAL DIAGNOSTIC BILATERAL MAMMOGRAM WITH TOMOSYNTHESIS AND CAD
TECHNIQUE: Bilateral digital diagnostic mammography and breast tomosynthesis
was performed. The images were evaluated with computer-aided
detection.

[R CC (1 of 2)]
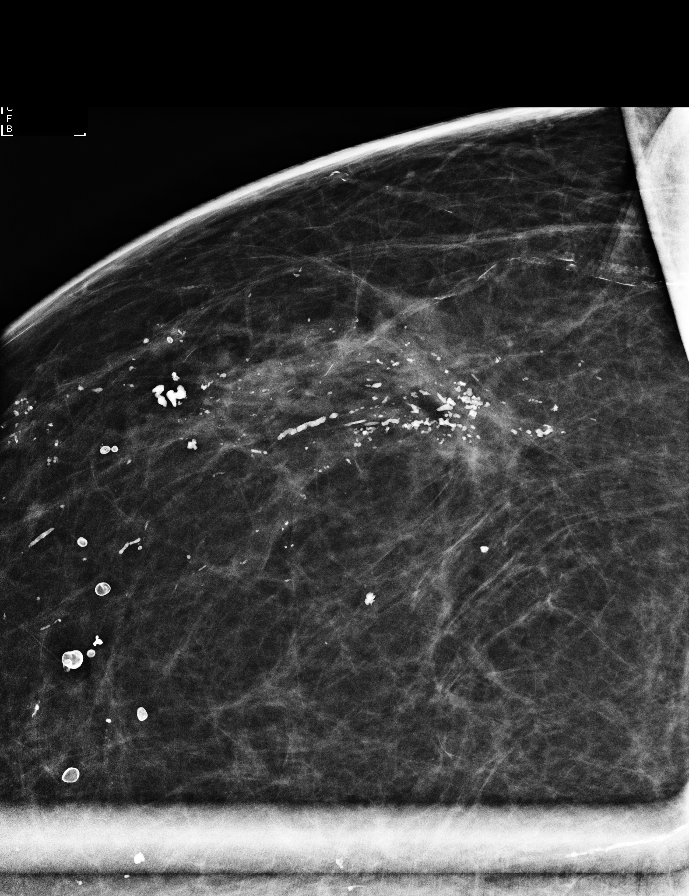

[R CC (2 of 2)]
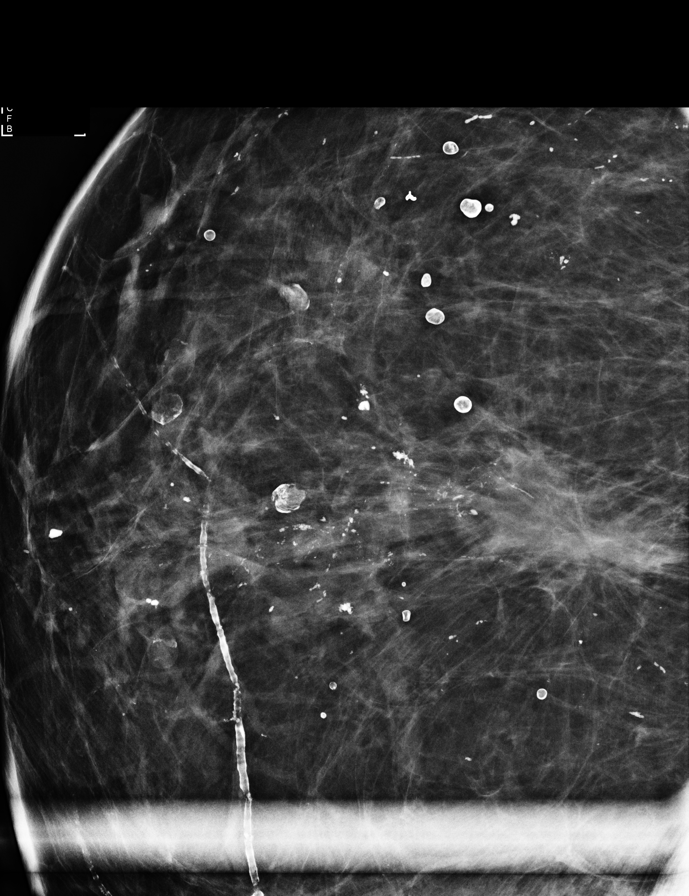

[R ML (1 of 2)]
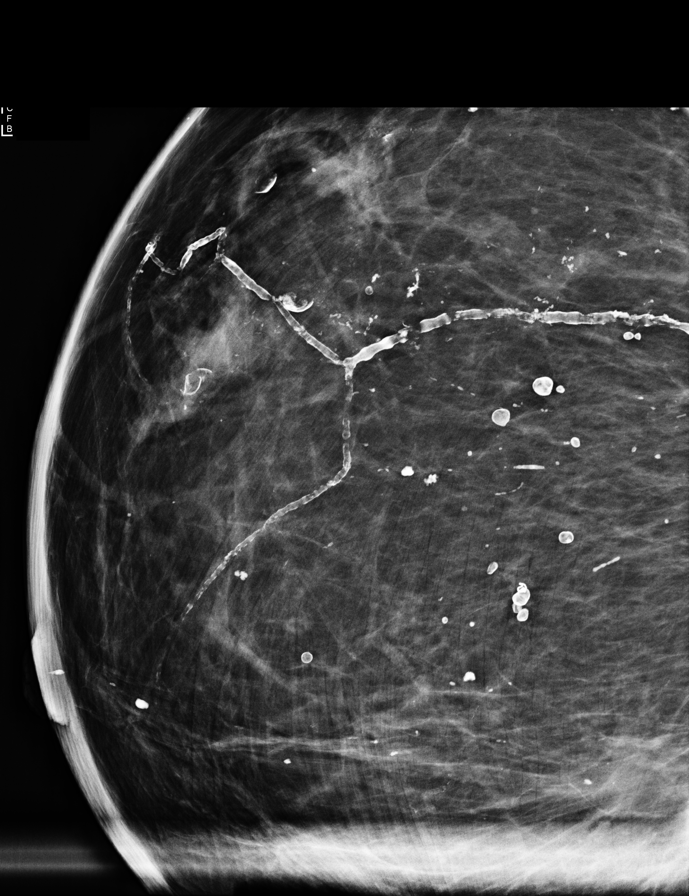

[R ML (2 of 2)]
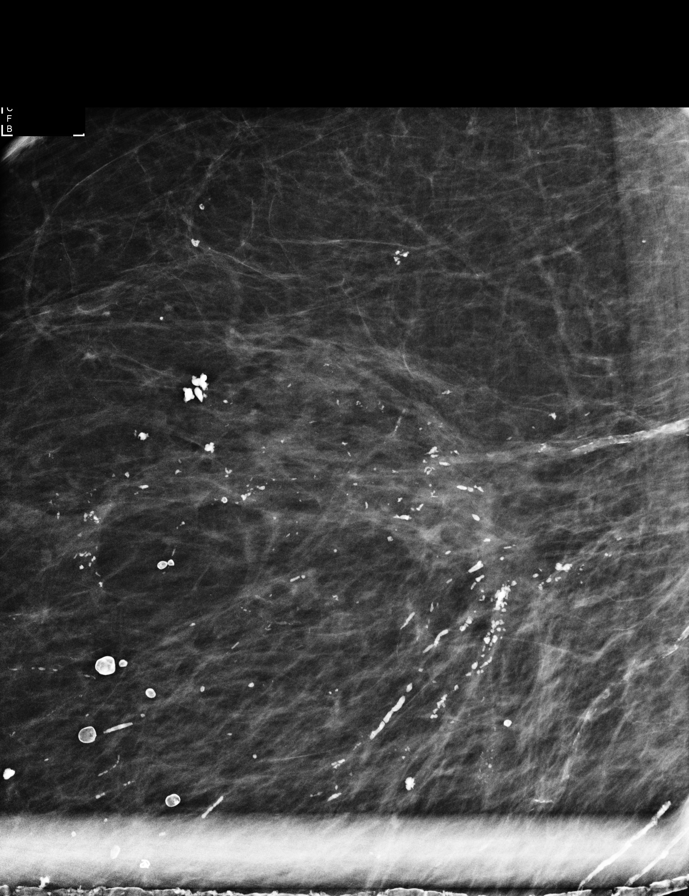

[L MLO synth-2D]
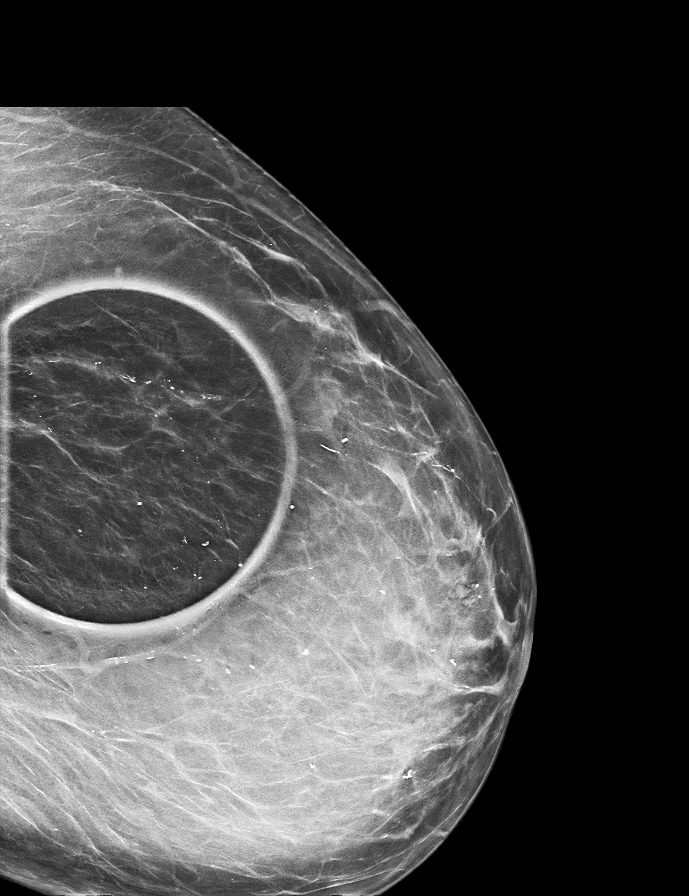

[L CC synth-2D]
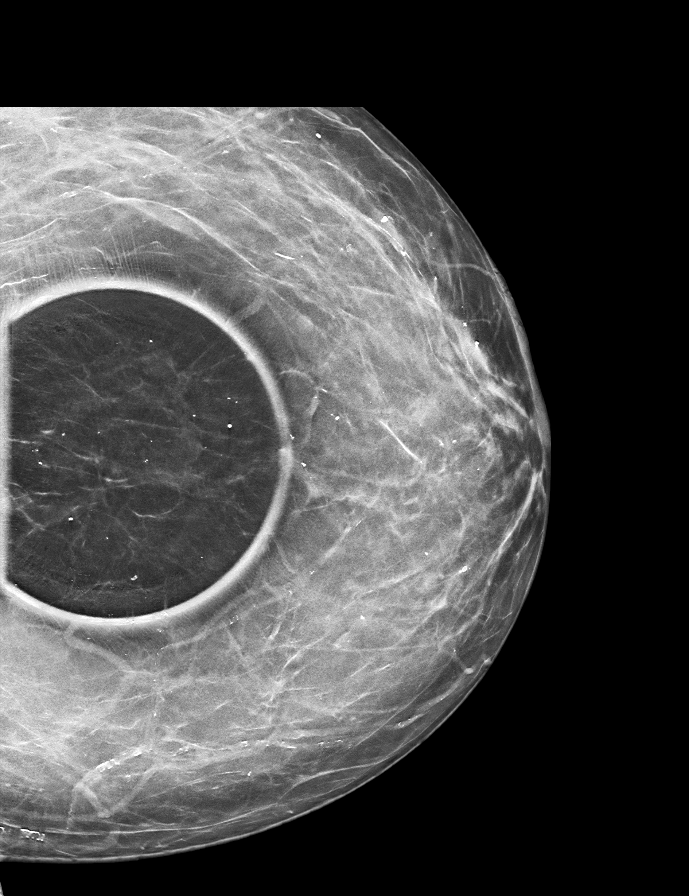

[R CC synth-2D]
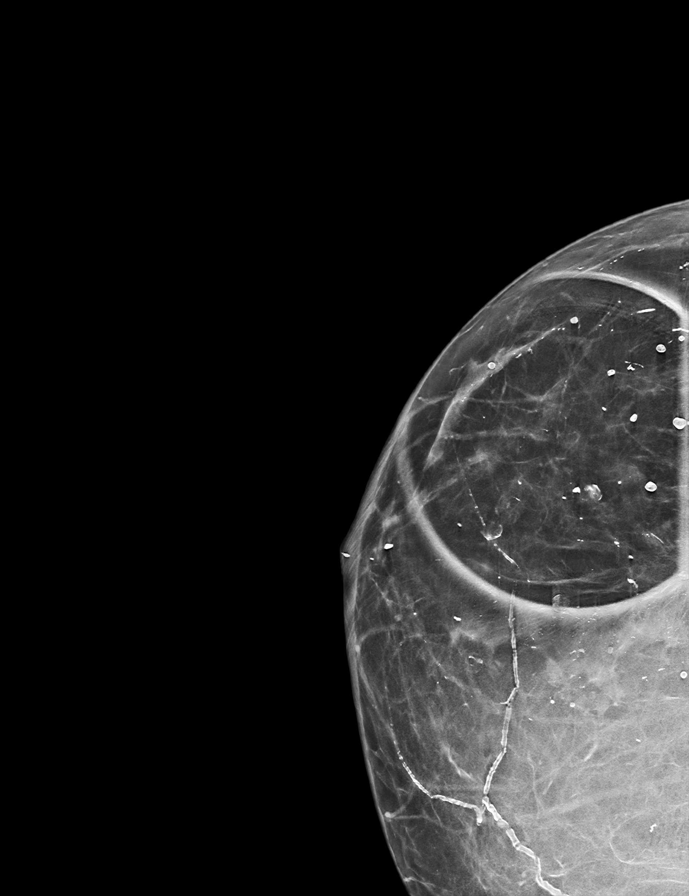

[R ML synth-2D]
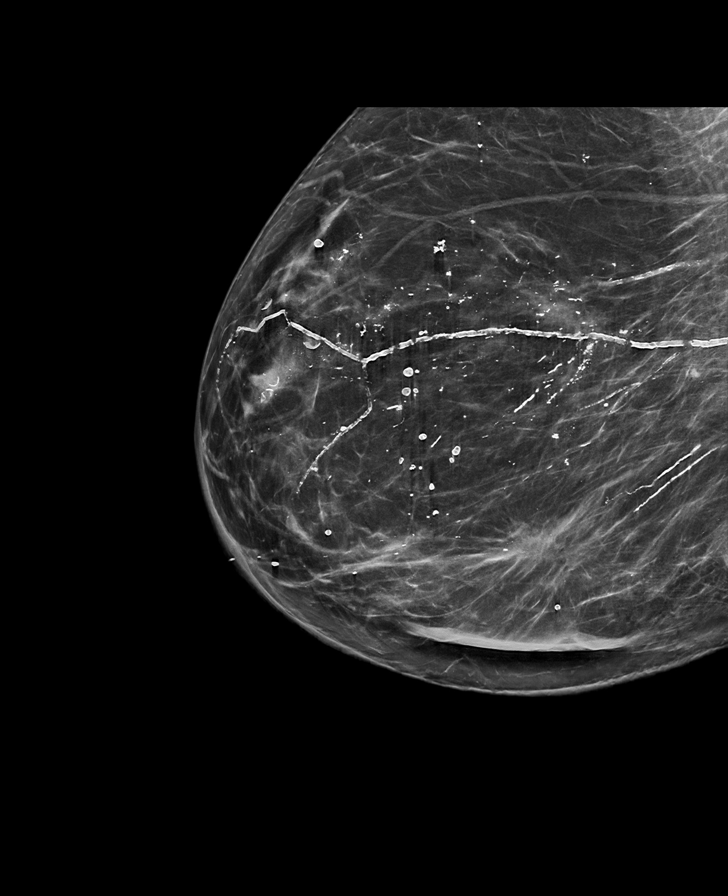

[8 of 28 positions shown; findings below may reference images not displayed]

ACR Breast Density Category b: There are scattered areas of
fibroglandular density.
FINDINGS: On today's additional diagnostic views, including spot compression
with 3D tomosynthesis, there is no persistent asymmetry within
either breast indicating superimposition of normal fibroglandular
tissues.

There are stable postsurgical changes within the lower central RIGHT
breast.

However, there are pleomorphic and coarse heterogeneous
calcifications within the upper-outer quadrant of the RIGHT breast,
anterior to posterior depth, with a suspicious regional distribution
and measuring approximately 13 cm extent.
IMPRESSION: 1. Pleomorphic and coarse heterogeneous calcifications within the
upper-outer quadrant of the RIGHT breast, with a suspicious regional
distribution, anterior to posterior depth, measuring approximately
13 cm extent. Recommend sampling of the calcifications at anterior
depth and at posterior depth (2 sites), targets best seen on the CC
views.
2. Stable postsurgical changes within the lower central RIGHT
breast, corresponding to an earlier lumpectomy site for breast
cancer.

RECOMMENDATION:
Stereotactic biopsies of the suspicious calcifications within the
upper-outer quadrant of the RIGHT breast. Recommend sampling of the
calcifications at anterior depth and at posterior depth (2 sites),
to include the pleomorphic calcifications at anterior depth and the
coarse heterogeneous calcifications and possibly casting
calcifications at posterior depth, biopsy targets best visualized on
the annotated CC views of today's diagnostic exam and screening
mammogram of 11/11/2021.

Stereotactic biopsy is scheduled for [REDACTED].

I have discussed the findings and recommendations with the patient.
If applicable, a reminder letter will be sent to the patient
regarding the next appointment.

BI-RADS CATEGORY  4: Suspicious.

## 2023-04-02 ENCOUNTER — Other Ambulatory Visit: Payer: Self-pay

## 2023-04-03 ENCOUNTER — Encounter: Payer: Self-pay | Admitting: Hematology & Oncology

## 2023-04-04 ENCOUNTER — Other Ambulatory Visit: Payer: Self-pay

## 2023-04-06 ENCOUNTER — Telehealth: Payer: Self-pay

## 2023-04-06 ENCOUNTER — Inpatient Hospital Stay: Payer: Medicare Other

## 2023-04-06 VITALS — BP 146/73 | HR 69 | Temp 98.1°F

## 2023-04-06 VITALS — Resp 17

## 2023-04-06 DIAGNOSIS — C50911 Malignant neoplasm of unspecified site of right female breast: Secondary | ICD-10-CM

## 2023-04-06 LAB — CBC WITH DIFFERENTIAL (CANCER CENTER ONLY)
Abs Immature Granulocytes: 0.02 10*3/uL (ref 0.00–0.07)
Basophils Absolute: 0 10*3/uL (ref 0.0–0.1)
Basophils Relative: 1 %
Eosinophils Absolute: 0.1 10*3/uL (ref 0.0–0.5)
Eosinophils Relative: 1 %
HCT: 31.8 % — ABNORMAL LOW (ref 36.0–46.0)
Hemoglobin: 10.7 g/dL — ABNORMAL LOW (ref 12.0–15.0)
Immature Granulocytes: 0 %
Lymphocytes Relative: 25 %
Lymphs Abs: 2.2 10*3/uL (ref 0.7–4.0)
MCH: 30.2 pg (ref 26.0–34.0)
MCHC: 33.6 g/dL (ref 30.0–36.0)
MCV: 89.8 fL (ref 80.0–100.0)
Monocytes Absolute: 0.7 10*3/uL (ref 0.1–1.0)
Monocytes Relative: 8 %
Neutro Abs: 5.6 10*3/uL (ref 1.7–7.7)
Neutrophils Relative %: 65 %
Platelet Count: 236 10*3/uL (ref 150–400)
RBC: 3.54 MIL/uL — ABNORMAL LOW (ref 3.87–5.11)
RDW: 13.2 % (ref 11.5–15.5)
WBC Count: 8.6 10*3/uL (ref 4.0–10.5)
nRBC: 0 % (ref 0.0–0.2)

## 2023-04-06 LAB — CMP (CANCER CENTER ONLY)
ALT: 19 U/L (ref 0–44)
AST: 29 U/L (ref 15–41)
Albumin: 3.5 g/dL (ref 3.5–5.0)
Alkaline Phosphatase: 52 U/L (ref 38–126)
Anion gap: 7 (ref 5–15)
BUN: 20 mg/dL (ref 8–23)
CO2: 30 mmol/L (ref 22–32)
Calcium: 9 mg/dL (ref 8.9–10.3)
Chloride: 98 mmol/L (ref 98–111)
Creatinine: 1.15 mg/dL — ABNORMAL HIGH (ref 0.44–1.00)
GFR, Estimated: 52 mL/min — ABNORMAL LOW (ref >60)
Glucose, Bld: 104 mg/dL — ABNORMAL HIGH (ref 70–99)
Potassium: 3.5 mmol/L (ref 3.5–5.1)
Sodium: 135 mmol/L (ref 135–145)
Total Bilirubin: 0.3 mg/dL (ref 0.3–1.2)
Total Protein: 7.1 g/dL (ref 6.5–8.1)

## 2023-04-06 MED ORDER — DIPHENHYDRAMINE HCL 25 MG PO CAPS
50.0000 mg | ORAL_CAPSULE | Freq: Once | ORAL | Status: AC
  Administered 2023-04-06: 50 mg via ORAL
  Filled 2023-04-06: qty 2

## 2023-04-06 MED ORDER — SODIUM CHLORIDE 0.9% FLUSH
10.0000 mL | INTRAVENOUS | Status: DC | PRN
  Administered 2023-04-06: 10 mL

## 2023-04-06 MED ORDER — HEPARIN SOD (PORK) LOCK FLUSH 100 UNIT/ML IV SOLN
500.0000 [IU] | Freq: Once | INTRAVENOUS | Status: AC | PRN
  Administered 2023-04-06: 500 [IU]

## 2023-04-06 MED ORDER — SODIUM CHLORIDE 0.9 % IV SOLN
3.6000 mg/kg | Freq: Once | INTRAVENOUS | Status: AC
  Administered 2023-04-06: 320 mg via INTRAVENOUS
  Filled 2023-04-06: qty 16

## 2023-04-06 MED ORDER — ACETAMINOPHEN 325 MG PO TABS
650.0000 mg | ORAL_TABLET | Freq: Once | ORAL | Status: AC
  Administered 2023-04-06: 650 mg via ORAL
  Filled 2023-04-06: qty 2

## 2023-04-06 MED ORDER — PROCHLORPERAZINE MALEATE 10 MG PO TABS
10.0000 mg | ORAL_TABLET | Freq: Once | ORAL | Status: AC
  Administered 2023-04-06: 10 mg via ORAL
  Filled 2023-04-06: qty 1

## 2023-04-06 MED ORDER — SODIUM CHLORIDE 0.9% FLUSH
10.0000 mL | Freq: Once | INTRAVENOUS | Status: AC
  Administered 2023-04-06: 10 mL

## 2023-04-06 MED ORDER — SODIUM CHLORIDE 0.9 % IV SOLN: Freq: Once | INTRAVENOUS | Status: AC

## 2023-04-06 NOTE — Telephone Encounter (Signed)
-----   Message from Josph Macho, MD sent at 04/06/2023 11:25 AM EDT ----- Please call and let her know that the sodium is back up to normal now.  Fantastic job.  Keep up with the Gatorade or Pedialyte at home.  Cindee Lame

## 2023-04-06 NOTE — Patient Instructions (Signed)

## 2023-04-06 NOTE — Telephone Encounter (Signed)
Attached message from Dr Myna Hidalgo given to pt during today's infusion visit. dph

## 2023-04-06 NOTE — Patient Instructions (Signed)
[facility]  Discharge Instructions: Thank you for choosing [facility] to provide your oncology and hematology care.   If you have a lab appointment with the Cancer Center, please go directly to the Cancer Center and check in at the registration area.  Wear comfortable clothing and clothing appropriate for easy access to any Portacath or PICC line.   We strive to give you quality time with your provider. You may need to reschedule your appointment if you arrive late (15 or more minutes).  Arriving late affects you and other patients whose appointments are after yours.  Also, if you miss three or more appointments without notifying the office, you may be dismissed from the clinic at the provider's discretion.      For prescription refill requests, have your pharmacy contact our office and allow 72 hours for refills to be completed.    Today you received the following chemotherapy and/or immunotherapy agents Kadcyla      To help prevent nausea and vomiting after your treatment, we encourage you to take your nausea medication as directed.  BELOW ARE SYMPTOMS THAT SHOULD BE REPORTED IMMEDIATELY: *FEVER GREATER THAN 100.4 F (38 C) OR HIGHER *CHILLS OR SWEATING *NAUSEA AND VOMITING THAT IS NOT CONTROLLED WITH YOUR NAUSEA MEDICATION *UNUSUAL SHORTNESS OF BREATH *UNUSUAL BRUISING OR BLEEDING *URINARY PROBLEMS (pain or burning when urinating, or frequent urination) *BOWEL PROBLEMS (unusual diarrhea, constipation, pain near the anus) TENDERNESS IN MOUTH AND THROAT WITH OR WITHOUT PRESENCE OF ULCERS (sore throat, sores in mouth, or a toothache) UNUSUAL RASH, SWELLING OR PAIN  UNUSUAL VAGINAL DISCHARGE OR ITCHING   Items with * indicate a potential emergency and should be followed up as soon as possible or go to the Emergency Department if any problems should occur.  Please show the CHEMOTHERAPY ALERT CARD or IMMUNOTHERAPY ALERT CARD at check-in  to the Emergency Department and triage nurse. Should you have questions after your visit or need to cancel or reschedule your appointment, please contact [facility]  [phone] and follow the prompts.  Office hours are 8:00 a.m. to 4:30 p.m. Monday - Friday. Please note that voicemails left after 4:00 p.m. may not be returned until the following business day.  We are closed weekends and major holidays. You have access to a nurse at all times for urgent questions. Please call the main number to the clinic [phone] and follow the prompts.  For any non-urgent questions, you may also contact your provider using MyChart. We now offer e-Visits for anyone 18 and older to request care online for non-urgent symptoms. For details visit [email or web address].   Also download the MyChart app! Go to the app store, search "MyChart", open the app, select [facility], and log in with your MyChart username and password.   

## 2023-04-19 ENCOUNTER — Other Ambulatory Visit: Payer: Medicare Other

## 2023-04-19 ENCOUNTER — Ambulatory Visit: Payer: Medicare Other | Admitting: Hematology & Oncology

## 2023-04-19 ENCOUNTER — Inpatient Hospital Stay: Payer: Medicare Other

## 2023-04-19 ENCOUNTER — Ambulatory Visit: Payer: Medicare Other

## 2023-04-25 ENCOUNTER — Inpatient Hospital Stay: Payer: Medicare Other

## 2023-04-25 ENCOUNTER — Telehealth: Payer: Self-pay | Admitting: *Deleted

## 2023-04-25 ENCOUNTER — Other Ambulatory Visit: Payer: Self-pay

## 2023-04-25 ENCOUNTER — Inpatient Hospital Stay (HOSPITAL_BASED_OUTPATIENT_CLINIC_OR_DEPARTMENT_OTHER): Payer: Medicare Other | Admitting: Hematology & Oncology

## 2023-04-25 ENCOUNTER — Encounter: Payer: Self-pay | Admitting: Hematology & Oncology

## 2023-04-25 ENCOUNTER — Encounter: Payer: Self-pay | Admitting: *Deleted

## 2023-04-25 VITALS — BP 155/55 | HR 66 | Temp 97.9°F | Resp 18 | Ht 66.0 in | Wt 187.0 lb

## 2023-04-25 DIAGNOSIS — C50911 Malignant neoplasm of unspecified site of right female breast: Secondary | ICD-10-CM

## 2023-04-25 DIAGNOSIS — Z171 Estrogen receptor negative status [ER-]: Secondary | ICD-10-CM

## 2023-04-25 LAB — CMP (CANCER CENTER ONLY)
ALT: 16 U/L (ref 0–44)
AST: 26 U/L (ref 15–41)
Albumin: 3.4 g/dL — ABNORMAL LOW (ref 3.5–5.0)
Alkaline Phosphatase: 63 U/L (ref 38–126)
Anion gap: 11 (ref 5–15)
BUN: 21 mg/dL (ref 8–23)
CO2: 28 mmol/L (ref 22–32)
Calcium: 8.9 mg/dL (ref 8.9–10.3)
Chloride: 95 mmol/L — ABNORMAL LOW (ref 98–111)
Creatinine: 1.28 mg/dL — ABNORMAL HIGH (ref 0.44–1.00)
GFR, Estimated: 45 mL/min — ABNORMAL LOW (ref >60)
Glucose, Bld: 113 mg/dL — ABNORMAL HIGH (ref 70–99)
Potassium: 3.5 mmol/L (ref 3.5–5.1)
Sodium: 134 mmol/L — ABNORMAL LOW (ref 135–145)
Total Bilirubin: 0.3 mg/dL (ref 0.3–1.2)
Total Protein: 6.8 g/dL (ref 6.5–8.1)

## 2023-04-25 LAB — CBC WITH DIFFERENTIAL (CANCER CENTER ONLY)
Abs Immature Granulocytes: 0.03 10*3/uL (ref 0.00–0.07)
Basophils Absolute: 0 10*3/uL (ref 0.0–0.1)
Basophils Relative: 0 %
Eosinophils Absolute: 0.1 10*3/uL (ref 0.0–0.5)
Eosinophils Relative: 1 %
HCT: 30.4 % — ABNORMAL LOW (ref 36.0–46.0)
Hemoglobin: 10.4 g/dL — ABNORMAL LOW (ref 12.0–15.0)
Immature Granulocytes: 0 %
Lymphocytes Relative: 17 %
Lymphs Abs: 1.8 10*3/uL (ref 0.7–4.0)
MCH: 30.3 pg (ref 26.0–34.0)
MCHC: 34.2 g/dL (ref 30.0–36.0)
MCV: 88.6 fL (ref 80.0–100.0)
Monocytes Absolute: 0.8 10*3/uL (ref 0.1–1.0)
Monocytes Relative: 8 %
Neutro Abs: 7.9 10*3/uL — ABNORMAL HIGH (ref 1.7–7.7)
Neutrophils Relative %: 74 %
Platelet Count: 266 10*3/uL (ref 150–400)
RBC: 3.43 MIL/uL — ABNORMAL LOW (ref 3.87–5.11)
RDW: 12.6 % (ref 11.5–15.5)
WBC Count: 10.6 10*3/uL — ABNORMAL HIGH (ref 4.0–10.5)
nRBC: 0 % (ref 0.0–0.2)

## 2023-04-25 LAB — LACTATE DEHYDROGENASE: LDH: 249 U/L — ABNORMAL HIGH (ref 98–192)

## 2023-04-25 MED ORDER — ACETAMINOPHEN 325 MG PO TABS
650.0000 mg | ORAL_TABLET | Freq: Once | ORAL | Status: AC
  Administered 2023-04-25: 650 mg via ORAL
  Filled 2023-04-25: qty 2

## 2023-04-25 MED ORDER — SODIUM CHLORIDE 0.9% FLUSH
10.0000 mL | INTRAVENOUS | Status: DC | PRN
  Administered 2023-04-25: 10 mL

## 2023-04-25 MED ORDER — SODIUM CHLORIDE 0.9 % IV SOLN: Freq: Once | INTRAVENOUS | Status: AC

## 2023-04-25 MED ORDER — HEPARIN SOD (PORK) LOCK FLUSH 100 UNIT/ML IV SOLN
500.0000 [IU] | Freq: Once | INTRAVENOUS | Status: AC | PRN
  Administered 2023-04-25: 500 [IU]

## 2023-04-25 MED ORDER — DIPHENHYDRAMINE HCL 25 MG PO CAPS
50.0000 mg | ORAL_CAPSULE | Freq: Once | ORAL | Status: AC
  Administered 2023-04-25: 50 mg via ORAL
  Filled 2023-04-25: qty 2

## 2023-04-25 MED ORDER — PROCHLORPERAZINE EDISYLATE 10 MG/2ML IJ SOLN: 10.0000 mg | Freq: Once | INTRAMUSCULAR | Status: DC

## 2023-04-25 MED ORDER — PROCHLORPERAZINE MALEATE 10 MG PO TABS
10.0000 mg | ORAL_TABLET | Freq: Once | ORAL | Status: AC
  Administered 2023-04-25: 10 mg via ORAL
  Filled 2023-04-25: qty 1

## 2023-04-25 MED ORDER — SODIUM CHLORIDE 0.9 % IV SOLN
3.6000 mg/kg | Freq: Once | INTRAVENOUS | Status: AC
  Administered 2023-04-25: 320 mg via INTRAVENOUS
  Filled 2023-04-25: qty 16

## 2023-04-25 NOTE — Progress Notes (Unsigned)
Patient cleared to continue her Kadcyla. This will continue until June.   Oncology Nurse Navigator Documentation     04/25/2023   12:30 PM  Oncology Nurse Navigator Flowsheets  Navigator Follow Up Date: 05/16/2023  Navigator Follow Up Reason: Follow-up Appointment;Change in Treatment  Navigator Location [facility]  Navigator Encounter Type Appt/Treatment Plan Review  Patient Visit Type MedOnc  Treatment Phase Active Tx  Barriers/Navigation Needs No Barriers At This Time  Interventions None Required  Acuity Level 1-No Barriers  Support Groups/Services Friends and Family  Time Spent with Patient 15

## 2023-04-25 NOTE — Progress Notes (Signed)
Hematology and Oncology Follow Up Visit  Brandi Roberts [id number] Apr 05, 1956 67 y.o. 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  When we last saw her, we held her Kadcyla because her sodium was still low.  I am not sure as to why her sodium was quite low.  We repeated this a week later and her sodium is back to normal..  She feels well.  She does have little bit of a ear infection and sinus infection.  She saw her family doctor yesterday.  He gave her an injection.  Sounds like there may have been Solu-Medrol.  He also put her on a Z-Pak.  She is feeling better.  She has had no problems with nausea or vomiting.  There is been no change in bowel or bladder habits.  She has had no cough or shortness of breath.  She has had no rashes.  She has had no bleeding.  Overall, I would say that her performance status is probably ECOG 1.     Medications:  Current Outpatient Medications:    albuterol (VENTOLIN HFA) 108 (90 Base) MCG/ACT inhaler, Inhale 2 puffs into the lungs every 6 (six) hours as needed., Disp: , Rfl:    ALPRAZolam (XANAX) 0.5 MG tablet, Take 0.5 mg by mouth 2 (two) times daily as needed., Disp: , Rfl:    amLODipine (NORVASC) 10 MG tablet, Take 10 mg by mouth daily., Disp: , Rfl:    atorvastatin (LIPITOR) 10 MG tablet, Take 5 mg by mouth at bedtime., Disp: , Rfl:    azithromycin (ZITHROMAX) 250 MG tablet, Take 250 mg by mouth as directed., Disp: , Rfl:    benazepril (LOTENSIN) 40 MG tablet, Take 40 mg by mouth daily., Disp: , Rfl:    doxazosin (CARDURA) 2 MG tablet, Take 2 mg by mouth 2 (two) times daily., Disp: , Rfl:    ELIQUIS 5 MG TABS tablet, Take 5 mg by mouth 2 (two) times daily., Disp: , Rfl:    Ferrous Sulfate (IRON)  325 (65 Fe) MG TABS, Take 1 tablet by mouth daily., Disp: , Rfl:    gabapentin (NEURONTIN) 600 MG tablet, Take 600 mg by mouth at bedtime., Disp: , Rfl:    guaiFENesin-codeine 100-10 MG/5ML syrup, Take 10 mLs by mouth every 6 (six) hours as needed., Disp: , Rfl:    hydrALAZINE (APRESOLINE) 50 MG tablet, Take 50 mg by mouth 2 (two) times daily., Disp: , Rfl:    HYDROcodone-acetaminophen (NORCO/VICODIN) 5-325 MG tablet, Take 2 tablets by mouth 2 (two) times daily., Disp: , Rfl:    metFORMIN (GLUCOPHAGE) 1000 MG tablet, Take 1,000 mg by mouth 2 (two) times daily., Disp: , Rfl:    metoprolol succinate (TOPROL-XL) 100 MG 24 hr tablet, Take 100 mg by mouth daily., Disp: , Rfl:    sertraline (ZOLOFT) 25 MG tablet, Take 1 tablet (25 mg total) by mouth daily., Disp: 30 tablet, Rfl: 3   triamterene-hydrochlorothiazide (MAXZIDE) 75-50 MG per tablet, Take 1 tablet by mouth daily., Disp: , Rfl:    Vitamin D, Ergocalciferol, (DRISDOL) 1.25 MG (50000 UNIT) CAPS capsule, Take 50,000 Units by mouth once a week., Disp: , Rfl:    ondansetron (ZOFRAN-ODT) 4 MG disintegrating tablet, Take 1 tablet (4  mg total) by mouth every 8 (eight) hours as needed for nausea or vomiting. (Patient not taking: Reported on 03/06/2023), Disp: 20 tablet, Rfl: 0 No current facility-administered medications for this visit.  Facility-Administered Medications Ordered in Other Visits:    0.9 %  sodium chloride infusion, , Intravenous, Once, Michelle Vanhise, Rose Phi, MD   acetaminophen (TYLENOL) tablet 650 mg, 650 mg, Oral, Once, Hortensia Duffin, Rose Phi, MD   ado-trastuzumab emtansine (KADCYLA) 320 mg in sodium chloride 0.9 % 250 mL chemo infusion, 3.6 mg/kg (Treatment Plan Recorded), Intravenous, Once, Enriqueta Augusta, Rose Phi, MD   diphenhydrAMINE (BENADRYL) 25 mg capsule, , , ,    diphenhydrAMINE (BENADRYL) capsule 50 mg, 50 mg, Oral, Once, Jessenya Berdan, Rose Phi, MD   heparin lock flush 100 unit/mL, 500 Units, Intracatheter, Once PRN, Josph Macho, MD    prochlorperazine (COMPAZINE) injection 10 mg, 10 mg, Intravenous, Once, Arleigh Dicola, Rose Phi, MD   sodium chloride flush (NS) 0.9 % injection 10 mL, 10 mL, Intracatheter, PRN, Josph Macho, MD  Allergies:  Allergies  Allergen Reactions   Levaquin [Levofloxacin] Nausea And Vomiting and Other (See Comments)    dizziness    Past Medical History, Surgical history, Social history, and Family History were reviewed and updated.  Review of Systems: Review of Systems  Constitutional: Negative.   HENT:  Negative.    Eyes: Negative.   Respiratory: Negative.    Cardiovascular: Negative.   Gastrointestinal: Negative.   Endocrine: Negative.   Genitourinary: Negative.    Musculoskeletal: Negative.   Skin: Negative.   Neurological: Negative.   Hematological: Negative.   Psychiatric/Behavioral: Negative.      Physical Exam:  height is 5\' 6"  (1.676 m) and weight is 187 lb (84.8 kg). Her oral temperature is 97.9 F (36.6 C). Her blood pressure is 155/55 (abnormal) and her pulse is 66. Her respiration is 18 and oxygen saturation is 99%.   Wt Readings from Last 3 Encounters:  04/25/23 187 lb (84.8 kg)  03/30/23 188 lb (85.3 kg)  03/06/23 190 lb 12.8 oz (86.5 kg)    Physical Exam Vitals reviewed.  Constitutional:      Comments: Chest wall exam shows right mastectomy.  This is healing very nicely.  She has no erythema or nodularity.  There is no right axillary adenopathy.  Left breast shows no masses, edema or erythema.  She has had no nodularity or inflammation or swelling.  There is no erythema.  There is no left axillary adenopathy.  HENT:     Head: Normocephalic and atraumatic.  Eyes:     Pupils: Pupils are equal, round, and reactive to light.  Cardiovascular:     Rate and Rhythm: Normal rate and regular rhythm.     Heart sounds: Normal heart sounds.  Pulmonary:     Effort: Pulmonary effort is normal.     Breath sounds: Normal breath sounds.  Abdominal:     General: Bowel sounds  are normal.     Palpations: Abdomen is soft.  Musculoskeletal:        General: No tenderness or deformity. Normal range of motion.     Cervical back: Normal range of motion.  Lymphadenopathy:     Cervical: No cervical adenopathy.  Skin:    General: Skin is warm and dry.     Findings: No erythema or rash.  Neurological:     Mental Status: She is alert and oriented to person, place, and time.  Psychiatric:        Behavior: Behavior normal.  Thought Content: Thought content normal.        Judgment: Judgment normal.      Lab Results  Component Value Date   WBC 10.6 (H) 04/25/2023   HGB 10.4 (L) 04/25/2023   HCT 30.4 (L) 04/25/2023   MCV 88.6 04/25/2023   PLT 266 04/25/2023     Chemistry      Component Value Date/Time   NA 134 (L) 04/25/2023 1120   K 3.5 04/25/2023 1120   CL 95 (L) 04/25/2023 1120   CO2 28 04/25/2023 1120   BUN 21 04/25/2023 1120   CREATININE 1.28 (H) 04/25/2023 1120      Component Value Date/Time   CALCIUM 8.9 04/25/2023 1120   ALKPHOS 63 04/25/2023 1120   AST 26 04/25/2023 1120   ALT 16 04/25/2023 1120   BILITOT 0.3 04/25/2023 1120      Impression and Plan: Ms. Herbig is a very charming 67 year old postmenopausal Afro-American female.  She has a invasive ductal carcinoma of the right breast.  She underwent a mastectomy.  She had 1 lymph node positive.  The tumor is ER -/PR-.  Unfortunately, her breast cancer is HER2 positive.  We have her on adjuvant therapy with Kadcyla.  She did not wish to have any chemotherapy.  However, she would take Kadcyla.  We will go ahead with Kadcyla.  I think this will be her 15th cycle.  She will finish out in June.  I am just excited about her being able to finish out.  She has done incredibly well.  We will plan to see her back in another 3 weeks.  Josph Macho, MD 4/30/202412:26 PM

## 2023-04-25 NOTE — Patient Instructions (Signed)

## 2023-04-25 NOTE — Patient Instructions (Signed)
[facility]  Discharge Instructions: Thank you for choosing [facility] to provide your oncology and hematology care.   If you have a lab appointment with the Cancer Center, please go directly to the Cancer Center and check in at the registration area.  Wear comfortable clothing and clothing appropriate for easy access to any Portacath or PICC line.   We strive to give you quality time with your provider. You may need to reschedule your appointment if you arrive late (15 or more minutes).  Arriving late affects you and other patients whose appointments are after yours.  Also, if you miss three or more appointments without notifying the office, you may be dismissed from the clinic at the provider's discretion.      For prescription refill requests, have your pharmacy contact our office and allow 72 hours for refills to be completed.    Today you received the following chemotherapy and/or immunotherapy agents Kadcyla      To help prevent nausea and vomiting after your treatment, we encourage you to take your nausea medication as directed.  BELOW ARE SYMPTOMS THAT SHOULD BE REPORTED IMMEDIATELY: *FEVER GREATER THAN 100.4 F (38 C) OR HIGHER *CHILLS OR SWEATING *NAUSEA AND VOMITING THAT IS NOT CONTROLLED WITH YOUR NAUSEA MEDICATION *UNUSUAL SHORTNESS OF BREATH *UNUSUAL BRUISING OR BLEEDING *URINARY PROBLEMS (pain or burning when urinating, or frequent urination) *BOWEL PROBLEMS (unusual diarrhea, constipation, pain near the anus) TENDERNESS IN MOUTH AND THROAT WITH OR WITHOUT PRESENCE OF ULCERS (sore throat, sores in mouth, or a toothache) UNUSUAL RASH, SWELLING OR PAIN  UNUSUAL VAGINAL DISCHARGE OR ITCHING   Items with * indicate a potential emergency and should be followed up as soon as possible or go to the Emergency Department if any problems should occur.  Please show the CHEMOTHERAPY ALERT CARD or IMMUNOTHERAPY ALERT CARD at check-in  to the Emergency Department and triage nurse. Should you have questions after your visit or need to cancel or reschedule your appointment, please contact [facility]  [phone] and follow the prompts.  Office hours are 8:00 a.m. to 4:30 p.m. Monday - Friday. Please note that voicemails left after 4:00 p.m. may not be returned until the following business day.  We are closed weekends and major holidays. You have access to a nurse at all times for urgent questions. Please call the main number to the clinic [phone] and follow the prompts.  For any non-urgent questions, you may also contact your provider using MyChart. We now offer e-Visits for anyone 18 and older to request care online for non-urgent symptoms. For details visit [email or web address].   Also download the MyChart app! Go to the app store, search "MyChart", open the app, select [facility], and log in with your MyChart username and password.   

## 2023-04-26 ENCOUNTER — Other Ambulatory Visit: Payer: Self-pay

## 2023-04-26 ENCOUNTER — Encounter: Payer: Self-pay | Admitting: Hematology & Oncology

## 2023-04-30 ENCOUNTER — Other Ambulatory Visit: Payer: Self-pay

## 2023-05-16 ENCOUNTER — Inpatient Hospital Stay: Payer: Medicare Other

## 2023-05-16 ENCOUNTER — Inpatient Hospital Stay: Payer: Medicare Other | Attending: Hematology & Oncology

## 2023-05-16 ENCOUNTER — Encounter: Payer: Self-pay | Admitting: *Deleted

## 2023-05-16 ENCOUNTER — Inpatient Hospital Stay (HOSPITAL_BASED_OUTPATIENT_CLINIC_OR_DEPARTMENT_OTHER): Payer: Medicare Other | Admitting: Family

## 2023-05-16 ENCOUNTER — Encounter: Payer: Self-pay | Admitting: Family

## 2023-05-16 VITALS — BP 138/58 | HR 61 | Temp 98.3°F | Resp 20 | Wt 187.8 lb

## 2023-05-16 VITALS — BP 141/57 | HR 65 | Resp 17

## 2023-05-16 DIAGNOSIS — C50911 Malignant neoplasm of unspecified site of right female breast: Secondary | ICD-10-CM

## 2023-05-16 DIAGNOSIS — Z5112 Encounter for antineoplastic immunotherapy: Secondary | ICD-10-CM | POA: Insufficient documentation

## 2023-05-16 DIAGNOSIS — Z171 Estrogen receptor negative status [ER-]: Secondary | ICD-10-CM | POA: Diagnosis not present

## 2023-05-16 DIAGNOSIS — Z7984 Long term (current) use of oral hypoglycemic drugs: Secondary | ICD-10-CM | POA: Insufficient documentation

## 2023-05-16 DIAGNOSIS — Z9011 Acquired absence of right breast and nipple: Secondary | ICD-10-CM | POA: Insufficient documentation

## 2023-05-16 DIAGNOSIS — Z79899 Other long term (current) drug therapy: Secondary | ICD-10-CM | POA: Diagnosis not present

## 2023-05-16 DIAGNOSIS — E871 Hypo-osmolality and hyponatremia: Secondary | ICD-10-CM

## 2023-05-16 LAB — CBC WITH DIFFERENTIAL (CANCER CENTER ONLY)
Abs Immature Granulocytes: 0.02 10*3/uL (ref 0.00–0.07)
Basophils Absolute: 0 10*3/uL (ref 0.0–0.1)
Basophils Relative: 0 %
Eosinophils Absolute: 0.1 10*3/uL (ref 0.0–0.5)
Eosinophils Relative: 1 %
HCT: 30.5 % — ABNORMAL LOW (ref 36.0–46.0)
Hemoglobin: 10.5 g/dL — ABNORMAL LOW (ref 12.0–15.0)
Immature Granulocytes: 0 %
Lymphocytes Relative: 22 %
Lymphs Abs: 1.9 10*3/uL (ref 0.7–4.0)
MCH: 30.3 pg (ref 26.0–34.0)
MCHC: 34.4 g/dL (ref 30.0–36.0)
MCV: 88.2 fL (ref 80.0–100.0)
Monocytes Absolute: 0.8 10*3/uL (ref 0.1–1.0)
Monocytes Relative: 9 %
Neutro Abs: 6 10*3/uL (ref 1.7–7.7)
Neutrophils Relative %: 68 %
Platelet Count: 190 10*3/uL (ref 150–400)
RBC: 3.46 MIL/uL — ABNORMAL LOW (ref 3.87–5.11)
RDW: 12.8 % (ref 11.5–15.5)
WBC Count: 8.9 10*3/uL (ref 4.0–10.5)
nRBC: 0 % (ref 0.0–0.2)

## 2023-05-16 LAB — CMP (CANCER CENTER ONLY)
ALT: 23 U/L (ref 0–44)
AST: 47 U/L — ABNORMAL HIGH (ref 15–41)
Albumin: 3.1 g/dL — ABNORMAL LOW (ref 3.5–5.0)
Alkaline Phosphatase: 55 U/L (ref 38–126)
Anion gap: 9 (ref 5–15)
BUN: 16 mg/dL (ref 8–23)
CO2: 24 mmol/L (ref 22–32)
Calcium: 8.6 mg/dL — ABNORMAL LOW (ref 8.9–10.3)
Chloride: 91 mmol/L — ABNORMAL LOW (ref 98–111)
Creatinine: 1.04 mg/dL — ABNORMAL HIGH (ref 0.44–1.00)
GFR, Estimated: 58 mL/min — ABNORMAL LOW (ref >60)
Glucose, Bld: 101 mg/dL — ABNORMAL HIGH (ref 70–99)
Potassium: 3.6 mmol/L (ref 3.5–5.1)
Sodium: 124 mmol/L — ABNORMAL LOW (ref 135–145)
Total Bilirubin: 0.3 mg/dL (ref 0.3–1.2)
Total Protein: 7 g/dL (ref 6.5–8.1)

## 2023-05-16 LAB — LACTATE DEHYDROGENASE: LDH: 248 U/L — ABNORMAL HIGH (ref 98–192)

## 2023-05-16 MED ORDER — SODIUM CHLORIDE 0.9% FLUSH
10.0000 mL | INTRAVENOUS | Status: DC | PRN
  Administered 2023-05-16: 10 mL

## 2023-05-16 MED ORDER — DIPHENHYDRAMINE HCL 25 MG PO CAPS
50.0000 mg | ORAL_CAPSULE | Freq: Once | ORAL | Status: AC
  Administered 2023-05-16: 50 mg via ORAL
  Filled 2023-05-16 (×2): qty 2

## 2023-05-16 MED ORDER — ACETAMINOPHEN 325 MG PO TABS
650.0000 mg | ORAL_TABLET | Freq: Once | ORAL | Status: AC
  Administered 2023-05-16: 650 mg via ORAL
  Filled 2023-05-16: qty 2

## 2023-05-16 MED ORDER — HEPARIN SOD (PORK) LOCK FLUSH 100 UNIT/ML IV SOLN
500.0000 [IU] | Freq: Once | INTRAVENOUS | Status: AC | PRN
  Administered 2023-05-16: 500 [IU]

## 2023-05-16 MED ORDER — SODIUM CHLORIDE 0.9 % IV SOLN: Freq: Once | INTRAVENOUS | Status: AC

## 2023-05-16 MED ORDER — PROCHLORPERAZINE MALEATE 10 MG PO TABS
10.0000 mg | ORAL_TABLET | Freq: Once | ORAL | Status: AC
  Administered 2023-05-16: 10 mg via ORAL
  Filled 2023-05-16: qty 1

## 2023-05-16 MED ORDER — SODIUM CHLORIDE 0.9 % IV SOLN
3.6000 mg/kg | Freq: Once | INTRAVENOUS | Status: AC
  Administered 2023-05-16: 320 mg via INTRAVENOUS
  Filled 2023-05-16: qty 16

## 2023-05-16 NOTE — Patient Instructions (Signed)
[facility]  Discharge Instructions: Thank you for choosing [facility] to provide your oncology and hematology care.   If you have a lab appointment with the Cancer Center, please go directly to the Cancer Center and check in at the registration area.  Wear comfortable clothing and clothing appropriate for easy access to any Portacath or PICC line.   We strive to give you quality time with your provider. You may need to reschedule your appointment if you arrive late (15 or more minutes).  Arriving late affects you and other patients whose appointments are after yours.  Also, if you miss three or more appointments without notifying the office, you may be dismissed from the clinic at the provider's discretion.      For prescription refill requests, have your pharmacy contact our office and allow 72 hours for refills to be completed.    Today you received the following chemotherapy and/or immunotherapy agents Kadcyla      To help prevent nausea and vomiting after your treatment, we encourage you to take your nausea medication as directed.  BELOW ARE SYMPTOMS THAT SHOULD BE REPORTED IMMEDIATELY: *FEVER GREATER THAN 100.4 F (38 C) OR HIGHER *CHILLS OR SWEATING *NAUSEA AND VOMITING THAT IS NOT CONTROLLED WITH YOUR NAUSEA MEDICATION *UNUSUAL SHORTNESS OF BREATH *UNUSUAL BRUISING OR BLEEDING *URINARY PROBLEMS (pain or burning when urinating, or frequent urination) *BOWEL PROBLEMS (unusual diarrhea, constipation, pain near the anus) TENDERNESS IN MOUTH AND THROAT WITH OR WITHOUT PRESENCE OF ULCERS (sore throat, sores in mouth, or a toothache) UNUSUAL RASH, SWELLING OR PAIN  UNUSUAL VAGINAL DISCHARGE OR ITCHING   Items with * indicate a potential emergency and should be followed up as soon as possible or go to the Emergency Department if any problems should occur.  Please show the CHEMOTHERAPY ALERT CARD or IMMUNOTHERAPY ALERT CARD at check-in  to the Emergency Department and triage nurse. Should you have questions after your visit or need to cancel or reschedule your appointment, please contact [facility]  [phone] and follow the prompts.  Office hours are 8:00 a.m. to 4:30 p.m. Monday - Friday. Please note that voicemails left after 4:00 p.m. may not be returned until the following business day.  We are closed weekends and major holidays. You have access to a nurse at all times for urgent questions. Please call the main number to the clinic [phone] and follow the prompts.  For any non-urgent questions, you may also contact your provider using MyChart. We now offer e-Visits for anyone 18 and older to request care online for non-urgent symptoms. For details visit [email or web address].   Also download the MyChart app! Go to the app store, search "MyChart", open the app, select [facility], and log in with your MyChart username and password.   

## 2023-05-16 NOTE — Patient Instructions (Signed)

## 2023-05-16 NOTE — Progress Notes (Signed)
Hematology and Oncology Follow Up Visit  Brandi Roberts [id number] 1956-08-02 67 y.o. 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 She is doing well and has no complaints at this time. She states that she has tolerated treatment nicely so far.   She is asymptomatic with the sodium level of 124. We will recheck in 1 week.  No issue with infection. No fever, chills, n/v, cough, rash, dizziness, SOB, chest pain, palpitations, abdominal pain or changes in bowel or bladder habits.  No swelling, tenderness, numbness or tingling in her extremities.  No falls or syncope reported.  Appetite and hydration are good. Weight is stable at 187 lbs.   ECOG Performance Status: 1 - Symptomatic but completely ambulatory  Medications:  Allergies as of 05/16/2023       Reactions   Levaquin [levofloxacin] Nausea And Vomiting, Other (See Comments)   dizziness        Medication List        Accurate as of May 16, 2023  1:23 PM. If you have any questions, ask your nurse or doctor.          albuterol 108 (90 Base) MCG/ACT inhaler Commonly known as: VENTOLIN HFA Inhale 2 puffs into the lungs every 6 (six) hours as needed.   ALPRAZolam 0.5 MG tablet Commonly known as: XANAX Take 0.5 mg by mouth 2 (two) times daily as needed.   amLODipine 10 MG tablet Commonly known as: NORVASC Take 10 mg by mouth daily.   atorvastatin 10 MG tablet Commonly known as: LIPITOR Take 5 mg by mouth at bedtime.   azithromycin 250 MG tablet Commonly known as: ZITHROMAX Take 250 mg by mouth as directed.   benazepril 40 MG tablet Commonly known as: LOTENSIN Take 40 mg by mouth daily.   doxazosin 2 MG tablet Commonly known as: CARDURA Take 2 mg by mouth  2 (two) times daily.   Eliquis 5 MG Tabs tablet Generic drug: apixaban Take 5 mg by mouth 2 (two) times daily.   gabapentin 600 MG tablet Commonly known as: NEURONTIN Take 600 mg by mouth at bedtime.   guaiFENesin-codeine 100-10 MG/5ML syrup Take 10 mLs by mouth every 6 (six) hours as needed.   hydrALAZINE 50 MG tablet Commonly known as: APRESOLINE Take 50 mg by mouth 2 (two) times daily.   HYDROcodone-acetaminophen 5-325 MG tablet Commonly known as: NORCO/VICODIN Take 2 tablets by mouth 2 (two) times daily.   Iron 325 (65 Fe) MG Tabs Take 1 tablet by mouth daily.   metFORMIN 1000 MG tablet Commonly known as: GLUCOPHAGE Take 1,000 mg by mouth 2 (two) times daily.   metoprolol succinate 100 MG 24 hr tablet Commonly known as: TOPROL-XL Take 100 mg by mouth daily.   ondansetron 4 MG disintegrating tablet Commonly known as: ZOFRAN-ODT Take 1 tablet (4 mg total) by mouth every 8 (eight) hours as needed for nausea or vomiting.   sertraline 25 MG tablet Commonly known as: ZOLOFT Take 1 tablet (25 mg total) by mouth daily.   triamterene-hydrochlorothiazide 75-50 MG tablet Commonly known as: MAXZIDE Take 1 tablet by mouth daily.   Vitamin D (Ergocalciferol) 1.25 MG (50000 UNIT) Caps capsule Commonly known as: DRISDOL Take 50,000 Units by mouth once  a week.        Allergies:  Allergies  Allergen Reactions   Levaquin [Levofloxacin] Nausea And Vomiting and Other (See Comments)    dizziness    Past Medical History, Surgical history, Social history, and Family History were reviewed and updated.  Review of Systems: All other 10 point review of systems is negative.   Physical Exam:  weight is 187 lb 12.8 oz (85.2 kg). Her oral temperature is 98.3 F (36.8 C). Her blood pressure is 138/58 (abnormal) and her pulse is 61. Her respiration is 20 and oxygen saturation is 100%.   Wt Readings from Last 3 Encounters:  05/16/23 187 lb 12.8 oz (85.2 kg)  04/25/23 187 lb  (84.8 kg)  03/30/23 188 lb (85.3 kg)    Ocular: Sclerae unicteric, pupils equal, round and reactive to light Ear-nose-throat: Oropharynx clear, dentition fair Lymphatic: No cervical or supraclavicular adenopathy Lungs no rales or rhonchi, good excursion bilaterally Heart regular rate and rhythm, no murmur appreciated Abd soft, nontender, positive bowel sounds MSK no focal spinal tenderness, no joint edema Neuro: non-focal, well-oriented, appropriate affect Breasts: Deferred   Lab Results  Component Value Date   WBC 10.6 (H) 04/25/2023   HGB 10.4 (L) 04/25/2023   HCT 30.4 (L) 04/25/2023   MCV 88.6 04/25/2023   PLT 266 04/25/2023   Lab Results  Component Value Date   FERRITIN 258 01/02/2023   IRON 105 01/02/2023   TIBC 354 01/02/2023   UIBC 249 01/02/2023   IRONPCTSAT 30 01/02/2023   Lab Results  Component Value Date   RBC 3.43 (L) 04/25/2023   No results found for: "KPAFRELGTCHN", "LAMBDASER", "KAPLAMBRATIO" No results found for: "IGGSERUM", "IGA", "IGMSERUM" No results found for: "TOTALPROTELP", "ALBUMINELP", "A1GS", "A2GS", "BETS", "BETA2SER", "GAMS", "MSPIKE", "SPEI"   Chemistry      Component Value Date/Time   NA 134 (L) 04/25/2023 1120   K 3.5 04/25/2023 1120   CL 95 (L) 04/25/2023 1120   CO2 28 04/25/2023 1120   BUN 21 04/25/2023 1120   CREATININE 1.28 (H) 04/25/2023 1120      Component Value Date/Time   CALCIUM 8.9 04/25/2023 1120   ALKPHOS 63 04/25/2023 1120   AST 26 04/25/2023 1120   ALT 16 04/25/2023 1120   BILITOT 0.3 04/25/2023 1120       Impression and Plan: Brandi Roberts is a pleasant 67 yo African American female with invasive ductal carcinoma of the right breast, ER-/PR-/HER2 + treated with mastectomy. She had 1 positive lymph node. She is doing well on Kadcyla and we will proceed with treatment today as planned. She should finish treatment in June.  She will restart her Gatorade and have some lays potato chips! Lab only in 1 week for sodium  level. Follow-up in 3 weeks with MD.   Brandi Stanford, NP 5/21/20241:23 PM

## 2023-05-16 NOTE — Progress Notes (Signed)
Patient will proceed with her Kadcyla. Her last cycle will be next month.   Oncology Nurse Navigator Documentation     05/16/2023    1:30 PM  Oncology Nurse Navigator Flowsheets  Navigator Follow Up Date: 06/06/2023  Navigator Follow Up Reason: Follow-up Appointment;Chemotherapy  Navigator Location [facility]  Navigator Encounter Type Appt/Treatment Plan Review  Patient Visit Type MedOnc  Treatment Phase Active Tx  Barriers/Navigation Needs No Barriers At This Time  Interventions None Required  Acuity Level 1-No Barriers  Support Groups/Services Friends and Family  Time Spent with Patient 15

## 2023-05-16 NOTE — Progress Notes (Signed)
OK to treat w/ today's serum [Na] per Eileen Stanford, NP.  Ebony Hail, Pharm.D., CPP 05/16/2023@2 :29 PM

## 2023-05-17 ENCOUNTER — Encounter: Payer: Self-pay | Admitting: Hematology & Oncology

## 2023-05-17 ENCOUNTER — Other Ambulatory Visit: Payer: Self-pay

## 2023-05-18 ENCOUNTER — Telehealth: Payer: Self-pay

## 2023-05-18 ENCOUNTER — Other Ambulatory Visit: Payer: Self-pay

## 2023-05-18 DIAGNOSIS — E871 Hypo-osmolality and hyponatremia: Secondary | ICD-10-CM

## 2023-05-18 NOTE — Telephone Encounter (Signed)
Attempted to call patient and let her know that her sodium is low again and that Dr. Myna Hidalgo would like her to come in for IVF. This RN called both numbers with no answer to the home and mailbox full for cell phone. Will attempt to call patient again. Scheduling message sent to attempt to set up an appointment for fluids.

## 2023-05-18 NOTE — Telephone Encounter (Signed)
-----   Message from Josph Macho, MD sent at 05/17/2023  5:02 PM EDT ----- Please call and let her know that sodium is low again.  I really think that she is low on volume.  She is coming for some IV fluid.  Thanks.  Cindee Lame

## 2023-05-19 ENCOUNTER — Inpatient Hospital Stay (HOSPITAL_BASED_OUTPATIENT_CLINIC_OR_DEPARTMENT_OTHER): Payer: Medicare Other | Admitting: Family

## 2023-05-19 ENCOUNTER — Ambulatory Visit: Payer: Medicare Other

## 2023-05-19 ENCOUNTER — Inpatient Hospital Stay: Payer: Medicare Other

## 2023-05-19 ENCOUNTER — Encounter: Payer: Self-pay | Admitting: Family

## 2023-05-19 ENCOUNTER — Other Ambulatory Visit: Payer: Self-pay

## 2023-05-19 VITALS — BP 141/57 | HR 65 | Temp 98.2°F | Resp 17 | Ht 66.0 in | Wt 187.1 lb

## 2023-05-19 DIAGNOSIS — Z171 Estrogen receptor negative status [ER-]: Secondary | ICD-10-CM | POA: Diagnosis not present

## 2023-05-19 DIAGNOSIS — C50911 Malignant neoplasm of unspecified site of right female breast: Secondary | ICD-10-CM

## 2023-05-19 DIAGNOSIS — E871 Hypo-osmolality and hyponatremia: Secondary | ICD-10-CM

## 2023-05-19 LAB — CMP (CANCER CENTER ONLY)
ALT: 30 U/L (ref 0–44)
AST: 54 U/L — ABNORMAL HIGH (ref 15–41)
Albumin: 3.5 g/dL (ref 3.5–5.0)
Alkaline Phosphatase: 62 U/L (ref 38–126)
Anion gap: 7 (ref 5–15)
BUN: 17 mg/dL (ref 8–23)
CO2: 27 mmol/L (ref 22–32)
Calcium: 9.1 mg/dL (ref 8.9–10.3)
Chloride: 93 mmol/L — ABNORMAL LOW (ref 98–111)
Creatinine: 1.05 mg/dL — ABNORMAL HIGH (ref 0.44–1.00)
GFR, Estimated: 58 mL/min — ABNORMAL LOW (ref >60)
Glucose, Bld: 97 mg/dL (ref 70–99)
Potassium: 3.2 mmol/L — ABNORMAL LOW (ref 3.5–5.1)
Sodium: 127 mmol/L — ABNORMAL LOW (ref 135–145)
Total Bilirubin: 0.3 mg/dL (ref 0.3–1.2)
Total Protein: 6.6 g/dL (ref 6.5–8.1)

## 2023-05-19 LAB — CBC WITH DIFFERENTIAL (CANCER CENTER ONLY)
Abs Immature Granulocytes: 0.17 10*3/uL — ABNORMAL HIGH (ref 0.00–0.07)
Basophils Absolute: 0 10*3/uL (ref 0.0–0.1)
Basophils Relative: 0 %
Eosinophils Absolute: 0.1 10*3/uL (ref 0.0–0.5)
Eosinophils Relative: 1 %
HCT: 30.2 % — ABNORMAL LOW (ref 36.0–46.0)
Hemoglobin: 10.3 g/dL — ABNORMAL LOW (ref 12.0–15.0)
Immature Granulocytes: 2 %
Lymphocytes Relative: 18 %
Lymphs Abs: 1.8 10*3/uL (ref 0.7–4.0)
MCH: 30 pg (ref 26.0–34.0)
MCHC: 34.1 g/dL (ref 30.0–36.0)
MCV: 88 fL (ref 80.0–100.0)
Monocytes Absolute: 1.1 10*3/uL — ABNORMAL HIGH (ref 0.1–1.0)
Monocytes Relative: 11 %
Neutro Abs: 6.8 10*3/uL (ref 1.7–7.7)
Neutrophils Relative %: 68 %
Platelet Count: 174 10*3/uL (ref 150–400)
RBC: 3.43 MIL/uL — ABNORMAL LOW (ref 3.87–5.11)
RDW: 12.8 % (ref 11.5–15.5)
WBC Count: 10 10*3/uL (ref 4.0–10.5)
nRBC: 0 % (ref 0.0–0.2)

## 2023-05-19 MED ORDER — SODIUM CHLORIDE 0.9 % IV SOLN: INTRAVENOUS | Status: DC

## 2023-05-19 NOTE — Patient Instructions (Signed)
Hyponatremia Hyponatremia is when the amount of salt (sodium) in your blood is too low. When salt levels are low, your body cells may take in extra water. This can cause swelling. The swelling often affects the brain. What are the causes? Certain medical problems or conditions. Vomiting a lot. Having watery poop (diarrhea) often. Sweating too much. Taking certain medicines or using illegal drugs. Fluids given through an IV tube. What increases the risk? Having heart, kidney, or liver failure. Having a medical condition that causes you to have watery poop a lot. Doing very hard exercises. Taking medicines that affect the amount of salt that is in your blood. What are the signs or symptoms? Symptoms of this condition include: Headache. Feeling like you may vomit (nausea). Vomiting. Being very tired. Muscle weakness and cramps. Not wanting to eat as much as normal. Feeling weak or dizzy. Very bad symptoms of this condition include: Confusion. Feeling restless. Having a fast heart rate. Fainting. Seizures. Coma. How is this treated? Treatment for this condition depends on the cause. Treatment may include: Getting fluids through an IV tube that is put into one of your veins. Taking medicines to fix the salt levels in your blood. If medicines are causing the problem, your medicines will need to be changed. Limiting how much water or fluid you take in, in some cases. Monitoring in the hospital to watch your symptoms. Follow these instructions at home:  Take over-the-counter and prescription medicines only as told by your doctor. Many medicines can make this condition worse. Talk with your doctor about any medicines that you are taking. Do not drink alcohol. Keep all follow-up visits. Contact a doctor if: You feel more like you may vomit. You feel more tired. Your headache gets worse. You feel more confused. You feel weaker. Your symptoms go away and then they come back. Get  help right away if: You have a seizure. You faint. You keep having watery poop. You keep vomiting. Summary Hyponatremia is when the amount of salt in your blood is too low. When salt levels are low, you can have swelling throughout the body. The swelling mostly affects the brain. Treatment depends on the cause. Treatment may include IV fluids and changing medicines. This information is not intended to replace advice given to you by your health care provider. Make sure you discuss any questions you have with your health care provider. Document Revised: 06/22/2021 Document Reviewed: 06/22/2021 Elsevier Patient Education  2024 [employer].

## 2023-05-19 NOTE — Progress Notes (Signed)
Hematology and Oncology Follow Up Visit  Azrah Candell [id number] 05/13/1956 67 y.o. 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 Guerrera is here today for follow-up. She is doing well and has no complaints at this time.  She remains asymptomatic with sodium 127.  She denies fatigue.  No fever, chills, n/v, cough, rash, dizziness, SOB, chest pain, palpitations, abdominal pain or changes in bowel or bladder habits.  No swelling, tenderness, numbness or tingling in her extremities.  No falls or syncope.  Appetite and hydration are improving. Weight is stable 188 lbs.   ECOG Performance Status: 0 - Asymptomatic  Medications:  Allergies as of 05/19/2023       Reactions   Levaquin [levofloxacin] Nausea And Vomiting, Other (See Comments)   dizziness        Medication List        Accurate as of May 19, 2023  2:46 PM. If you have any questions, ask your nurse or doctor.          STOP taking these medications    azithromycin 250 MG tablet Commonly known as: ZITHROMAX Stopped by: Eileen Stanford, NP   guaiFENesin-codeine 100-10 MG/5ML syrup Stopped by: Eileen Stanford, NP       TAKE these medications    albuterol 108 (90 Base) MCG/ACT inhaler Commonly known as: VENTOLIN HFA Inhale 2 puffs into the lungs every 6 (six) hours as needed.   ALPRAZolam 0.5 MG tablet Commonly known as: XANAX Take 0.5 mg by mouth 2 (two) times daily as needed.   amLODipine 10 MG tablet Commonly known as: NORVASC Take 10 mg by mouth daily.   atorvastatin 10 MG tablet Commonly known as: LIPITOR Take 5 mg by mouth at bedtime.   benazepril 40 MG tablet Commonly known as: LOTENSIN Take 40 mg by mouth daily.   doxazosin 2 MG tablet Commonly known as: CARDURA Take 2 mg by mouth 2  (two) times daily.   Eliquis 5 MG Tabs tablet Generic drug: apixaban Take 5 mg by mouth 2 (two) times daily.   gabapentin 600 MG tablet Commonly known as: NEURONTIN Take 600 mg by mouth at bedtime.   hydrALAZINE 50 MG tablet Commonly known as: APRESOLINE Take 50 mg by mouth 2 (two) times daily.   HYDROcodone-acetaminophen 5-325 MG tablet Commonly known as: NORCO/VICODIN Take 2 tablets by mouth 2 (two) times daily.   Iron 325 (65 Fe) MG Tabs Take 1 tablet by mouth daily.   metFORMIN 1000 MG tablet Commonly known as: GLUCOPHAGE Take 1,000 mg by mouth 2 (two) times daily.   metoprolol succinate 100 MG 24 hr tablet Commonly known as: TOPROL-XL Take 100 mg by mouth daily.   ondansetron 4 MG disintegrating tablet Commonly known as: ZOFRAN-ODT Take 1 tablet (4 mg total) by mouth every 8 (eight) hours as needed for nausea or vomiting.   sertraline 25 MG tablet Commonly known as: ZOLOFT Take 1 tablet (25 mg total) by mouth daily.   triamterene-hydrochlorothiazide 75-50 MG tablet Commonly known as: MAXZIDE Take 1 tablet by mouth daily.   Vitamin D (Ergocalciferol) 1.25 MG (50000 UNIT) Caps capsule Commonly known as: DRISDOL Take 50,000 Units by mouth once a week.        Allergies:  Allergies  Allergen Reactions   Levaquin [  Levofloxacin] Nausea And Vomiting and Other (See Comments)    dizziness    Past Medical History, Surgical history, Social history, and Family History were reviewed and updated.  Review of Systems: All other 10 point review of systems is negative.   Physical Exam:  height is 5\' 6"  (1.676 m) and weight is 187 lb 1.9 oz (84.9 kg). Her oral temperature is 98.2 F (36.8 C). Her blood pressure is 141/57 (abnormal) and her pulse is 65. Her respiration is 17 and oxygen saturation is 100%.   Wt Readings from Last 3 Encounters:  05/19/23 187 lb 1.9 oz (84.9 kg)  05/16/23 187 lb 12.8 oz (85.2 kg)  04/25/23 187 lb (84.8 kg)    Ocular: Sclerae  unicteric, pupils equal, round and reactive to light Ear-nose-throat: Oropharynx clear, dentition fair Lymphatic: No cervical or supraclavicular adenopathy Lungs no rales or rhonchi, good excursion bilaterally Heart regular rate and rhythm, no murmur appreciated Abd soft, nontender, positive bowel sounds MSK no focal spinal tenderness, no joint edema Neuro: non-focal, well-oriented, appropriate affect Breasts: Deferred   Lab Results  Component Value Date   WBC 10.0 05/19/2023   HGB 10.3 (L) 05/19/2023   HCT 30.2 (L) 05/19/2023   MCV 88.0 05/19/2023   PLT 174 05/19/2023   Lab Results  Component Value Date   FERRITIN 258 01/02/2023   IRON 105 01/02/2023   TIBC 354 01/02/2023   UIBC 249 01/02/2023   IRONPCTSAT 30 01/02/2023   Lab Results  Component Value Date   RBC 3.43 (L) 05/19/2023   No results found for: "KPAFRELGTCHN", "LAMBDASER", "KAPLAMBRATIO" No results found for: "IGGSERUM", "IGA", "IGMSERUM" No results found for: "TOTALPROTELP", "ALBUMINELP", "A1GS", "A2GS", "BETS", "BETA2SER", "GAMS", "MSPIKE", "SPEI"   Chemistry      Component Value Date/Time   NA 127 (L) 05/19/2023 1255   K 3.2 (L) 05/19/2023 1255   CL 93 (L) 05/19/2023 1255   CO2 27 05/19/2023 1255   BUN 17 05/19/2023 1255   CREATININE 1.05 (H) 05/19/2023 1255      Component Value Date/Time   CALCIUM 9.1 05/19/2023 1255   ALKPHOS 62 05/19/2023 1255   AST 54 (H) 05/19/2023 1255   ALT 30 05/19/2023 1255   BILITOT 0.3 05/19/2023 1255       Impression and Plan: Ms. Wellens is a pleasant 67 yo African American female with invasive ductal carcinoma of the right breast, ER-/PR-/HER2 + treated with mastectomy. She had 1 positive lymph node. Sodium 127 today. No new orders per MD.  We will check her thyroid at follow-up on 6/11.   Eileen Stanford, NP 5/24/20242:46 PM

## 2023-05-19 NOTE — Patient Instructions (Signed)

## 2023-05-23 ENCOUNTER — Other Ambulatory Visit: Payer: Medicare Other

## 2023-05-26 ENCOUNTER — Other Ambulatory Visit: Payer: Self-pay

## 2023-06-06 ENCOUNTER — Inpatient Hospital Stay: Payer: Medicare Other

## 2023-06-06 ENCOUNTER — Inpatient Hospital Stay (HOSPITAL_BASED_OUTPATIENT_CLINIC_OR_DEPARTMENT_OTHER): Payer: Medicare Other | Admitting: Hematology & Oncology

## 2023-06-06 ENCOUNTER — Encounter: Payer: Self-pay | Admitting: Hematology & Oncology

## 2023-06-06 ENCOUNTER — Inpatient Hospital Stay: Payer: Medicare Other | Attending: Hematology & Oncology

## 2023-06-06 ENCOUNTER — Other Ambulatory Visit: Payer: Self-pay

## 2023-06-06 VITALS — BP 146/60 | HR 74 | Temp 97.7°F | Resp 18 | Ht 66.0 in | Wt 184.0 lb

## 2023-06-06 DIAGNOSIS — Z79899 Other long term (current) drug therapy: Secondary | ICD-10-CM | POA: Insufficient documentation

## 2023-06-06 DIAGNOSIS — Z5111 Encounter for antineoplastic chemotherapy: Secondary | ICD-10-CM | POA: Insufficient documentation

## 2023-06-06 DIAGNOSIS — C50911 Malignant neoplasm of unspecified site of right female breast: Secondary | ICD-10-CM | POA: Diagnosis not present

## 2023-06-06 DIAGNOSIS — E871 Hypo-osmolality and hyponatremia: Secondary | ICD-10-CM | POA: Insufficient documentation

## 2023-06-06 DIAGNOSIS — Z171 Estrogen receptor negative status [ER-]: Secondary | ICD-10-CM | POA: Diagnosis not present

## 2023-06-06 DIAGNOSIS — Z170 Estrogen receptor positive status [ER+]: Secondary | ICD-10-CM | POA: Diagnosis not present

## 2023-06-06 DIAGNOSIS — Z9011 Acquired absence of right breast and nipple: Secondary | ICD-10-CM | POA: Insufficient documentation

## 2023-06-06 LAB — CBC WITH DIFFERENTIAL (CANCER CENTER ONLY)
Abs Immature Granulocytes: 0.01 10*3/uL (ref 0.00–0.07)
Basophils Absolute: 0 10*3/uL (ref 0.0–0.1)
Basophils Relative: 1 %
Eosinophils Absolute: 0.1 10*3/uL (ref 0.0–0.5)
Eosinophils Relative: 1 %
HCT: 30.4 % — ABNORMAL LOW (ref 36.0–46.0)
Hemoglobin: 10.8 g/dL — ABNORMAL LOW (ref 12.0–15.0)
Immature Granulocytes: 0 %
Lymphocytes Relative: 19 %
Lymphs Abs: 1.5 10*3/uL (ref 0.7–4.0)
MCH: 30.8 pg (ref 26.0–34.0)
MCHC: 35.5 g/dL (ref 30.0–36.0)
MCV: 86.6 fL (ref 80.0–100.0)
Monocytes Absolute: 0.7 10*3/uL (ref 0.1–1.0)
Monocytes Relative: 9 %
Neutro Abs: 5.6 10*3/uL (ref 1.7–7.7)
Neutrophils Relative %: 70 %
Platelet Count: 247 10*3/uL (ref 150–400)
RBC: 3.51 MIL/uL — ABNORMAL LOW (ref 3.87–5.11)
RDW: 12.9 % (ref 11.5–15.5)
WBC Count: 7.9 10*3/uL (ref 4.0–10.5)
nRBC: 0 % (ref 0.0–0.2)

## 2023-06-06 LAB — CMP (CANCER CENTER ONLY)
ALT: 30 U/L (ref 0–44)
AST: 61 U/L — ABNORMAL HIGH (ref 15–41)
Albumin: 3.5 g/dL (ref 3.5–5.0)
Alkaline Phosphatase: 55 U/L (ref 38–126)
Anion gap: 8 (ref 5–15)
BUN: 18 mg/dL (ref 8–23)
CO2: 27 mmol/L (ref 22–32)
Calcium: 9.5 mg/dL (ref 8.9–10.3)
Chloride: 93 mmol/L — ABNORMAL LOW (ref 98–111)
Creatinine: 1.15 mg/dL — ABNORMAL HIGH (ref 0.44–1.00)
GFR, Estimated: 52 mL/min — ABNORMAL LOW (ref >60)
Glucose, Bld: 105 mg/dL — ABNORMAL HIGH (ref 70–99)
Potassium: 3.4 mmol/L — ABNORMAL LOW (ref 3.5–5.1)
Sodium: 128 mmol/L — ABNORMAL LOW (ref 135–145)
Total Bilirubin: 0.4 mg/dL (ref 0.3–1.2)
Total Protein: 7 g/dL (ref 6.5–8.1)

## 2023-06-06 LAB — T4, FREE: Free T4: 0.97 ng/dL (ref 0.61–1.12)

## 2023-06-06 LAB — LACTATE DEHYDROGENASE: LDH: 344 U/L — ABNORMAL HIGH (ref 98–192)

## 2023-06-06 LAB — TSH: TSH: 3.249 u[IU]/mL (ref 0.350–4.500)

## 2023-06-06 MED ORDER — DIPHENHYDRAMINE HCL 25 MG PO CAPS
50.0000 mg | ORAL_CAPSULE | Freq: Once | ORAL | Status: AC
  Administered 2023-06-06: 50 mg via ORAL
  Filled 2023-06-06: qty 2

## 2023-06-06 MED ORDER — SODIUM CHLORIDE 0.9% FLUSH
10.0000 mL | INTRAVENOUS | Status: DC | PRN
  Administered 2023-06-06: 10 mL

## 2023-06-06 MED ORDER — SODIUM CHLORIDE 0.9 % IV SOLN
3.6000 mg/kg | Freq: Once | INTRAVENOUS | Status: AC
  Administered 2023-06-06: 320 mg via INTRAVENOUS
  Filled 2023-06-06: qty 16

## 2023-06-06 MED ORDER — PROCHLORPERAZINE MALEATE 10 MG PO TABS
10.0000 mg | ORAL_TABLET | Freq: Once | ORAL | Status: AC
  Administered 2023-06-06: 10 mg via ORAL
  Filled 2023-06-06: qty 1

## 2023-06-06 MED ORDER — SODIUM CHLORIDE 0.9 % IV SOLN: INTRAVENOUS | Status: AC

## 2023-06-06 MED ORDER — PROCHLORPERAZINE EDISYLATE 10 MG/2ML IJ SOLN: 10.0000 mg | Freq: Once | INTRAMUSCULAR | Status: DC

## 2023-06-06 MED ORDER — ACETAMINOPHEN 325 MG PO TABS
650.0000 mg | ORAL_TABLET | Freq: Once | ORAL | Status: AC
  Administered 2023-06-06: 650 mg via ORAL
  Filled 2023-06-06: qty 2

## 2023-06-06 MED ORDER — SODIUM CHLORIDE 0.9 % IV SOLN: Freq: Once | INTRAVENOUS | Status: AC

## 2023-06-06 MED ORDER — HEPARIN SOD (PORK) LOCK FLUSH 100 UNIT/ML IV SOLN
500.0000 [IU] | Freq: Once | INTRAVENOUS | Status: AC | PRN
  Administered 2023-06-06: 500 [IU]

## 2023-06-06 NOTE — Patient Instructions (Signed)

## 2023-06-06 NOTE — Patient Instructions (Signed)
[facility]  Discharge Instructions: Thank you for choosing [facility] to provide your oncology and hematology care.   If you have a lab appointment with the Cancer Center, please go directly to the Cancer Center and check in at the registration area.  Wear comfortable clothing and clothing appropriate for easy access to any Portacath or PICC line.   We strive to give you quality time with your provider. You may need to reschedule your appointment if you arrive late (15 or more minutes).  Arriving late affects you and other patients whose appointments are after yours.  Also, if you miss three or more appointments without notifying the office, you may be dismissed from the clinic at the provider's discretion.      For prescription refill requests, have your pharmacy contact our office and allow 72 hours for refills to be completed.    Today you received the following chemotherapy and/or immunotherapy agents Kadcyla      To help prevent nausea and vomiting after your treatment, we encourage you to take your nausea medication as directed.  BELOW ARE SYMPTOMS THAT SHOULD BE REPORTED IMMEDIATELY: *FEVER GREATER THAN 100.4 F (38 C) OR HIGHER *CHILLS OR SWEATING *NAUSEA AND VOMITING THAT IS NOT CONTROLLED WITH YOUR NAUSEA MEDICATION *UNUSUAL SHORTNESS OF BREATH *UNUSUAL BRUISING OR BLEEDING *URINARY PROBLEMS (pain or burning when urinating, or frequent urination) *BOWEL PROBLEMS (unusual diarrhea, constipation, pain near the anus) TENDERNESS IN MOUTH AND THROAT WITH OR WITHOUT PRESENCE OF ULCERS (sore throat, sores in mouth, or a toothache) UNUSUAL RASH, SWELLING OR PAIN  UNUSUAL VAGINAL DISCHARGE OR ITCHING   Items with * indicate a potential emergency and should be followed up as soon as possible or go to the Emergency Department if any problems should occur.  Please show the CHEMOTHERAPY ALERT CARD or IMMUNOTHERAPY ALERT CARD at check-in  to the Emergency Department and triage nurse. Should you have questions after your visit or need to cancel or reschedule your appointment, please contact [facility]  [phone] and follow the prompts.  Office hours are 8:00 a.m. to 4:30 p.m. Monday - Friday. Please note that voicemails left after 4:00 p.m. may not be returned until the following business day.  We are closed weekends and major holidays. You have access to a nurse at all times for urgent questions. Please call the main number to the clinic [phone] and follow the prompts.  For any non-urgent questions, you may also contact your provider using MyChart. We now offer e-Visits for anyone 18 and older to request care online for non-urgent symptoms. For details visit [email or web address].   Also download the MyChart app! Go to the app store, search "MyChart", open the app, select [facility], and log in with your MyChart username and password.   

## 2023-06-06 NOTE — Progress Notes (Signed)
Hematology and Oncology Follow Up Visit  Brandi Roberts [id number] [birth year] 67 y.o. 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 is here today for follow-up.  This will be her last Kadcyla.  Is been a year that she has had Kadcyla.  She is done pretty well with it.  She feels okay.  She still has some hyponatremia.  Her sodium today is 128.  Her chloride is 93.  Her BUN is up a little bit.  Again I had to believe this is all hydration related.  She has had no diarrhea.  She has had no nausea or vomiting.  She has had no leg swelling.  There is been no rashes.  She has had no bleeding.  She has had no cough.  There is no headache.  Overall, I would have said that her performance status is probably ECOG 1.    Medications:  Allergies as of 06/06/2023       Reactions   Levaquin [levofloxacin] Nausea And Vomiting, Other (See Comments)   dizziness        Medication List        Accurate as of June 06, 2023 11:12 AM. If you have any questions, ask your nurse or doctor.          albuterol 108 (90 Base) MCG/ACT inhaler Commonly known as: VENTOLIN HFA Inhale 2 puffs into the lungs every 6 (six) hours as needed.   ALPRAZolam 0.5 MG tablet Commonly known as: XANAX Take 0.5 mg by mouth 2 (two) times daily as needed.   amLODipine 10 MG tablet Commonly known as: NORVASC Take 10 mg by mouth daily.   atorvastatin 10 MG tablet Commonly known as: LIPITOR Take 5 mg by mouth at bedtime.   benazepril 40 MG tablet Commonly known as: LOTENSIN Take 40 mg by mouth daily.   doxazosin 2 MG tablet Commonly known as: CARDURA Take 2 mg by mouth 2 (two) times daily.   Eliquis 5 MG Tabs tablet Generic drug: apixaban Take 5 mg by mouth 2 (two) times daily.   gabapentin  600 MG tablet Commonly known as: NEURONTIN Take 600 mg by mouth at bedtime.   hydrALAZINE 50 MG tablet Commonly known as: APRESOLINE Take 50 mg by mouth 2 (two) times daily.   HYDROcodone-acetaminophen 5-325 MG tablet Commonly known as: NORCO/VICODIN Take 2 tablets by mouth 2 (two) times daily.   Iron 325 (65 Fe) MG Tabs Take 1 tablet by mouth daily.   metFORMIN 1000 MG tablet Commonly known as: GLUCOPHAGE Take 1,000 mg by mouth 2 (two) times daily.   metoprolol succinate 100 MG 24 hr tablet Commonly known as: TOPROL-XL Take 100 mg by mouth daily.   ondansetron 4 MG disintegrating tablet Commonly known as: ZOFRAN-ODT Take 1 tablet (4 mg total) by mouth every 8 (eight) hours as needed for nausea or vomiting.   sertraline 25 MG tablet Commonly known as: ZOLOFT Take 1 tablet (25 mg total) by mouth daily.   triamterene-hydrochlorothiazide 75-50 MG tablet Commonly known as: MAXZIDE Take 1 tablet by mouth daily.   Vitamin D (Ergocalciferol) 1.25 MG (50000 UNIT) Caps capsule Commonly known as: DRISDOL Take 50,000 Units by mouth once a week.        Allergies:  Allergies  Allergen  Reactions   Levaquin [Levofloxacin] Nausea And Vomiting and Other (See Comments)    dizziness    Past Medical History, Surgical history, Social history, and Family History were reviewed and updated.  Review of Systems: Review of Systems  Constitutional: Negative.   HENT: Negative.    Eyes: Negative.   Respiratory: Negative.    Cardiovascular: Negative.   Gastrointestinal: Negative.   Genitourinary: Negative.   Musculoskeletal: Negative.   Skin: Negative.   Neurological: Negative.   Endo/Heme/Allergies: Negative.   Psychiatric/Behavioral: Negative.       Physical Exam:  height is 5\' 6"  (1.676 m) and weight is 184 lb (83.5 kg). Her oral temperature is 97.7 F (36.5 C). Her blood pressure is 146/60 (abnormal) and her pulse is 74. Her respiration is 18 and oxygen saturation is 100%.    Wt Readings from Last 3 Encounters:  06/06/23 184 lb (83.5 kg)  05/19/23 187 lb 1.9 oz (84.9 kg)  05/16/23 187 lb 12.8 oz (85.2 kg)    Physical Exam Vitals reviewed.  Constitutional:      Comments: Her breast exam shows left breast with no masses, edema or erythema.  There is no left axillary adenopathy.  Right chest wall shows healed mastectomy scar.  There is little bit of hyperpigmentation.  She has had no masses.  There is no erythema.  There is no right axillary adenopathy.  HENT:     Head: Normocephalic and atraumatic.  Eyes:     Pupils: Pupils are equal, round, and reactive to light.  Cardiovascular:     Rate and Rhythm: Normal rate and regular rhythm.     Heart sounds: Normal heart sounds.  Pulmonary:     Effort: Pulmonary effort is normal.     Breath sounds: Normal breath sounds.  Abdominal:     General: Bowel sounds are normal.     Palpations: Abdomen is soft.  Musculoskeletal:        General: No tenderness or deformity. Normal range of motion.     Cervical back: Normal range of motion.  Lymphadenopathy:     Cervical: No cervical adenopathy.  Skin:    General: Skin is warm and dry.     Findings: No erythema or rash.  Neurological:     Mental Status: She is alert and oriented to person, place, and time.  Psychiatric:        Behavior: Behavior normal.        Thought Content: Thought content normal.        Judgment: Judgment normal.      Lab Results  Component Value Date   WBC 7.9 06/06/2023   HGB 10.8 (L) 06/06/2023   HCT 30.4 (L) 06/06/2023   MCV 86.6 06/06/2023   PLT 247 06/06/2023   Lab Results  Component Value Date   FERRITIN 258 01/02/2023   IRON 105 01/02/2023   TIBC 354 01/02/2023   UIBC 249 01/02/2023   IRONPCTSAT 30 01/02/2023   Lab Results  Component Value Date   RBC 3.51 (L) 06/06/2023   No results found for: "KPAFRELGTCHN", "LAMBDASER", "KAPLAMBRATIO" No results found for: "IGGSERUM", "IGA", "IGMSERUM" No results found for:  "TOTALPROTELP", "ALBUMINELP", "A1GS", "A2GS", "BETS", "BETA2SER", "GAMS", "MSPIKE", "SPEI"   Chemistry      Component Value Date/Time   NA 128 (L) 06/06/2023 1029   K 3.4 (L) 06/06/2023 1029   CL 93 (L) 06/06/2023 1029   CO2 27 06/06/2023 1029   BUN 18 06/06/2023 1029   CREATININE 1.15 (H) 06/06/2023 1029  Component Value Date/Time   CALCIUM 9.5 06/06/2023 1029   ALKPHOS 55 06/06/2023 1029   AST 61 (H) 06/06/2023 1029   ALT 30 06/06/2023 1029   BILITOT 0.4 06/06/2023 1029       Impression and Plan: Ms. Doke is a pleasant 67 yo African American female with invasive ductal carcinoma of the right breast, ER-/PR-/HER2 + treated with mastectomy.   She had 1 positive lymph node.  We will finish up the Kadcyla today.  I would then like to get her set up with a baseline CT scan to see how everything looks.  She also need an echocardiogram.  I am just happy that she completed her treatments.  I would like to hope that her sodium will normalize somewhat now that she has completed therapy.  Given that she has disease that is ER positive, I do not see that we have to utilize aromatase inhibitor therapy.  I would like to get her back in about 6 weeks.  Will see about doing the echocardiogram and CT scan in about 4 weeks.   Josph Macho, MD 6/11/202411:12 AM

## 2023-06-07 ENCOUNTER — Other Ambulatory Visit: Payer: Self-pay

## 2023-06-08 ENCOUNTER — Encounter: Payer: Self-pay | Admitting: *Deleted

## 2023-06-08 NOTE — Progress Notes (Signed)
Patient completed her Kadcyla this week. She will need an end of treatment Echo and CT.   Oncology Nurse Navigator Documentation     06/08/2023    1:00 PM  Oncology Nurse Navigator Flowsheets  Navigator Follow Up Date: 07/06/2023  Navigator Follow Up Reason: Scan Review  Navigator Location [facility]  Navigator Encounter Type Appt/Treatment Plan Review  Patient Visit Type MedOnc  Treatment Phase Post-Tx Follow-up  Barriers/Navigation Needs No Barriers At This Time  Interventions None Required  Acuity Level 1-No Barriers  Support Groups/Services Friends and Family  Time Spent with Patient 15

## 2023-06-20 ENCOUNTER — Encounter: Payer: Self-pay | Admitting: Hematology & Oncology

## 2023-07-06 ENCOUNTER — Ambulatory Visit (HOSPITAL_COMMUNITY)
Admission: RE | Admit: 2023-07-06 | Discharge: 2023-07-06 | Disposition: A | Discharge status: Home / Self Care | Payer: Medicare Other | Source: Ambulatory Visit | Attending: Hematology & Oncology | Admitting: Hematology & Oncology

## 2023-07-06 DIAGNOSIS — I080 Rheumatic disorders of both mitral and aortic valves: Secondary | ICD-10-CM | POA: Insufficient documentation

## 2023-07-06 DIAGNOSIS — Z0189 Encounter for other specified special examinations: Secondary | ICD-10-CM | POA: Diagnosis not present

## 2023-07-06 DIAGNOSIS — C50911 Malignant neoplasm of unspecified site of right female breast: Secondary | ICD-10-CM

## 2023-07-06 DIAGNOSIS — Z171 Estrogen receptor negative status [ER-]: Secondary | ICD-10-CM | POA: Diagnosis not present

## 2023-07-06 LAB — ECHOCARDIOGRAM COMPLETE
AR max vel: 2.07 cm2
AV Area VTI: 2.16 cm2
AV Area mean vel: 2.11 cm2
AV Mean grad: 4 mmHg
AV Peak grad: 7.8 mmHg
Ao pk vel: 1.4 m/s
Area-P 1/2: 3.93 cm2
S' Lateral: 3.7 cm

## 2023-07-06 MED ORDER — IOHEXOL 300 MG/ML  SOLN
100.0000 mL | Freq: Once | INTRAMUSCULAR | Status: AC | PRN
  Administered 2023-07-06: 100 mL via INTRAVENOUS

## 2023-07-07 ENCOUNTER — Telehealth: Payer: Self-pay | Admitting: Hematology & Oncology

## 2023-07-07 ENCOUNTER — Other Ambulatory Visit: Payer: Self-pay | Admitting: Hematology & Oncology

## 2023-07-07 ENCOUNTER — Encounter: Payer: Self-pay | Admitting: *Deleted

## 2023-07-07 ENCOUNTER — Telehealth: Payer: Self-pay

## 2023-07-07 DIAGNOSIS — C50911 Malignant neoplasm of unspecified site of right female breast: Secondary | ICD-10-CM

## 2023-07-07 NOTE — Progress Notes (Signed)
Reviewed patient's CT with Dr Myna Hidalgo. There is concern for metastatic disease. Dr Myna Hidalgo will call patient later today with plan. Will follow for scheduling.   Oncology Nurse Navigator Documentation     07/07/2023    8:15 AM  Oncology Nurse Navigator Flowsheets  Navigator Location [facility]  Navigator Encounter Type Scan Review  Patient Visit Type MedOnc  Treatment Phase Post-Tx Follow-up  Barriers/Navigation Needs No Barriers At This Time  Interventions Coordination of Care  Acuity Level 1-No Barriers  Support Groups/Services Friends and Family  Time Spent with Patient 15

## 2023-07-07 NOTE — Telephone Encounter (Signed)
Please call and let her know that the echocardiogram looks fantastic.  Her heart is still pumping like a champion.  Thanks.  [facility] and informed patient of results, patient verbalized understanding and denies any questions or concerns at this time.

## 2023-07-07 NOTE — Telephone Encounter (Signed)
I called Brandi Roberts about her CT scan.  There is some concern that the breast cancer has recurred.  She has increased right axillary adenopathy.  She had an initial stage II breast cancer of the right breast.  Also noted with some enlarged retroperitoneal lymph nodes.  I told Ms. Christenbury, and also her son, that we are going to have to get a PET scan to see exactly what might be going on.  I explained what a PET scan was.  Also told Ms. Din, and her son, that we probably need to get a biopsy.  I would probably recommend a biopsy of the right axillary nodes.  With the biopsy, we can confirm a diagnosis.  We can confirm the ER/HER2 status.  I can also send off molecular markers.  I just hate this for her.  She completed a year of Kadcyla.  She did not want any chemotherapy when we first saw her.  Her tumor was ER negative.  I know that she has done everything we have asked her to do.  She is trying her best.  We will continue to be aggressive.  She still has a very good performance status so we can be aggressive.  I told them that if they have any questions they can always give her office a call.  Christin Bach,  MD

## 2023-07-08 ENCOUNTER — Other Ambulatory Visit: Payer: Self-pay

## 2023-07-12 ENCOUNTER — Encounter: Payer: Self-pay | Admitting: Hematology & Oncology

## 2023-07-12 ENCOUNTER — Encounter: Payer: Self-pay | Admitting: *Deleted

## 2023-07-12 NOTE — Progress Notes (Signed)
Dr Myna Hidalgo would like a biopsy of her LN and a PET.   Biopsy is scheduled for 07/13/2023. PET is scheduled for 07/27/2023. Will follow for path and results.   Oncology Nurse Navigator Documentation     07/12/2023    9:30 AM  Oncology Nurse Navigator Flowsheets  Navigator Follow Up Date: 07/13/2023  Navigator Follow Up Reason: Other:  Navigator Location [facility]  Navigator Encounter Type Appt/Treatment Plan Review  Patient Visit Type MedOnc  Treatment Phase Post-Tx Follow-up  Barriers/Navigation Needs Coordination of Care  Interventions Other  Acuity Level 1-No Barriers  Coordination of Care Radiology;Appts  Support Groups/Services Friends and Family  Time Spent with Patient 30

## 2023-07-13 ENCOUNTER — Ambulatory Visit
Admission: RE | Admit: 2023-07-13 | Discharge: 2023-07-13 | Disposition: A | Discharge status: Home / Self Care | Payer: Medicare Other | Source: Ambulatory Visit | Attending: Hematology & Oncology | Admitting: Hematology & Oncology

## 2023-07-13 ENCOUNTER — Other Ambulatory Visit: Payer: Self-pay | Admitting: Hematology & Oncology

## 2023-07-13 ENCOUNTER — Encounter: Payer: Self-pay | Admitting: *Deleted

## 2023-07-13 ENCOUNTER — Other Ambulatory Visit: Payer: Self-pay

## 2023-07-13 DIAGNOSIS — R599 Enlarged lymph nodes, unspecified: Secondary | ICD-10-CM

## 2023-07-13 DIAGNOSIS — C50911 Malignant neoplasm of unspecified site of right female breast: Secondary | ICD-10-CM

## 2023-07-13 NOTE — Progress Notes (Addendum)
Patient was unable to get biopsy due to vasculature. Will follow up with Dr Myna Hidalgo to see the next step.  Oncology Nurse Navigator Documentation     07/13/2023    2:15 PM  Oncology Nurse Navigator Flowsheets  Navigator Follow Up Date: 07/17/2023  Navigator Follow Up Reason: Pathology  Navigator Location [facility]  Navigator Encounter Type Appt/Treatment Plan Review  Patient Visit Type MedOnc  Treatment Phase Post-Tx Follow-up  Barriers/Navigation Needs Coordination of Care  Interventions None Required  Acuity Level 1-No Barriers  Support Groups/Services Friends and Family  Time Spent with Patient 15

## 2023-07-14 ENCOUNTER — Encounter: Payer: Self-pay | Admitting: *Deleted

## 2023-07-14 ENCOUNTER — Other Ambulatory Visit: Payer: Self-pay | Admitting: *Deleted

## 2023-07-14 MED ORDER — ALPRAZOLAM 0.25 MG PO TABS: 0.2500 mg | ORAL_TABLET | Freq: Three times a day (TID) | ORAL | 0 refills | Status: DC | PRN

## 2023-07-14 NOTE — Progress Notes (Unsigned)
Spoke to Dr Myna Hidalgo and he would like Dr Magnus Ivan to consider a surgical biopsy. Message sent to MD asking for review.   Called and spoke to patient, Made her aware that we would be talking to Dr Magnus Ivan. She mentions that the Doctor at yesterdays appointment said they weren't ale to biopsy due to clip placement, but the notes we received refers to adjacent vasculature. Patient is very worried about the clip and feels this also needs to be addressed.   Oncology Nurse Navigator Documentation     07/14/2023   12:30 PM  Oncology Nurse Navigator Flowsheets  Navigator Follow Up Date: 07/27/2023  Navigator Follow Up Reason: Scan Review  Navigator Location [facility]  Navigator Encounter Type Telephone  Telephone Outgoing Call  Barriers/Navigation Needs Coordination of Care;Education  Education Other  Interventions Coordination of Care;Education  Acuity Level 1-No Barriers  Coordination of Care Other  Education Method Verbal  Support Groups/Services Friends and Family  Time Spent with Patient 30

## 2023-07-17 ENCOUNTER — Other Ambulatory Visit: Payer: Self-pay

## 2023-07-18 ENCOUNTER — Inpatient Hospital Stay: Payer: Medicare Other

## 2023-07-18 ENCOUNTER — Encounter: Payer: Self-pay | Admitting: Hematology & Oncology

## 2023-07-18 ENCOUNTER — Inpatient Hospital Stay: Payer: Medicare Other | Admitting: Hematology & Oncology

## 2023-07-26 ENCOUNTER — Other Ambulatory Visit: Payer: Self-pay

## 2023-07-27 ENCOUNTER — Encounter (HOSPITAL_COMMUNITY)
Admission: RE | Admit: 2023-07-27 | Discharge: 2023-07-27 | Disposition: A | Discharge status: Home / Self Care | Payer: Medicare Other | Source: Ambulatory Visit | Attending: Hematology & Oncology | Admitting: Hematology & Oncology

## 2023-07-27 DIAGNOSIS — Z171 Estrogen receptor negative status [ER-]: Secondary | ICD-10-CM | POA: Insufficient documentation

## 2023-07-27 DIAGNOSIS — C50911 Malignant neoplasm of unspecified site of right female breast: Secondary | ICD-10-CM | POA: Insufficient documentation

## 2023-07-27 LAB — GLUCOSE, CAPILLARY: Glucose-Capillary: 95 mg/dL (ref 70–99)

## 2023-07-27 MED ORDER — FLUDEOXYGLUCOSE F - 18 (FDG) INJECTION
9.2000 | Freq: Once | INTRAVENOUS | Status: AC
  Administered 2023-07-27: 9.5 via INTRAVENOUS

## 2023-08-01 ENCOUNTER — Ambulatory Visit: Payer: Medicare Other | Admitting: Hematology & Oncology

## 2023-08-01 ENCOUNTER — Other Ambulatory Visit: Payer: Medicare Other

## 2023-08-02 ENCOUNTER — Encounter: Payer: Self-pay | Admitting: *Deleted

## 2023-08-02 NOTE — Progress Notes (Signed)
Reviewed PET which is consistent with disease recurrence in the R axilla.   Oncology Nurse Navigator Documentation     08/02/2023    1:00 PM  Oncology Nurse Navigator Flowsheets  Navigator Follow Up Date: 08/03/2023  Navigator Follow Up Reason: Follow-up Appointment  Navigator Location [facility]  Navigator Encounter Type Scan Review  Patient Visit Type MedOnc  Treatment Phase Post-Tx Follow-up  Barriers/Navigation Needs Coordination of Care;Education  Interventions None Required  Acuity Level 1-No Barriers  Support Groups/Services Friends and Family  Time Spent with Patient 15

## 2023-08-03 ENCOUNTER — Inpatient Hospital Stay: Payer: Medicare Other | Attending: Hematology & Oncology

## 2023-08-03 ENCOUNTER — Inpatient Hospital Stay: Payer: Medicare Other

## 2023-08-03 ENCOUNTER — Encounter: Payer: Self-pay | Admitting: *Deleted

## 2023-08-03 ENCOUNTER — Other Ambulatory Visit: Payer: Self-pay | Admitting: Oncology

## 2023-08-03 ENCOUNTER — Encounter: Payer: Self-pay | Admitting: Hematology & Oncology

## 2023-08-03 ENCOUNTER — Other Ambulatory Visit: Payer: Self-pay | Admitting: *Deleted

## 2023-08-03 ENCOUNTER — Inpatient Hospital Stay (HOSPITAL_BASED_OUTPATIENT_CLINIC_OR_DEPARTMENT_OTHER): Payer: Medicare Other | Admitting: Hematology & Oncology

## 2023-08-03 VITALS — BP 159/59 | HR 63 | Temp 98.2°F | Resp 20 | Ht 66.0 in | Wt 189.2 lb

## 2023-08-03 DIAGNOSIS — Z171 Estrogen receptor negative status [ER-]: Secondary | ICD-10-CM | POA: Diagnosis not present

## 2023-08-03 DIAGNOSIS — Z9011 Acquired absence of right breast and nipple: Secondary | ICD-10-CM | POA: Insufficient documentation

## 2023-08-03 DIAGNOSIS — C50911 Malignant neoplasm of unspecified site of right female breast: Secondary | ICD-10-CM

## 2023-08-03 DIAGNOSIS — E871 Hypo-osmolality and hyponatremia: Secondary | ICD-10-CM

## 2023-08-03 LAB — CBC WITH DIFFERENTIAL (CANCER CENTER ONLY)
Abs Immature Granulocytes: 0.03 10*3/uL (ref 0.00–0.07)
Basophils Absolute: 0.1 10*3/uL (ref 0.0–0.1)
Basophils Relative: 1 %
Eosinophils Absolute: 0.1 10*3/uL (ref 0.0–0.5)
Eosinophils Relative: 1 %
HCT: 32.4 % — ABNORMAL LOW (ref 36.0–46.0)
Hemoglobin: 11.1 g/dL — ABNORMAL LOW (ref 12.0–15.0)
Immature Granulocytes: 0 %
Lymphocytes Relative: 23 %
Lymphs Abs: 2.1 10*3/uL (ref 0.7–4.0)
MCH: 30.8 pg (ref 26.0–34.0)
MCHC: 34.3 g/dL (ref 30.0–36.0)
MCV: 90 fL (ref 80.0–100.0)
Monocytes Absolute: 0.6 10*3/uL (ref 0.1–1.0)
Monocytes Relative: 7 %
Neutro Abs: 6.3 10*3/uL (ref 1.7–7.7)
Neutrophils Relative %: 68 %
Platelet Count: 264 10*3/uL (ref 150–400)
RBC: 3.6 MIL/uL — ABNORMAL LOW (ref 3.87–5.11)
RDW: 12.5 % (ref 11.5–15.5)
WBC Count: 9.2 10*3/uL (ref 4.0–10.5)
nRBC: 0 % (ref 0.0–0.2)

## 2023-08-03 LAB — CMP (CANCER CENTER ONLY)
ALT: 15 U/L (ref 0–44)
AST: 25 U/L (ref 15–41)
Albumin: 3.5 g/dL (ref 3.5–5.0)
Alkaline Phosphatase: 68 U/L (ref 38–126)
Anion gap: 8 (ref 5–15)
BUN: 15 mg/dL (ref 8–23)
CO2: 28 mmol/L (ref 22–32)
Calcium: 9.2 mg/dL (ref 8.9–10.3)
Chloride: 94 mmol/L — ABNORMAL LOW (ref 98–111)
Creatinine: 1.1 mg/dL — ABNORMAL HIGH (ref 0.44–1.00)
GFR, Estimated: 54 mL/min — ABNORMAL LOW (ref >60)
Glucose, Bld: 105 mg/dL — ABNORMAL HIGH (ref 70–99)
Potassium: 3.5 mmol/L (ref 3.5–5.1)
Sodium: 130 mmol/L — ABNORMAL LOW (ref 135–145)
Total Bilirubin: 0.3 mg/dL (ref 0.3–1.2)
Total Protein: 7.3 g/dL (ref 6.5–8.1)

## 2023-08-03 LAB — LACTATE DEHYDROGENASE: LDH: 256 U/L — ABNORMAL HIGH (ref 98–192)

## 2023-08-03 MED ORDER — ALPRAZOLAM 0.25 MG PO TABS: 0.2500 mg | ORAL_TABLET | Freq: Three times a day (TID) | ORAL | 0 refills | Status: DC | PRN

## 2023-08-03 MED ORDER — HEPARIN SOD (PORK) LOCK FLUSH 100 UNIT/ML IV SOLN
500.0000 [IU] | Freq: Once | INTRAVENOUS | Status: AC
  Administered 2023-08-03: 500 [IU] via INTRAVENOUS

## 2023-08-03 MED ORDER — SODIUM CHLORIDE 0.9% FLUSH
10.0000 mL | INTRAVENOUS | Status: DC | PRN
  Administered 2023-08-03: 10 mL via INTRAVENOUS

## 2023-08-03 NOTE — Progress Notes (Signed)
BP remains elevated, has taken all 6 BP meds this morning. Instructed to check at home and if remains over 140/90 to contact PCP. Verbalized understanding.

## 2023-08-03 NOTE — Patient Instructions (Signed)
Implanted [place] Removal, Care After The following information offers guidance on how to care for yourself after your procedure. Your health care provider may also give you more specific instructions. If you have problems or questions, contact your health care provider. What can I expect after the procedure? After the procedure, it is common to have: Soreness or pain near your incision. Some swelling or bruising near your incision. Follow these instructions at home: Medicines Take over-the-counter and prescription medicines only as told by your health care provider. If you were prescribed an antibiotic medicine, take it as told by your health care provider. Do not stop taking the antibiotic even if you start to feel better. Bathing Do not take baths, swim, or use a hot tub until your health care provider approves. Ask your health care provider if you can take showers. You may only be allowed to take sponge baths. Incision care  Follow instructions from your health care provider about how to take care of your incision. Make sure you: Wash your hands with soap and water for at least 20 seconds before and after you change your bandage (dressing). If soap and water are not available, use hand sanitizer. Change your dressing as told by your health care provider. Keep your dressing dry. Leave stitches (sutures), skin glue, or adhesive strips in place. These skin closures may need to stay in place for 2 weeks or longer. If adhesive strip edges start to loosen and curl up, you may trim the loose edges. Do not remove adhesive strips completely unless your health care provider tells you to do that. Check your incision area every day for signs of infection. Check for: More redness, swelling, or pain. More fluid or blood. Warmth. Pus or a bad smell. Activity Return to your normal activities as told by your health care provider. Ask your health care provider what activities are safe for you. You may have  to avoid lifting. Ask your health care provider how much you can safely lift. Do not do activities that involve lifting your arms over your head. Driving  If you were given a sedative during the procedure, it can affect you for several hours. Do not drive or operate machinery until your health care provider says that it is safe. If you did not receive a sedative, ask your health care provider when it is safe to drive. General instructions Do not use any products that contain nicotine or tobacco. These products include cigarettes, chewing tobacco, and vaping devices, such as e-cigarettes. These can delay healing after surgery. If you need help quitting, ask your health care provider. Keep all follow-up visits. This is important. Contact a health care provider if: You have a fever or chills. You have more redness, swelling, or pain around your incision. You have more fluid or blood coming from your incision. Your incision feels warm to the touch. You have pus or a bad smell coming from your incision. You have pain that is not relieved by your pain medicine. Get help right away if: You have chest pain. You have difficulty breathing. These symptoms may be an emergency. Get help right away. Call 911. Do not wait to see if the symptoms will go away. Do not drive yourself to the hospital. Summary After the procedure, it is common to have pain, soreness, swelling, or bruising near your incision. If you were prescribed an antibiotic medicine, take it as told by your health care provider. Do not stop taking the antibiotic even if you   start to feel better. If you were given a sedative during the procedure, it can affect you for several hours. Do not drive or operate machinery until your health care provider says that it is safe. Return to your normal activities as told by your health care provider. Ask your health care provider what activities are safe for you. This information is not intended to  replace advice given to you by your health care provider. Make sure you discuss any questions you have with your health care provider. Document Revised: 06/15/2021 Document Reviewed: 06/15/2021 [employer] Patient Education  2024 [employer]  

## 2023-08-03 NOTE — Progress Notes (Signed)
Hematology and Oncology Follow Up Visit  Shelba Roso [id number] Nov 22, 1956 67 y.o. 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 Steff is here today for follow-up.  Unfortunately, looks like we may have a problem with recurrent disease.  I had done a follow-up CT scan on her after she completed the Kadcyla.  Surprisingly, the CT scan showed that there was a large right axillary lymph node measuring 2.3 x 1.9 cm.  We subsequently did a PET scan on her.  This was done on 07/27/2023.  The PET scan showed that there is a hypermetabolic lymph node in the right axilla.  An SUV of 20.8.  Everything else looks fine without any obvious evidence of metastatic disease.  She feels okay.  She cannot feel any swelling in the right axilla.  She has had no pain in the right arm.  There is no shoulder pain in the right shoulder.  She has had no cough or shortness of breath.  She has had no nausea or vomiting.  She has had no change in bowel or bladder habits.  There is been no headache.  Her appetite has been good.  She has had no fever.  Of note, she had echocardiogram done on 07/06/2023.  This showed a left ventricular ejection fraction of 55-60%.  Overall, I would say that her performance status is probably ECOG 1.    Medications:  Allergies as of 08/03/2023       Reactions   Levaquin [levofloxacin] Nausea And Vomiting, Other (See Comments)   dizziness        Medication List        Accurate as of August 03, 2023 10:54 AM. If you have any questions, ask your nurse or doctor.          STOP taking these medications    HYDROcodone-acetaminophen 5-325 MG tablet Commonly known as: NORCO/VICODIN Stopped by: Rose Phi     ondansetron 4 MG disintegrating tablet Commonly known as: ZOFRAN-ODT Stopped by: Josph Macho       TAKE these medications    albuterol 108 (90 Base) MCG/ACT inhaler Commonly known as: VENTOLIN HFA Inhale 2 puffs into the lungs every 6 (six) hours as needed.   ALPRAZolam 0.25 MG tablet Commonly known as: XANAX Take 1 tablet (0.25 mg total) by mouth 3 (three) times daily as needed for anxiety.   amLODipine 10 MG tablet Commonly known as: NORVASC Take 10 mg by mouth daily.   atorvastatin 10 MG tablet Commonly known as: LIPITOR Take 5 mg by mouth at bedtime.   benazepril 40 MG tablet Commonly known as: LOTENSIN Take 40 mg by mouth daily.   doxazosin 2 MG tablet Commonly known as: CARDURA Take 2 mg by mouth 2 (two) times daily.   Eliquis 5 MG Tabs tablet Generic drug: apixaban Take 5 mg by mouth 2 (two) times daily.   gabapentin 600 MG tablet Commonly known as: NEURONTIN Take 600 mg by mouth at bedtime.   hydrALAZINE 50 MG tablet Commonly known as: APRESOLINE Take 50 mg by mouth 2 (two) times daily.   Iron 325 (65 Fe) MG Tabs Take 1 tablet by mouth daily.   metFORMIN 1000 MG tablet Commonly known as: GLUCOPHAGE Take  1,000 mg by mouth 2 (two) times daily.   metoprolol succinate 100 MG 24 hr tablet Commonly known as: TOPROL-XL Take 100 mg by mouth daily.   sertraline 25 MG tablet Commonly known as: ZOLOFT Take 1 tablet (25 mg total) by mouth daily.   triamterene-hydrochlorothiazide 75-50 MG tablet Commonly known as: MAXZIDE Take 1 tablet by mouth daily.   Vitamin D (Ergocalciferol) 1.25 MG (50000 UNIT) Caps capsule Commonly known as: DRISDOL Take 50,000 Units by mouth once a week.        Allergies:  Allergies  Allergen Reactions   Levaquin [Levofloxacin] Nausea And Vomiting and Other (See Comments)    dizziness    Past Medical History, Surgical history, Social history, and Family History were reviewed and updated.  Review of  Systems: Review of Systems  Constitutional: Negative.   HENT: Negative.    Eyes: Negative.   Respiratory: Negative.    Cardiovascular: Negative.   Gastrointestinal: Negative.   Genitourinary: Negative.   Musculoskeletal: Negative.   Skin: Negative.   Neurological: Negative.   Endo/Heme/Allergies: Negative.   Psychiatric/Behavioral: Negative.       Physical Exam:  height is 5\' 6"  (1.676 m) and weight is 189 lb 3.2 oz (85.8 kg). Her oral temperature is 98.2 F (36.8 C). Her blood pressure is 159/59 (abnormal) and her pulse is 63. Her respiration is 20 and oxygen saturation is 100%.   Wt Readings from Last 3 Encounters:  08/03/23 189 lb 3.2 oz (85.8 kg)  06/06/23 184 lb (83.5 kg)  05/19/23 187 lb 1.9 oz (84.9 kg)    Physical Exam Vitals reviewed.  Constitutional:      Comments: Her breast exam shows left breast with no masses, edema or erythema.  There is no left axillary adenopathy.  Right chest wall shows healed mastectomy scar.  There is little bit of hyperpigmentation.  She has had no masses.  There is no erythema.  There is no right axillary adenopathy.  HENT:     Head: Normocephalic and atraumatic.  Eyes:     Pupils: Pupils are equal, round, and reactive to light.  Cardiovascular:     Rate and Rhythm: Normal rate and regular rhythm.     Heart sounds: Normal heart sounds.  Pulmonary:     Effort: Pulmonary effort is normal.     Breath sounds: Normal breath sounds.  Abdominal:     General: Bowel sounds are normal.     Palpations: Abdomen is soft.  Musculoskeletal:        General: No tenderness or deformity. Normal range of motion.     Cervical back: Normal range of motion.  Lymphadenopathy:     Cervical: No cervical adenopathy.  Skin:    General: Skin is warm and dry.     Findings: No erythema or rash.  Neurological:     Mental Status: She is alert and oriented to person, place, and time.  Psychiatric:        Behavior: Behavior normal.        Thought Content:  Thought content normal.        Judgment: Judgment normal.     Lab Results  Component Value Date   WBC 7.9 06/06/2023   HGB 10.8 (L) 06/06/2023   HCT 30.4 (L) 06/06/2023   MCV 86.6 06/06/2023   PLT 247 06/06/2023   Lab Results  Component Value Date   FERRITIN 258 01/02/2023   IRON 105 01/02/2023   TIBC 354 01/02/2023   UIBC 249 01/02/2023  IRONPCTSAT 30 01/02/2023   Lab Results  Component Value Date   RBC 3.51 (L) 06/06/2023   No results found for: "KPAFRELGTCHN", "LAMBDASER", "KAPLAMBRATIO" No results found for: "IGGSERUM", "IGA", "IGMSERUM" No results found for: "TOTALPROTELP", "ALBUMINELP", "A1GS", "A2GS", "BETS", "BETA2SER", "GAMS", "MSPIKE", "SPEI"   Chemistry      Component Value Date/Time   NA 128 (L) 06/06/2023 1029   K 3.4 (L) 06/06/2023 1029   CL 93 (L) 06/06/2023 1029   CO2 27 06/06/2023 1029   BUN 18 06/06/2023 1029   CREATININE 1.15 (H) 06/06/2023 1029      Component Value Date/Time   CALCIUM 9.5 06/06/2023 1029   ALKPHOS 55 06/06/2023 1029   AST 61 (H) 06/06/2023 1029   ALT 30 06/06/2023 1029   BILITOT 0.4 06/06/2023 1029       Impression and Plan: Ms. Zwieg is a pleasant 67 yo African American female with invasive ductal carcinoma of the right breast, ER-/PR-/HER2 + treated with mastectomy.   She had 1 positive lymph node.  Unfortunately, I think that we do have recurrence.  Thankfully, it seems that the recurrence is localized to the right axilla.  Know that she has had past surgery and radiation to the right axilla.  This was probably 20 years ago.  We will see if Dr. Magnus Ivan, who did her mastectomy would feel that a right axillary lymphadenectomy would be reasonable.  If this could be done, she will still need systemic therapy.  I know that we try to talk to her about chemotherapy and anti-HER2 therapy when we try to treat her in the adjuvant setting.  She just was not willing to take chemotherapy.  I think that she definitely would be  willing to take chemotherapy.  Given the fact that she has ER negative/PR negative disease, we might also consider immunotherapy along with chemotherapy and possibly anti-HER2 therapy.  We definitely will need to send off any new material for molecular studies.  She apparently will see Dr. Magnus Ivan on August 13.  If he feels that she would benefit from a completion axillary lymphadenectomy, hopefully this can be done before Labor Day.  I spoke to her family today also.  I gave them my recommendations.  I gave them my thoughts as to what was going on and what we would do.  We will plan to get her back after she sees Dr. Magnus Ivan and any surgery date will be determined.    Josph Macho, MD 8/8/202410:54 AM

## 2023-08-03 NOTE — Progress Notes (Signed)
Patient completed PET which likely confirms disease activity in the axilla. She is seeing Dr Magnus Ivan next week for discussion on surgical excision. Will follow for plan.  Oncology Nurse Navigator Documentation     08/03/2023   11:00 AM  Oncology Nurse Navigator Flowsheets  Navigator Follow Up Date: 08/08/2023  Navigator Follow Up Reason: Review Note  Navigator Location [facility]  Navigator Encounter Type Follow-up Appt  Patient Visit Type MedOnc  Treatment Phase Post-Tx Follow-up  Barriers/Navigation Needs Coordination of Care;Education  Interventions Coordination of Care  Acuity Level 1-No Barriers  Coordination of Care Other  Support Groups/Services Friends and Family  Time Spent with Patient 15

## 2023-08-08 ENCOUNTER — Other Ambulatory Visit: Payer: Self-pay | Admitting: Surgery

## 2023-08-09 ENCOUNTER — Encounter: Payer: Self-pay | Admitting: *Deleted

## 2023-08-09 NOTE — Progress Notes (Unsigned)
Patient saw Dr Magnus Ivan and will proceed with excisional LN biopsy. Will follow for surgical date.   Oncology Nurse Navigator Documentation     08/09/2023    8:00 AM  Oncology Nurse Navigator Flowsheets  Navigator Follow Up Date: 08/23/2023  Navigator Follow Up Reason: Surgery  Navigator Location [facility]  Navigator Encounter Type Appt/Treatment Plan Review  Patient Visit Type MedOnc  Treatment Phase Post-Tx Follow-up  Barriers/Navigation Needs Coordination of Care;Education  Interventions None Required  Acuity Level 1-No Barriers  Support Groups/Services Friends and Family  Time Spent with Patient 15

## 2023-08-10 ENCOUNTER — Encounter: Payer: Self-pay | Admitting: Hematology & Oncology

## 2023-08-10 ENCOUNTER — Other Ambulatory Visit: Payer: Self-pay

## 2023-08-16 ENCOUNTER — Encounter (HOSPITAL_BASED_OUTPATIENT_CLINIC_OR_DEPARTMENT_OTHER): Payer: Self-pay | Admitting: Surgery

## 2023-08-16 NOTE — Progress Notes (Signed)
   08/16/23 1016  PAT Phone Screen  Is the patient taking a GLP-1 receptor agonist? No  Do You Have Diabetes? Yes  Do You Have Hypertension? Yes  Have You Ever Been to the ER for Asthma? No  Have You Taken Oral Steroids in the Past 3 Months? No  Do you Take Phenteramine or any Other Diet Drugs? No  Recent  Lab Work, EKG, CXR? Yes  Where was this test performed? blood work, echo (07/06/23)  Do you have a history of heart problems? No  Have you ever had tests on your heart? Yes  What cardiac tests were performed? Echo;Labs  Results viewable: Care Everywhere  Any Recent Hospitalizations? No  Height 5\' 6"  (1.676 m)  Weight 86.2 kg  Pat Appointment Scheduled Yes   Patients NA has been running low in the last few months Per Dr. Hyacinth Meeker repeat before scheduled surgery.

## 2023-08-18 ENCOUNTER — Encounter (HOSPITAL_BASED_OUTPATIENT_CLINIC_OR_DEPARTMENT_OTHER)
Admission: RE | Admit: 2023-08-18 | Discharge: 2023-08-18 | Disposition: A | Discharge status: Home / Self Care | Payer: Medicare Other | Source: Ambulatory Visit | Attending: Surgery | Admitting: Surgery

## 2023-08-18 DIAGNOSIS — Z01818 Encounter for other preprocedural examination: Secondary | ICD-10-CM | POA: Insufficient documentation

## 2023-08-18 LAB — BASIC METABOLIC PANEL
Anion gap: 7 (ref 5–15)
BUN: 15 mg/dL (ref 8–23)
CO2: 26 mmol/L (ref 22–32)
Calcium: 8.7 mg/dL — ABNORMAL LOW (ref 8.9–10.3)
Chloride: 99 mmol/L (ref 98–111)
Creatinine, Ser: 1.14 mg/dL — ABNORMAL HIGH (ref 0.44–1.00)
GFR, Estimated: 52 mL/min — ABNORMAL LOW (ref >60)
Glucose, Bld: 90 mg/dL (ref 70–99)
Potassium: 4 mmol/L (ref 3.5–5.1)
Sodium: 132 mmol/L — ABNORMAL LOW (ref 135–145)

## 2023-08-18 NOTE — Progress Notes (Signed)
EKG reviewed with Dr. Jean Rosenthal and acceptable for surgery per aforesaid.

## 2023-08-18 NOTE — Progress Notes (Signed)
      Enhanced Recovery after Surgery  Enhanced Recovery after Surgery is a protocol used to improve the stress on your body and your recovery after surgery.  Patient Instructions  The night before surgery:  No food after midnight. ONLY clear liquids after midnight  The day of surgery (if you do NOT have diabetes):  Drink ONE (1) Pre-Surgery Clear Ensure as directed.   This drink was given to you during your hospital  pre-op appointment visit. The pre-op nurse will instruct you on the time to drink the  Pre-Surgery Ensure depending on your surgery time. Finish the drink at the designated time by the pre-op nurse.  Nothing else to drink after completing the  Pre-Surgery Clear Ensure.  The day of surgery (if you have diabetes): Drink ONE (1) Gatorade 2 (G2) as directed. This drink was given to you during your hospital  pre-op appointment visit.  The pre-op nurse will instruct you on the time to drink the   Gatorade 2 (G2) depending on your surgery time. Color of the Gatorade may vary. Red is not allowed. Nothing else to drink after completing the  Gatorade 2 (G2).         If you have questions, please contact your surgeon's office.  Pre-surgical soap and instructions given as well.

## 2023-08-22 NOTE — H&P (Signed)
PROVIDER: Wayne Both, MD  MRN: [id number] DOB: 08/07/1956  Subjective   Chief Complaint: Follow-up ( NEW PROBLEM - F/u after Pet Scan, 07/27/23/)   History of Present Illness: Brandi Roberts is a 67 y.o. female who is seen for right axillary lymphadenopathy. Again she has a history of breast cancer and in March of last year underwent a right total mastectomy. This was originally for DCIS but then she was found to have an invasive cancer on the final pathology. She has had a previous history of right breast cancer. We ended up doing a sentinel node biopsy in April of last year which was positive for malignancy. She has recently had a PET CT scan showing an enlarged 2.3 cm lymph node in her axilla on the right side worrisome for recurrent cancer. She has currently been doing well and has no complaints. Oncology has requested removal of this lymph node..    Review of Systems: A complete review of systems was obtained from the patient. I have reviewed this information and discussed as appropriate with the patient. See HPI as well for other ROS.  ROS   Medical History: Past Medical History:  Diagnosis Date  Arthritis  Asthma, unspecified asthma severity, unspecified whether complicated, unspecified whether persistent (HHS-HCC)  Diabetes mellitus without complication (CMS/HHS-HCC)  DVT (deep venous thrombosis) (CMS/HHS-HCC)  GERD (gastroesophageal reflux disease)  History of cancer  Hypertension  Thyroid disease   Patient Active Problem List  Diagnosis  DVT (deep venous thrombosis) (CMS/HHS-HCC)  S/P mastectomy, right  Goals of care, counseling/discussion  Stage II carcinoma of breast, ER-, right (CMS/HHS-HCC)   Past Surgical History:  Procedure Laterality Date  MASTECTOMY PARTIAL Right 03/22/2022  Right Axillary Sentinel Lymph Node Biopsy 04/14/2022  Dr. Barrie Dunker  CHOLECYSTECTOMY    Allergies  Allergen Reactions  Levofloxacin Nausea And Vomiting and Other  (See Comments)  dizziness   Current Outpatient Medications on File Prior to Visit  Medication Sig Dispense Refill  amLODIPine (NORVASC) 10 MG tablet Take 10 mg by mouth once daily  apixaban (ELIQUIS) 5 mg tablet Take 5 mg by mouth 2 (two) times daily  atorvastatin (LIPITOR) 10 MG tablet Take 5 mg by mouth at bedtime  benazepriL (LOTENSIN) 40 MG tablet Take 40 mg by mouth once daily  doxazosin (CARDURA) 2 MG tablet Take by mouth  ergocalciferol, vitamin D2, 1,250 mcg (50,000 unit) capsule  hydrALAZINE (APRESOLINE) 50 MG tablet Take 50 mg by mouth 2 (two) times daily  metFORMIN (GLUCOPHAGE) 1000 MG tablet Take 1,000 mg by mouth 2 (two) times daily  metoprolol succinate (TOPROL-XL) 100 MG XL tablet Take 100 mg by mouth once daily  triamterene-hydrochlorothiazide (MAXZIDE) 75-50 mg tablet  oxyCODONE-acetaminophen (PERCOCET) 5-325 mg tablet TAKE 1 TABLET BY MOUTH EVERY 6 (SIX) HOURS AS NEEDED FOR UP TO 8 DOSES FOR SEVERE PAIN (Patient not taking: Reported on 08/08/2023)   No current facility-administered medications on file prior to visit.   History reviewed. No pertinent family history.   Social History   Tobacco Use  Smoking Status Never  Smokeless Tobacco Never    Social History   Socioeconomic History  Marital status: Married  Tobacco Use  Smoking status: Never  Smokeless tobacco: Never  Vaping Use  Vaping status: Never Used  Substance and Sexual Activity  Alcohol use: Never  Drug use: Never   Social Determinants of Health   Financial Resource Strain: High Risk (03/02/2023)  Received from [facility]  Overall Financial Resource Strain (CARDIA)  Difficulty  of Paying Living Expenses: Hard  Food Insecurity: No Food Insecurity (03/02/2023)  [facility]  Hunger Vital Sign  Worried About Running Out of Food in the Last Year: Never true  Ran Out of Food in the Last Year: Never true  Transportation Needs: No Transportation Needs (03/02/2023)  Received [facility]  Lack of Transportation (Medical): No  Lack of Transportation (Non-Medical): No   Objective:   There were no vitals filed for this visit.  There is no height or weight on file to calculate BMI.  Physical Exam   She appears well on exam  I Cannot palpate the large lymph node in the right axilla. Her mastectomy incision on the right side is well-healed.  Labs, Imaging and Diagnostic Testing:  I reviewed the CT scan and PET scan showing the 2.3 x 1.9 cm lymph node in her right axilla  Assessment and Plan:   Diagnoses and all orders for this visit:  Lymphadenopathy, axillary    At this point, complete surgical excision of the lymph node in the axilla is recommended for complete histologic evaluation and also has an attempt to prevent further current cancer in her axilla. I explained the surgical procedure to the patient and her husband. We discussed the risks which include but is not limited to bleeding, infection, injury to surrounding structures, seroma formation, cardiopulmonary issues, etc. She will stop her blood thinning medication 2 days preoperatively and we will start her back the day after surgery. She understands and agrees with plans

## 2023-08-23 ENCOUNTER — Encounter: Payer: Self-pay | Admitting: *Deleted

## 2023-08-23 ENCOUNTER — Other Ambulatory Visit: Payer: Self-pay

## 2023-08-23 ENCOUNTER — Encounter (HOSPITAL_BASED_OUTPATIENT_CLINIC_OR_DEPARTMENT_OTHER)
Admission: RE | Disposition: A | Discharge status: Home / Self Care | Payer: Self-pay | Source: Home / Self Care | Attending: Surgery

## 2023-08-23 ENCOUNTER — Ambulatory Visit (HOSPITAL_BASED_OUTPATIENT_CLINIC_OR_DEPARTMENT_OTHER): Payer: Medicare Other | Admitting: Anesthesiology

## 2023-08-23 ENCOUNTER — Encounter (HOSPITAL_BASED_OUTPATIENT_CLINIC_OR_DEPARTMENT_OTHER): Payer: Self-pay | Admitting: Surgery

## 2023-08-23 ENCOUNTER — Ambulatory Visit (HOSPITAL_BASED_OUTPATIENT_CLINIC_OR_DEPARTMENT_OTHER)
Admission: RE | Admit: 2023-08-23 | Discharge: 2023-08-23 | Disposition: A | Discharge status: Home / Self Care | Payer: Medicare Other | Attending: Surgery | Admitting: Surgery

## 2023-08-23 DIAGNOSIS — E119 Type 2 diabetes mellitus without complications: Secondary | ICD-10-CM

## 2023-08-23 DIAGNOSIS — R590 Localized enlarged lymph nodes: Secondary | ICD-10-CM

## 2023-08-23 DIAGNOSIS — Z7984 Long term (current) use of oral hypoglycemic drugs: Secondary | ICD-10-CM | POA: Diagnosis not present

## 2023-08-23 DIAGNOSIS — C773 Secondary and unspecified malignant neoplasm of axilla and upper limb lymph nodes: Secondary | ICD-10-CM | POA: Insufficient documentation

## 2023-08-23 DIAGNOSIS — I10 Essential (primary) hypertension: Secondary | ICD-10-CM

## 2023-08-23 DIAGNOSIS — Z86718 Personal history of other venous thrombosis and embolism: Secondary | ICD-10-CM | POA: Diagnosis not present

## 2023-08-23 DIAGNOSIS — Z79899 Other long term (current) drug therapy: Secondary | ICD-10-CM | POA: Diagnosis not present

## 2023-08-23 DIAGNOSIS — C50911 Malignant neoplasm of unspecified site of right female breast: Secondary | ICD-10-CM | POA: Insufficient documentation

## 2023-08-23 DIAGNOSIS — Z9011 Acquired absence of right breast and nipple: Secondary | ICD-10-CM | POA: Insufficient documentation

## 2023-08-23 DIAGNOSIS — Z01818 Encounter for other preprocedural examination: Secondary | ICD-10-CM

## 2023-08-23 DIAGNOSIS — Z87891 Personal history of nicotine dependence: Secondary | ICD-10-CM | POA: Insufficient documentation

## 2023-08-23 HISTORY — PX: AXILLARY LYMPH NODE BIOPSY: SHX5737

## 2023-08-23 LAB — GLUCOSE, CAPILLARY
Glucose-Capillary: 97 mg/dL (ref 70–99)
Glucose-Capillary: 85 mg/dL (ref 70–99)

## 2023-08-23 SURGERY — AXILLARY LYMPH NODE BIOPSY
Anesthesia: General | Site: Breast | Laterality: Right

## 2023-08-23 MED ORDER — CHLORHEXIDINE GLUCONATE CLOTH 2 % EX PADS: 6.0000 | MEDICATED_PAD | Freq: Once | CUTANEOUS | Status: DC

## 2023-08-23 MED ORDER — ONDANSETRON HCL 4 MG/2ML IJ SOLN
INTRAMUSCULAR | Status: DC | PRN
Start: 2023-08-23 — End: 2023-08-23
  Administered 2023-08-23: 4 mg via INTRAVENOUS

## 2023-08-23 MED ORDER — BUPIVACAINE HCL (PF) 0.25 % IJ SOLN: INTRAMUSCULAR | Status: DC | PRN

## 2023-08-23 MED ORDER — BUPIVACAINE-EPINEPHRINE (PF) 0.5% -1:200000 IJ SOLN
INTRAMUSCULAR | Status: AC
  Filled 2023-08-23: qty 30

## 2023-08-23 MED ORDER — MIDAZOLAM HCL 2 MG/2ML IJ SOLN
INTRAMUSCULAR | Status: AC
  Filled 2023-08-23: qty 2

## 2023-08-23 MED ORDER — FENTANYL CITRATE (PF) 100 MCG/2ML IJ SOLN
INTRAMUSCULAR | Status: AC
  Filled 2023-08-23: qty 2

## 2023-08-23 MED ORDER — FENTANYL CITRATE (PF) 100 MCG/2ML IJ SOLN
INTRAMUSCULAR | Status: DC | PRN
  Administered 2023-08-23: 50 ug via INTRAVENOUS

## 2023-08-23 MED ORDER — ACETAMINOPHEN 500 MG PO TABS
ORAL_TABLET | ORAL | Status: AC
  Filled 2023-08-23: qty 2

## 2023-08-23 MED ORDER — DEXAMETHASONE SODIUM PHOSPHATE 10 MG/ML IJ SOLN
INTRAMUSCULAR | Status: DC | PRN
  Administered 2023-08-23: 5 mg via INTRAVENOUS

## 2023-08-23 MED ORDER — FENTANYL CITRATE (PF) 100 MCG/2ML IJ SOLN
25.0000 ug | INTRAMUSCULAR | Status: DC | PRN
  Administered 2023-08-23 (×3): 50 ug via INTRAVENOUS

## 2023-08-23 MED ORDER — ACETAMINOPHEN 500 MG PO TABS
1000.0000 mg | ORAL_TABLET | Freq: Once | ORAL | Status: AC
  Administered 2023-08-23: 1000 mg via ORAL

## 2023-08-23 MED ORDER — EPHEDRINE SULFATE (PRESSORS) 50 MG/ML IJ SOLN
INTRAMUSCULAR | Status: DC | PRN
  Administered 2023-08-23: 10 mg via INTRAVENOUS

## 2023-08-23 MED ORDER — CEFAZOLIN SODIUM-DEXTROSE 2-4 GM/100ML-% IV SOLN: 2.0000 g | INTRAVENOUS | Status: DC

## 2023-08-23 MED ORDER — OXYCODONE HCL 5 MG PO TABS
5.0000 mg | ORAL_TABLET | Freq: Once | ORAL | Status: AC | PRN
  Administered 2023-08-23: 5 mg via ORAL

## 2023-08-23 MED ORDER — 0.9 % SODIUM CHLORIDE (POUR BTL) OPTIME
TOPICAL | Status: DC | PRN
  Administered 2023-08-23: 100 mL

## 2023-08-23 MED ORDER — BUPIVACAINE-EPINEPHRINE 0.5% -1:200000 IJ SOLN
INTRAMUSCULAR | Status: DC | PRN
  Administered 2023-08-23: 4 mL

## 2023-08-23 MED ORDER — CEFAZOLIN SODIUM-DEXTROSE 2-3 GM-%(50ML) IV SOLR
INTRAVENOUS | Status: DC | PRN
  Administered 2023-08-23: 2 g via INTRAVENOUS

## 2023-08-23 MED ORDER — LACTATED RINGERS IV SOLN: INTRAVENOUS | Status: DC

## 2023-08-23 MED ORDER — AMISULPRIDE (ANTIEMETIC) 5 MG/2ML IV SOLN: 10.0000 mg | Freq: Once | INTRAVENOUS | Status: DC | PRN

## 2023-08-23 MED ORDER — BUPIVACAINE HCL (PF) 0.25 % IJ SOLN
INTRAMUSCULAR | Status: DC | PRN
  Administered 2023-08-23: 20 mL via PERINEURAL
  Administered 2023-08-23: 10 mL via PERINEURAL

## 2023-08-23 MED ORDER — ACETAMINOPHEN 500 MG PO TABS: 1000.0000 mg | ORAL_TABLET | ORAL | Status: AC

## 2023-08-23 MED ORDER — PROPOFOL 10 MG/ML IV BOLUS
INTRAVENOUS | Status: AC
  Filled 2023-08-23: qty 20

## 2023-08-23 MED ORDER — OXYCODONE HCL 5 MG PO TABS
ORAL_TABLET | ORAL | Status: AC
  Filled 2023-08-23: qty 1

## 2023-08-23 MED ORDER — OXYCODONE HCL 5 MG/5ML PO SOLN: 5.0000 mg | Freq: Once | ORAL | Status: AC | PRN

## 2023-08-23 MED ORDER — PROPOFOL 10 MG/ML IV BOLUS
INTRAVENOUS | Status: DC | PRN
  Administered 2023-08-23: 150 mg via INTRAVENOUS

## 2023-08-23 MED ORDER — ONDANSETRON HCL 4 MG/2ML IJ SOLN
INTRAMUSCULAR | Status: AC
  Filled 2023-08-23: qty 2

## 2023-08-23 MED ORDER — OXYCODONE HCL 5 MG PO TABS: 5.0000 mg | ORAL_TABLET | Freq: Four times a day (QID) | ORAL | 0 refills | Status: DC | PRN

## 2023-08-23 MED ORDER — CEFAZOLIN SODIUM-DEXTROSE 2-4 GM/100ML-% IV SOLN
INTRAVENOUS | Status: AC
  Filled 2023-08-23: qty 100

## 2023-08-23 MED ORDER — MIDAZOLAM HCL 2 MG/2ML IJ SOLN
2.0000 mg | Freq: Once | INTRAMUSCULAR | Status: AC
  Administered 2023-08-23: 2 mg via INTRAVENOUS

## 2023-08-23 SURGICAL SUPPLY — 45 items
ADH SKN CLS APL DERMABOND .7 (GAUZE/BANDAGES/DRESSINGS) ×1
APL PRP STRL LF DISP 70% ISPRP (MISCELLANEOUS) ×1
APPLIER CLIP 9.375 MED OPEN (MISCELLANEOUS) ×1
APR CLP MED 9.3 20 MLT OPN (MISCELLANEOUS) ×1
BLADE SURG 15 STRL LF DISP TIS (BLADE) ×1 IMPLANT
BLADE SURG 15 STRL SS (BLADE) ×1
CANISTER SUCT 1200ML W/VALVE (MISCELLANEOUS) ×1 IMPLANT
CHLORAPREP W/TINT 26 (MISCELLANEOUS) ×1 IMPLANT
CLIP APPLIE 9.375 MED OPEN (MISCELLANEOUS) IMPLANT
COVER BACK TABLE 60X90IN (DRAPES) ×1 IMPLANT
COVER MAYO STAND STRL (DRAPES) ×1 IMPLANT
DERMABOND ADVANCED .7 DNX12 (GAUZE/BANDAGES/DRESSINGS) ×2 IMPLANT
DRAIN CHANNEL 19F RND (DRAIN) IMPLANT
DRAIN PENROSE 12X.25 LTX STRL (MISCELLANEOUS) IMPLANT
DRAIN RELI 100 BL SUC LF ST (DRAIN)
DRAPE LAPAROSCOPIC ABDOMINAL (DRAPES) IMPLANT
DRAPE LAPAROTOMY 100X72 PEDS (DRAPES) ×1 IMPLANT
DRAPE UTILITY XL STRL (DRAPES) ×1 IMPLANT
ELECT REM PT RETURN 9FT ADLT (ELECTROSURGICAL) ×1
ELECTRODE REM PT RTRN 9FT ADLT (ELECTROSURGICAL) ×1 IMPLANT
EVACUATOR SILICONE 100CC (DRAIN) IMPLANT
GLOVE SURG SIGNA 7.5 PF LTX (GLOVE) ×1 IMPLANT
GOWN STRL REUS W/ TWL LRG LVL3 (GOWN DISPOSABLE) ×1 IMPLANT
GOWN STRL REUS W/ TWL XL LVL3 (GOWN DISPOSABLE) ×1 IMPLANT
GOWN STRL REUS W/TWL LRG LVL3 (GOWN DISPOSABLE) ×2
GOWN STRL REUS W/TWL XL LVL3 (GOWN DISPOSABLE) ×1
NDL HYPO 25X1 1.5 SAFETY (NEEDLE) ×1 IMPLANT
NEEDLE HYPO 25X1 1.5 SAFETY (NEEDLE) ×1
NS IRRIG 1000ML POUR BTL (IV SOLUTION) ×1 IMPLANT
PACK BASIN DAY SURGERY FS (CUSTOM PROCEDURE TRAY) ×1 IMPLANT
PENCIL SMOKE EVACUATOR (MISCELLANEOUS) ×1 IMPLANT
PIN SAFETY STERILE (MISCELLANEOUS) ×1 IMPLANT
SLEEVE SCD COMPRESS KNEE MED (STOCKING) ×1 IMPLANT
SPIKE FLUID TRANSFER (MISCELLANEOUS) IMPLANT
SPONGE T-LAP 4X18 ~~LOC~~+RFID (SPONGE) ×1 IMPLANT
STAPLER VISISTAT 35W (STAPLE) IMPLANT
SUT ETHILON 2 0 FS 18 (SUTURE) IMPLANT
SUT MNCRL AB 4-0 PS2 18 (SUTURE) ×1 IMPLANT
SUT VIC AB 3-0 SH 27 (SUTURE) ×1
SUT VIC AB 3-0 SH 27X BRD (SUTURE) IMPLANT
SYR BULB EAR ULCER 3OZ GRN STR (SYRINGE) IMPLANT
SYR CONTROL 10ML LL (SYRINGE) ×1 IMPLANT
TOWEL GREEN STERILE FF (TOWEL DISPOSABLE) ×1 IMPLANT
TUBE CONNECTING 20X1/4 (TUBING) ×1 IMPLANT
YANKAUER SUCT BULB TIP NO VENT (SUCTIONS) ×1 IMPLANT

## 2023-08-23 NOTE — Anesthesia Procedure Notes (Signed)
Procedure Name: LMA Insertion Date/Time: 08/23/2023 11:49 AM  Performed by: Karen Kitchens, CRNAPre-anesthesia Checklist: Patient identified, Emergency Drugs available, Suction available and Patient being monitored Patient Re-evaluated:Patient Re-evaluated prior to induction Oxygen Delivery Method: Circle system utilized Preoxygenation: Pre-oxygenation with 100% oxygen Induction Type: IV induction Ventilation: Mask ventilation without difficulty LMA: LMA inserted LMA Size: 4.0 Number of attempts: 1 Airway Equipment and Method: Bite block Placement Confirmation: positive ETCO2, breath sounds checked- equal and bilateral and CO2 detector Tube secured with: Tape Dental Injury: Teeth and Oropharynx as per pre-operative assessment

## 2023-08-23 NOTE — Interval H&P Note (Signed)
History and Physical Interval Note:no change in H and P  08/23/2023 10:55 AM  Brandi Roberts  has presented today for surgery, with the diagnosis of HISTORY OF RIGHT BREAST CANCER, RIGHT AXILLARY LYMPHADENOPATHY.  The various methods of treatment have been discussed with the patient and family. After consideration of risks, benefits and other options for treatment, the patient has consented to  Procedure(s) with comments: EXCISIONAL BIOPSY RIGHT AXILLARY LYMPH NODE (Right) - LMA PEC BLOCK as a surgical intervention.  The patient's history has been reviewed, patient examined, no change in status, stable for surgery.  I have reviewed the patient's chart and labs.  Questions were answered to the patient's satisfaction.     Abigail Miyamoto

## 2023-08-23 NOTE — Progress Notes (Signed)
Assisted Dr. Jennifer Allan with right, pectoralis, ultrasound guided block. Side rails up, monitors on throughout procedure. See vital signs in flow sheet. Tolerated Procedure well. 

## 2023-08-23 NOTE — Progress Notes (Signed)
Patient had deep exicional LN biopsy today. Will follow for path.   Oncology Nurse Navigator Documentation     08/23/2023   12:30 PM  Oncology Nurse Navigator Flowsheets  Navigator Follow Up Date: 08/25/2023  Navigator Follow Up Reason: Pathology  Navigator Location [facility]  Navigator Encounter Type Appt/Treatment Plan Review  Patient Visit Type MedOnc  Treatment Phase Post-Tx Follow-up  Barriers/Navigation Needs Coordination of Care;Education  Interventions None Required  Acuity Level 1-No Barriers  Support Groups/Services Friends and Family  Time Spent with Patient 15

## 2023-08-23 NOTE — Discharge Instructions (Addendum)
Ok to shower starting tomorrow  Ice pack, tylenol, and ibuprofen also for pain  No vigorous activity for one week  You may shower starting tomorrow   Post Anesthesia Home Care Instructions  Activity: Get plenty of rest for the remainder of the day. A responsible individual must stay with you for 24 hours following the procedure.  For the next 24 hours, DO NOT: -Drive a car -Advertising copywriter -Drink alcoholic beverages -Take any medication unless instructed by your physician -Make any legal decisions or sign important papers.  Meals: Start with liquid foods such as gelatin or soup. Progress to regular foods as tolerated. Avoid greasy, spicy, heavy foods. If nausea and/or vomiting occur, drink only clear liquids until the nausea and/or vomiting subsides. Call your physician if vomiting continues.  Special Instructions/Symptoms: Your throat may feel dry or sore from the anesthesia or the breathing tube placed in your throat during surgery. If this causes discomfort, gargle with warm salt water. The discomfort should disappear within 24 hours.  Tylenol can be taken after 4:09 pm    Regional Anesthesia Blocks  1. You may not be able to move or feel the "blocked" extremity after a regional anesthetic block. This may last may last from 3-48 hours after placement, but it will go away. The length of time depends on the medication injected and your individual response to the medication. As the nerves start to wake up, you may experience tingling as the movement and feeling returns to your extremity. If the numbness and inability to move your extremity has not gone away after 48 hours, please call your surgeon.   2. The extremity that is blocked will need to be protected until the numbness is gone and the strength has returned. Because you cannot feel it, you will need to take extra care to avoid injury. Because it may be weak, you may have difficulty moving it or using it. You may not know what  position it is in without looking at it while the block is in effect.  3. For blocks in the legs and feet, returning to weight bearing and walking needs to be done carefully. You will need to wait until the numbness is entirely gone and the strength has returned. You should be able to move your leg and foot normally before you try and bear weight or walk. You will need someone to be with you when you first try to ensure you do not fall and possibly risk injury.  4. Bruising and tenderness at the needle site are common side effects and will resolve in a few days.  5. Persistent numbness or new problems with movement should be communicated to the surgeon or the [facility] [id number] [facility] [phone]).

## 2023-08-23 NOTE — Anesthesia Postprocedure Evaluation (Signed)
Anesthesia Post Note  Patient: Brandi Roberts  Procedure(s) Performed: EXCISIONAL BIOPSY RIGHT AXILLARY LYMPH NODE (Right: Breast)     Patient location during evaluation: PACU Anesthesia Type: General Level of consciousness: awake Pain management: pain level controlled Vital Signs Assessment: post-procedure vital signs reviewed and stable Respiratory status: spontaneous breathing, nonlabored ventilation and respiratory function stable Cardiovascular status: blood pressure returned to baseline and stable Postop Assessment: no apparent nausea or vomiting Anesthetic complications: no   No notable events documented.  Last Vitals:  Vitals:   08/23/23 1329 08/23/23 1330  BP: (!) 156/58 (!) 156/58  Pulse: 63 62  Resp: (!) 8 (!) 6  Temp:    SpO2: 93% 97%    Last Pain:  Vitals:   08/23/23 1313  TempSrc:   PainSc: 5                  Linton Rump

## 2023-08-23 NOTE — Op Note (Signed)
EXCISIONAL BIOPSY RIGHT AXILLARY LYMPH NODE  Procedure Note  Brandi Roberts 08/23/2023   Pre-op Diagnosis: HISTORY OF RIGHT BREAST CANCER, RIGHT AXILLARY LYMPHADENOPATHY     Post-op Diagnosis: same  Procedure(s): EXCISIONAL BIOPSY DEEP RIGHT AXILLARY LYMPH NODE  Surgeon(s): Abigail Miyamoto, MD  Anesthesia: General  Staff:  Circulator: Kennith Maes, RN; Griffin Basil, RN Relief Circulator: Raliegh Scarlet, RN Scrub Person: Verdie Drown  Estimated Blood Loss: Minimal               Specimens: SENT TO PATH  Indications: This is a 67 year old female who had undergone a right mastectomy and sentinel node biopsy for recurrent breast cancer.  Prior to that, she had had a previous lumpectomy and sentinel node biopsy many years ago and radiation therapy.  During her most recent PET scan she was found to have a 2 cm lymph node in her right axilla with increased activity.  Medical oncology has recommended removal of this lymph node for histologic evaluation  Procedure: Patient was brought to the operating identified as correct patient.  She was placed upon the operating table and general anesthesia was induced.  Her right chest and axilla were then prepped and draped in usual sterile fashion.  I anesthetized skin of the previous scar from her prior biopsies with Marcaine and then made a longitudinal incision with a scalpel.  I then dissected down into the axillary tissue with the cautery.  From her previous surgery in the axilla she had extensive scarring.  I did excise superficial scar with a cautery in order to reach the deeper axilla.  Identified old surgical clips in the area.  The patient had small lymph nodes overlying the larger lymph node.  I removed the small lymph nodes and they were sent to pathology.  I could then identify the large 2 cm lymph node underneath the edge of the pectoralis muscle.  As aggressive lymph node it pulled apart.  I was able to remove all of  the lymph node pieces with the cautery and surgical clips.  I then achieved hemostasis in the area with surgical clips as well.  Again there was quite extensive scarring in the axilla from the previous surgeries.  The lymph node and scar tissue were sent to pathology for evaluation.  I evaluated the axilla further and found no other enlarged lymph nodes.  We irrigated the axilla with saline.  Hemostasis appeared to be achieved.  I then closed the subcutaneous tissue with interrupted 3-0 Vicryl sutures and closed the skin with a running 4-0 Monocryl.  Dermabond was then applied.  The patient tolerated the procedure well.  All the counts were correct at the end of the procedure.  She was then extubated in the operating room and taken in stable condition to the recovery room.          Abigail Miyamoto   Date: 08/23/2023  Time: 12:27 PM

## 2023-08-23 NOTE — Anesthesia Procedure Notes (Signed)
Anesthesia Regional Block: Pectoralis block   Pre-Anesthetic Checklist: , timeout performed,  Correct Patient, Correct Site, Correct Laterality,  Correct Procedure, Correct Position, site marked,  Risks and benefits discussed,  Surgical consent,  Pre-op evaluation,  At surgeon's request and post-op pain management  Laterality: Right  Prep: chloraprep       Needles:  Injection technique: Single-shot  Needle Type: Echogenic Stimulator Needle     Needle Length: 9cm  Needle Gauge: 21     Additional Needles:   Procedures:,,,, ultrasound used (permanent image in chart),,    Narrative:  Start time: 08/23/2023 10:36 AM End time: 08/23/2023 10:39 AM Injection made incrementally with aspirations every 5 mL.  Performed by: Personally  Anesthesiologist: Linton Rump, MD  Additional Notes: Discussed risks and benefits of nerve block including, but not limited to, prolonged and/or permanent nerve injury involving sensory and/or motor function. Monitors were applied and a time-out was performed. The nerve and associated structures were visualized under ultrasound guidance. After negative aspiration, local anesthetic was slowly injected around the nerve. There was no evidence of high pressure during the procedure. There were no paresthesias. VSS remained stable and the patient tolerated the procedure well.

## 2023-08-23 NOTE — Transfer of Care (Signed)
Immediate Anesthesia Transfer of Care Note  Patient: Brandi Roberts  Procedure(s) Performed: EXCISIONAL BIOPSY RIGHT AXILLARY LYMPH NODE (Right: Breast)  Patient Location: PACU  Anesthesia Type:General  Level of Consciousness: awake, alert , and oriented  Airway & Oxygen Therapy: Patient Spontanous Breathing and Patient connected to face mask oxygen  Post-op Assessment: Report given to RN and Post -op Vital signs reviewed and stable  Post vital signs: Reviewed and stable  Last Vitals:  Vitals Value Taken Time  BP 155/55 08/23/23 1232  Temp    Pulse 68 08/23/23 1233  Resp 15 08/23/23 1233  SpO2 98 % 08/23/23 1233  Vitals shown include unfiled device data.  Last Pain:  Vitals:   08/23/23 1006  TempSrc: Oral  PainSc: 2       Patients Stated Pain Goal: 5 (08/23/23 1006)  Complications: No notable events documented.

## 2023-08-23 NOTE — Anesthesia Preprocedure Evaluation (Addendum)
Anesthesia Evaluation  Patient identified by MRN, date of birth, ID band Patient awake    Reviewed: Allergy & Precautions, NPO status , Patient's Chart, lab work & pertinent test results  History of Anesthesia Complications Negative for: history of anesthetic complications  Airway Mallampati: III  TM Distance: >3 FB Neck ROM: Full    Dental  (+) Dental Advisory Given   Pulmonary neg shortness of breath, asthma (no recent flares, used inhaler this morning) , neg recent URI, former smoker   Pulmonary exam normal breath sounds clear to auscultation       Cardiovascular hypertension (amlodipine, benazepril, hydralazine, metoprolol, triamterene-HCTZ), Pt. on medications and Pt. on home beta blockers (-) angina + DVT  (-) Past MI, (-) Cardiac Stents and (-) CABG + dysrhythmias (PACs, bigeminy)  Rhythm:Regular Rate:Normal  TTE 07/06/2023: IMPRESSIONS    1. Left ventricular ejection fraction, by estimation, is 55 to 60%. The  left ventricle has normal function. The left ventricle has no regional  wall motion abnormalities. Left ventricular diastolic parameters are  indeterminate. The average left  ventricular global longitudinal strain is -16.4 %. The global longitudinal  strain is normal.   2. Right ventricular systolic function is normal. The right ventricular  size is normal. Tricuspid regurgitation signal is inadequate for assessing  PA pressure.   3. The mitral valve is degenerative. Trivial mitral valve regurgitation.  No evidence of mitral stenosis. Moderate mitral annular calcification.   4. The aortic valve was not well visualized. Aortic valve regurgitation  is not visualized. Aortic valve sclerosis/calcification is present,  without any evidence of aortic stenosis. Aortic valve area, by VTI  measures 2.16 cm. Aortic valve mean gradient  measures 4.0 mmHg. Aortic valve Vmax measures 1.40 m/s.   5. The inferior vena cava is  normal in size with greater than 50%  respiratory variability, suggesting right atrial pressure of 3 mmHg.   6. Compared to study dated 11/07/2022, the LV GLS has increased from  -22.2% to -16.4%. LVF remains the same at 55-60%.     Neuro/Psych neg Seizures  Neuromuscular disease (neuropathy)    GI/Hepatic Neg liver ROS,GERD  ,,  Endo/Other  diabetes, Type 2, Oral Hypoglycemic Agents    Renal/GU negative Renal ROS     Musculoskeletal   Abdominal   Peds  Hematology negative hematology ROS (+)   Anesthesia Other Findings Last Eliquis: 08/20/2023  Reproductive/Obstetrics H/o right breast cancer                             Anesthesia Physical Anesthesia Plan  ASA: 3  Anesthesia Plan: General   Post-op Pain Management: Tylenol PO (pre-op)* and Regional block*   Induction: Intravenous  PONV Risk Score and Plan: 3 and Ondansetron, Dexamethasone and Treatment may vary due to age or medical condition  Airway Management Planned: LMA  Additional Equipment:   Intra-op Plan:   Post-operative Plan: Extubation in OR  Informed Consent: I have reviewed the patients History and Physical, chart, labs and discussed the procedure including the risks, benefits and alternatives for the proposed anesthesia with the patient or authorized representative who has indicated his/her understanding and acceptance.       Plan Discussed with: Anesthesiologist and CRNA  Anesthesia Plan Comments: (Risks of general anesthesia discussed including, but not limited to, sore throat, hoarse voice, chipped/damaged teeth, injury to vocal cords, nausea and vomiting, allergic reactions, lung infection, heart attack, stroke, and death. All questions answered. )  Anesthesia Quick Evaluation

## 2023-08-24 ENCOUNTER — Encounter (HOSPITAL_BASED_OUTPATIENT_CLINIC_OR_DEPARTMENT_OTHER): Payer: Self-pay | Admitting: Surgery

## 2023-08-25 ENCOUNTER — Encounter: Payer: Self-pay | Admitting: *Deleted

## 2023-08-25 ENCOUNTER — Other Ambulatory Visit: Payer: Self-pay

## 2023-08-25 NOTE — Progress Notes (Signed)
Per Dr Myna Hidalgo, request for [facility] One testing sent on specimen [phone] DOS 08/23/2023.  Dr Myna Hidalgo would like to see the patient in about 3 weeks. Message sent to scheduling.   Oncology Nurse Navigator Documentation     08/25/2023    3:00 PM  Oncology Nurse Navigator Flowsheets  Navigator Follow Up Date: 09/22/2023  Navigator Follow Up Reason: Follow-up After Biopsy  Navigator Location [facility]  Navigator Encounter Type Molecular Studies  Patient Visit Type MedOnc  Treatment Phase Post-Tx Follow-up  Barriers/Navigation Needs Coordination of Care;Education  Interventions Coordination of Care  Acuity Level 1-No Barriers  Coordination of Care Pathology  Support Groups/Services Friends and Family  Time Spent with Patient 30

## 2023-08-30 ENCOUNTER — Encounter: Payer: Self-pay | Admitting: Hematology & Oncology

## 2023-08-31 ENCOUNTER — Other Ambulatory Visit: Payer: Self-pay

## 2023-08-31 LAB — SURGICAL PATHOLOGY

## 2023-09-02 ENCOUNTER — Other Ambulatory Visit: Payer: Self-pay

## 2023-09-05 ENCOUNTER — Encounter (HOSPITAL_COMMUNITY): Payer: Self-pay | Admitting: Hematology & Oncology

## 2023-09-05 ENCOUNTER — Telehealth: Payer: Self-pay

## 2023-09-05 ENCOUNTER — Other Ambulatory Visit: Payer: Self-pay

## 2023-09-05 ENCOUNTER — Other Ambulatory Visit: Payer: Self-pay | Admitting: Hematology & Oncology

## 2023-09-05 MED ORDER — ALPRAZOLAM 0.25 MG PO TABS: 0.2500 mg | ORAL_TABLET | Freq: Three times a day (TID) | ORAL | 0 refills | Status: DC | PRN

## 2023-09-05 NOTE — Telephone Encounter (Signed)
Patient called requesting information on her [facility] testing since surgery 2 weeks ago and appointment info. Called patient back, she was upset that she hasn't heard anything back since her surgery. She states she was told at her surgeons office she would hear something in 2-3 days after surgery and she hasn't heard anything except receiving something in the mail from [facility]. Apologized to patient and informed her that [facility] does take 3-4 weeks for the results to come back and that we checked on the status and is estimated to come in on 9/19. Informed patient she is scheduled to come in on 9/27, it appears she was scheduled last week for this appointment, pt states she was not informed of this appointment. Apologized again to patient and informed her I would inform our Production designer, theatre/television/film. Patient was appreciative and declines any other questions or concerns at this time. Confirmed appt on 9/27.

## 2023-09-07 ENCOUNTER — Inpatient Hospital Stay: Payer: Medicare Other | Attending: Hematology & Oncology

## 2023-09-07 ENCOUNTER — Encounter: Payer: Self-pay | Admitting: Hematology & Oncology

## 2023-09-07 ENCOUNTER — Inpatient Hospital Stay: Payer: Medicare Other | Admitting: Hematology & Oncology

## 2023-09-07 ENCOUNTER — Inpatient Hospital Stay: Payer: Medicare Other

## 2023-09-07 ENCOUNTER — Other Ambulatory Visit: Payer: Self-pay

## 2023-09-07 VITALS — BP 144/61 | HR 88 | Resp 16 | Ht 66.0 in | Wt 195.0 lb

## 2023-09-07 DIAGNOSIS — Z9011 Acquired absence of right breast and nipple: Secondary | ICD-10-CM | POA: Diagnosis not present

## 2023-09-07 DIAGNOSIS — Z171 Estrogen receptor negative status [ER-]: Secondary | ICD-10-CM | POA: Insufficient documentation

## 2023-09-07 DIAGNOSIS — C50911 Malignant neoplasm of unspecified site of right female breast: Secondary | ICD-10-CM | POA: Insufficient documentation

## 2023-09-07 DIAGNOSIS — C773 Secondary and unspecified malignant neoplasm of axilla and upper limb lymph nodes: Secondary | ICD-10-CM | POA: Insufficient documentation

## 2023-09-07 DIAGNOSIS — Z7984 Long term (current) use of oral hypoglycemic drugs: Secondary | ICD-10-CM | POA: Diagnosis not present

## 2023-09-07 DIAGNOSIS — Z5112 Encounter for antineoplastic immunotherapy: Secondary | ICD-10-CM | POA: Insufficient documentation

## 2023-09-07 LAB — CMP (CANCER CENTER ONLY)
ALT: 9 U/L (ref 0–44)
AST: 16 U/L (ref 15–41)
Albumin: 3.6 g/dL (ref 3.5–5.0)
Alkaline Phosphatase: 60 U/L (ref 38–126)
Anion gap: 7 (ref 5–15)
BUN: 24 mg/dL — ABNORMAL HIGH (ref 8–23)
CO2: 28 mmol/L (ref 22–32)
Calcium: 9 mg/dL (ref 8.9–10.3)
Chloride: 99 mmol/L (ref 98–111)
Creatinine: 1.3 mg/dL — ABNORMAL HIGH (ref 0.44–1.00)
GFR, Estimated: 45 mL/min — ABNORMAL LOW (ref >60)
Glucose, Bld: 87 mg/dL (ref 70–99)
Potassium: 3.6 mmol/L (ref 3.5–5.1)
Sodium: 134 mmol/L — ABNORMAL LOW (ref 135–145)
Total Bilirubin: 0.3 mg/dL (ref 0.3–1.2)
Total Protein: 6.9 g/dL (ref 6.5–8.1)

## 2023-09-07 LAB — CBC WITH DIFFERENTIAL (CANCER CENTER ONLY)
Abs Immature Granulocytes: 0.04 10*3/uL (ref 0.00–0.07)
Basophils Absolute: 0.1 10*3/uL (ref 0.0–0.1)
Basophils Relative: 1 %
Eosinophils Absolute: 0.1 10*3/uL (ref 0.0–0.5)
Eosinophils Relative: 1 %
HCT: 32.4 % — ABNORMAL LOW (ref 36.0–46.0)
Hemoglobin: 10.7 g/dL — ABNORMAL LOW (ref 12.0–15.0)
Immature Granulocytes: 0 %
Lymphocytes Relative: 19 %
Lymphs Abs: 2.1 10*3/uL (ref 0.7–4.0)
MCH: 30.6 pg (ref 26.0–34.0)
MCHC: 33 g/dL (ref 30.0–36.0)
MCV: 92.6 fL (ref 80.0–100.0)
Monocytes Absolute: 0.8 10*3/uL (ref 0.1–1.0)
Monocytes Relative: 8 %
Neutro Abs: 8.1 10*3/uL — ABNORMAL HIGH (ref 1.7–7.7)
Neutrophils Relative %: 71 %
Platelet Count: 243 10*3/uL (ref 150–400)
RBC: 3.5 MIL/uL — ABNORMAL LOW (ref 3.87–5.11)
RDW: 12.7 % (ref 11.5–15.5)
WBC Count: 11.3 10*3/uL — ABNORMAL HIGH (ref 4.0–10.5)
nRBC: 0 % (ref 0.0–0.2)

## 2023-09-07 NOTE — Patient Instructions (Signed)

## 2023-09-07 NOTE — Progress Notes (Signed)
[facility] Follow Up Visit  Brandi Roberts [id number] 01-18-1956 67 y.o. 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  We now have confirmation that she does have recurrent disease in the axilla.  She had the axillary node resected on 08/15/2023.  The pathology report [id number]) showed metastatic carcinoma consistent with her breast cancer.  She had a PET scan which did not show any disease anywhere else.  Clearly, I suspect that she is going need to have chemotherapy.  We tried her on just  anti-HER2 therapy which was not effective as her malignancy recurred not even 2 months after which she completed the Kadcyla.  Think that she would be a great candidate for chemotherapy with Taxotere/carboplatinum/Perjeta/Herceptin  (TCHP).  This to be a very aggressive regimen for her.  I think she could tolerate this.  Of note, she had her last echocardiogram done in July which showed a very good ejection fraction of 60-65%.  She already has a Port-A-Cath in.  She has had no problems with right arm swelling.  She had a little bit of discomfort under the right arm because of surgery.  She does have good range of motion of the shoulder..  She has had no cough or shortness of breath.  She has had no nausea or vomiting.  There has been no change in bowel or bladder habits.  She has had no rashes.  There is been no headache.     Overall, I would say Horm status is ECOG 1.     Medications:  Allergies as of 09/07/2023       Reactions   Levaquin [levofloxacin] Nausea And Vomiting, Other (See Comments)   dizziness         Medication List        Accurate as of September 07, 2023  4:11 PM. If you have any questions, ask your nurse or doctor.          albuterol 108 (90 Base) MCG/ACT inhaler Commonly known as: VENTOLIN HFA Inhale 2 puffs into the lungs every 6 (six) hours as needed.   ALPRAZolam 0.25 MG tablet Commonly known as: XANAX TAKE 1 TABLET BY MOUTH 3 TIMES DAILY AS NEEDED FOR ANXIETY.   amLODipine 10 MG tablet Commonly known as: NORVASC Take 10 mg by mouth daily.   atorvastatin 10 MG tablet Commonly known as: LIPITOR Take 5 mg by mouth at bedtime.   benazepril 40 MG tablet Commonly known as: LOTENSIN Take 40 mg by mouth daily.   doxazosin 2 MG tablet Commonly known as: CARDURA Take 2 mg by mouth 2 (two) times daily.   Eliquis 5 MG Tabs tablet Generic drug: apixaban Take 5 mg by mouth 2 (two) times daily.   gabapentin 600 MG tablet Commonly known as: NEURONTIN Take 600 mg by mouth at bedtime.   hydrALAZINE 50 MG tablet Commonly known as: APRESOLINE Take 50 mg by mouth 2 (two) times daily.   Iron 325 (65 Fe) MG Tabs Take 1 tablet by mouth daily.   metFORMIN 1000 MG tablet Commonly known as: GLUCOPHAGE Take 1,000 mg by mouth 2 (two) times daily.   metoprolol succinate  100 MG 24 hr tablet Commonly known as: TOPROL-XL Take 100 mg by mouth daily.   oxyCODONE 5 MG immediate release tablet Commonly known as: Oxy IR/ROXICODONE Take 1 tablet (5 mg total) by mouth every 6 (six) hours as needed for moderate pain, severe pain or breakthrough pain.   triamterene-hydrochlorothiazide 75-50 MG tablet Commonly known as: MAXZIDE Take 1 tablet by mouth daily.   Vitamin D (Ergocalciferol) 1.25 MG (50000 UNIT) Caps capsule Commonly known as: DRISDOL Take 50,000 Units by mouth once a week.        Allergies:  Allergies  Allergen Reactions   Levaquin [Levofloxacin] Nausea And Vomiting and Other (See Comments)    dizziness    Past Medical History, Surgical history, Social  history, and Family History were reviewed and updated.  Review of Systems: Review of Systems  Constitutional: Negative.   HENT: Negative.    Eyes: Negative.   Respiratory: Negative.    Cardiovascular: Negative.   Gastrointestinal: Negative.   Genitourinary: Negative.   Musculoskeletal: Negative.   Skin: Negative.   Neurological: Negative.   Endo/Heme/Allergies: Negative.   Psychiatric/Behavioral: Negative.       Physical Exam:  height is 5\' 6"  (1.676 m) and weight is 195 lb (88.5 kg). Her blood pressure is 144/61 (abnormal) and her pulse is 88. Her respiration is 16.   Wt Readings from Last 3 Encounters:  09/07/23 195 lb (88.5 kg)  08/23/23 197 lb 8.5 oz (89.6 kg)  08/03/23 189 lb 3.2 oz (85.8 kg)    Physical Exam Vitals reviewed.  Constitutional:      Comments: Her breast exam shows left breast with no masses, edema or erythema.  There is no left axillary adenopathy.  Right chest wall shows healed mastectomy scar.  There is little bit of hyperpigmentation.  She has had no masses.  There is no erythema.  There is no right axillary adenopathy.  HENT:     Head: Normocephalic and atraumatic.  Eyes:     Pupils: Pupils are equal, round, and reactive to light.  Cardiovascular:     Rate and Rhythm: Normal rate and regular rhythm.     Heart sounds: Normal heart sounds.  Pulmonary:     Effort: Pulmonary effort is normal.     Breath sounds: Normal breath sounds.  Abdominal:     General: Bowel sounds are normal.     Palpations: Abdomen is soft.  Musculoskeletal:        General: No tenderness or deformity. Normal range of motion.     Cervical back: Normal range of motion.  Lymphadenopathy:     Cervical: No cervical adenopathy.  Skin:    General: Skin is warm and dry.     Findings: No erythema or rash.  Neurological:     Mental Status: She is alert and oriented to person, place, and time.  Psychiatric:        Behavior: Behavior normal.        Thought Content: Thought  content normal.        Judgment: Judgment normal.     Lab Results  Component Value Date   WBC 11.3 (H) 09/07/2023   HGB 10.7 (L) 09/07/2023   HCT 32.4 (L) 09/07/2023   MCV 92.6 09/07/2023   PLT 243 09/07/2023   Lab Results  Component Value Date   FERRITIN 258 01/02/2023   IRON 105 01/02/2023   TIBC 354 01/02/2023   UIBC 249 01/02/2023   IRONPCTSAT 30 01/02/2023   Lab Results  Component  Value Date   RBC 3.50 (L) 09/07/2023   No results found for: "KPAFRELGTCHN", "LAMBDASER", "KAPLAMBRATIO" No results found for: "IGGSERUM", "IGA", "IGMSERUM" No results found for: "TOTALPROTELP", "ALBUMINELP", "A1GS", "A2GS", "BETS", "BETA2SER", "GAMS", "MSPIKE", "SPEI"   Chemistry      Component Value Date/Time   NA 134 (L) 09/07/2023 1459   K 3.6 09/07/2023 1459   CL 99 09/07/2023 1459   CO2 28 09/07/2023 1459   BUN 24 (H) 09/07/2023 1459   CREATININE 1.30 (H) 09/07/2023 1459      Component Value Date/Time   CALCIUM 9.0 09/07/2023 1459   ALKPHOS 60 09/07/2023 1459   AST 16 09/07/2023 1459   ALT 9 09/07/2023 1459   BILITOT 0.3 09/07/2023 1459       Impression and Plan: Brandi Roberts is a pleasant 67 yo African American female with invasive ductal carcinoma of the right breast, ER-/PR-/HER2 + treated with mastectomy.   She had 1 positive lymph node.  Unfortunately, she recurred quickly after anti-HER2 therapy.  Again, I think that utilizing the protocol  TCHP would be a very good way to go.  Given that she does not have any measurable disease at this point, I probably would consider for "adjuvant" therapy.  I did give her 6 cycles.  I would then see about a year of Nerlynx which would be oral.  She comes in with her family.  I had a long talk with them.  I gave them information sheets about treatment.  I told them that my biggest concern was the risk of infection.  The Taxotere and the carboplatinum could certainly cause her immune system to decrease.  She clearly will need G-CSF  afterwards.  She will lose her hair.  She is aware of this.  Again, I believe that this is a very aggressive protocol.  I believe that it will hopefully keep her in remission and hopefully cure this problem.  We will try to get started next week.  I would like to see her back when she starts her second cycle of treatment.    Josph Macho, MD 9/12/20244:11 PM

## 2023-09-07 NOTE — Progress Notes (Signed)
DISCONTINUE OFF PATHWAY REGIMEN - Breast   [id number]:Ado-Trastuzumab Emtansine 3.6 mg/kg IV D1 q21 Days:   A cycle is every 21 days:     Ado-trastuzumab emtansine   **Always confirm dose/schedule in your pharmacy ordering system**  REASON: Other Reason PRIOR TREATMENT: Off Pathway: Ado-Trastuzumab Emtansine 3.6 mg/kg IV D1 q21 Days TREATMENT RESPONSE: Unable to Evaluate  START ON PATHWAY REGIMEN - Breast     Cycle 1: A cycle is 21 days:     Pertuzumab      Trastuzumab-xxxx      Docetaxel      Carboplatin    Cycles 2 through 6: A cycle is every 21 days:     Pertuzumab      Trastuzumab-xxxx      Docetaxel      Carboplatin    Cycles 7 through 17: A cycle is every 21 days:     Pertuzumab      Trastuzumab-xxxx   **Always confirm dose/schedule in your pharmacy ordering system**  Patient Characteristics: Locoregional  Recurrent Disease - Resected, M0, HER2 Positive, ER Negative, Greater than 6 Months Since Prior Taxane, Node Positive Therapeutic Status: Locoregional Recurrent Disease - Resected, M0 ER Status: Negative (-) HER2 Status: Positive (+) PR Status: Negative (-) Intent of Therapy: Curative Intent, Discussed with Patient

## 2023-09-08 ENCOUNTER — Encounter: Payer: Self-pay | Admitting: Hematology & Oncology

## 2023-09-08 ENCOUNTER — Encounter: Payer: Self-pay | Admitting: *Deleted

## 2023-09-08 NOTE — Progress Notes (Unsigned)
Patient was seen in the office after her biopsy to discuss treatment. She will start TCHP next week (pending insurance). She already has a port in place.   Oncology Nurse Navigator Documentation     09/08/2023    8:30 AM  Oncology Nurse Navigator Flowsheets  Navigator Follow Up Date: 09/13/2023  Navigator Follow Up Reason: Chemotherapy  Navigator Location [facility]  Navigator Encounter Type Appt/Treatment Plan Review  Patient Visit Type MedOnc  Treatment Phase Pre-Tx/Tx Discussion  Barriers/Navigation Needs Coordination of Care;Education  Interventions Coordination of Care  Acuity Level 2-Minimal Needs (1-2 Barriers Identified)  Coordination of Care Other  Support Groups/Services Friends and Family  Time Spent with Patient 15

## 2023-09-10 ENCOUNTER — Other Ambulatory Visit: Payer: Self-pay

## 2023-09-11 NOTE — Progress Notes (Signed)
Pharmacist Chemotherapy Monitoring - Initial Assessment    Anticipated start date: 09/13/2023   The following has been reviewed per standard work regarding the patient's treatment regimen: The patient's diagnosis, treatment plan and drug doses, and organ/hematologic function Lab orders and baseline tests specific to treatment regimen  The treatment plan start date, drug sequencing, and pre-medications Prior authorization status  Patient's documented medication list, including drug-drug interaction screen and prescriptions for anti-emetics and supportive care specific to the treatment regimen The drug concentrations, fluid compatibility, administration routes, and timing of the medications to be used The patient's access for treatment and lifetime cumulative dose history, if applicable  The patient's medication allergies and previous infusion related reactions, if applicable   Changes made to treatment plan:  N/A  Follow up needed:  N/A   Candelaria Stagers, [facility], 09/11/2023  11:08 AM

## 2023-09-12 ENCOUNTER — Encounter: Payer: Self-pay | Admitting: Hematology & Oncology

## 2023-09-13 ENCOUNTER — Inpatient Hospital Stay: Payer: Medicare Other

## 2023-09-13 ENCOUNTER — Encounter: Payer: Self-pay | Admitting: *Deleted

## 2023-09-13 ENCOUNTER — Other Ambulatory Visit: Payer: Self-pay

## 2023-09-13 ENCOUNTER — Ambulatory Visit: Payer: Medicare Other | Admitting: Hematology & Oncology

## 2023-09-13 VITALS — BP 163/62 | HR 17 | Temp 98.8°F | Resp 17

## 2023-09-13 DIAGNOSIS — C50911 Malignant neoplasm of unspecified site of right female breast: Secondary | ICD-10-CM | POA: Diagnosis not present

## 2023-09-13 LAB — CBC WITH DIFFERENTIAL (CANCER CENTER ONLY)
Abs Immature Granulocytes: 0.04 10*3/uL (ref 0.00–0.07)
Basophils Absolute: 0.1 10*3/uL (ref 0.0–0.1)
Basophils Relative: 1 %
Eosinophils Absolute: 0.1 10*3/uL (ref 0.0–0.5)
Eosinophils Relative: 1 %
HCT: 32.1 % — ABNORMAL LOW (ref 36.0–46.0)
Hemoglobin: 10.6 g/dL — ABNORMAL LOW (ref 12.0–15.0)
Immature Granulocytes: 0 %
Lymphocytes Relative: 21 %
Lymphs Abs: 2 10*3/uL (ref 0.7–4.0)
MCH: 30.7 pg (ref 26.0–34.0)
MCHC: 33 g/dL (ref 30.0–36.0)
MCV: 93 fL (ref 80.0–100.0)
Monocytes Absolute: 0.8 10*3/uL (ref 0.1–1.0)
Monocytes Relative: 8 %
Neutro Abs: 6.9 10*3/uL (ref 1.7–7.7)
Neutrophils Relative %: 69 %
Platelet Count: 226 10*3/uL (ref 150–400)
RBC: 3.45 MIL/uL — ABNORMAL LOW (ref 3.87–5.11)
RDW: 12.7 % (ref 11.5–15.5)
WBC Count: 9.9 10*3/uL (ref 4.0–10.5)
nRBC: 0 % (ref 0.0–0.2)

## 2023-09-13 LAB — CMP (CANCER CENTER ONLY)
ALT: 10 U/L (ref 0–44)
AST: 15 U/L (ref 15–41)
Albumin: 3.6 g/dL (ref 3.5–5.0)
Alkaline Phosphatase: 62 U/L (ref 38–126)
Anion gap: 7 (ref 5–15)
BUN: 17 mg/dL (ref 8–23)
CO2: 27 mmol/L (ref 22–32)
Calcium: 9.4 mg/dL (ref 8.9–10.3)
Chloride: 102 mmol/L (ref 98–111)
Creatinine: 1.11 mg/dL — ABNORMAL HIGH (ref 0.44–1.00)
GFR, Estimated: 54 mL/min — ABNORMAL LOW (ref >60)
Glucose, Bld: 106 mg/dL — ABNORMAL HIGH (ref 70–99)
Potassium: 4.1 mmol/L (ref 3.5–5.1)
Sodium: 136 mmol/L (ref 135–145)
Total Bilirubin: 0.3 mg/dL (ref 0.3–1.2)
Total Protein: 6.9 g/dL (ref 6.5–8.1)

## 2023-09-13 MED ORDER — SODIUM CHLORIDE 0.9 % IV SOLN
840.0000 mg | Freq: Once | INTRAVENOUS | Status: AC
  Administered 2023-09-13: 840 mg via INTRAVENOUS
  Filled 2023-09-13: qty 28

## 2023-09-13 MED ORDER — SODIUM CHLORIDE 0.9 % IV SOLN: Freq: Once | INTRAVENOUS | Status: AC

## 2023-09-13 MED ORDER — PROCHLORPERAZINE MALEATE 10 MG PO TABS
10.0000 mg | ORAL_TABLET | Freq: Four times a day (QID) | ORAL | 1 refills | Status: DC | PRN
Start: 2023-09-13 — End: 2024-01-04

## 2023-09-13 MED ORDER — PALONOSETRON HCL INJECTION 0.25 MG/5ML
0.2500 mg | Freq: Once | INTRAVENOUS | Status: AC
  Administered 2023-09-13: 0.25 mg via INTRAVENOUS
  Filled 2023-09-13: qty 5

## 2023-09-13 MED ORDER — SODIUM CHLORIDE 0.9 % IV SOLN
63.7500 mg/m2 | Freq: Once | INTRAVENOUS | Status: AC
  Administered 2023-09-13: 129 mg via INTRAVENOUS
  Filled 2023-09-13: qty 12.9

## 2023-09-13 MED ORDER — SODIUM CHLORIDE 0.9 % IV SOLN
10.0000 mg | Freq: Once | INTRAVENOUS | Status: AC
  Administered 2023-09-13: 10 mg via INTRAVENOUS
  Filled 2023-09-13: qty 10

## 2023-09-13 MED ORDER — SODIUM CHLORIDE 0.9% FLUSH
10.0000 mL | INTRAVENOUS | Status: DC | PRN
  Administered 2023-09-13: 10 mL

## 2023-09-13 MED ORDER — TRASTUZUMAB-ANNS CHEMO 150 MG IV SOLR
8.0000 mg/kg | Freq: Once | INTRAVENOUS | Status: AC
  Administered 2023-09-13: 714 mg via INTRAVENOUS
  Filled 2023-09-13: qty 34

## 2023-09-13 MED ORDER — DEXAMETHASONE 4 MG PO TABS
ORAL_TABLET | ORAL | 1 refills | Status: DC
Start: 2023-09-13 — End: 2023-09-13

## 2023-09-13 MED ORDER — ONDANSETRON HCL 8 MG PO TABS: 8.0000 mg | ORAL_TABLET | Freq: Three times a day (TID) | ORAL | 1 refills | Status: DC | PRN

## 2023-09-13 MED ORDER — LIDOCAINE-PRILOCAINE 2.5-2.5 % EX CREA: TOPICAL_CREAM | CUTANEOUS | 3 refills | Status: DC

## 2023-09-13 MED ORDER — SODIUM CHLORIDE 0.9 % IV SOLN
150.0000 mg | Freq: Once | INTRAVENOUS | Status: AC
  Administered 2023-09-13: 150 mg via INTRAVENOUS
  Filled 2023-09-13: qty 150

## 2023-09-13 MED ORDER — SODIUM CHLORIDE 0.9 % IV SOLN
450.0000 mg | Freq: Once | INTRAVENOUS | Status: AC
  Administered 2023-09-13: 450 mg via INTRAVENOUS
  Filled 2023-09-13: qty 45

## 2023-09-13 MED ORDER — ACETAMINOPHEN 325 MG PO TABS
650.0000 mg | ORAL_TABLET | Freq: Once | ORAL | Status: AC
  Administered 2023-09-13: 650 mg via ORAL
  Filled 2023-09-13: qty 2

## 2023-09-13 MED ORDER — DEXAMETHASONE 4 MG PO TABS: ORAL_TABLET | ORAL | 1 refills | Status: DC

## 2023-09-13 MED ORDER — DIPHENHYDRAMINE HCL 25 MG PO CAPS
50.0000 mg | ORAL_CAPSULE | Freq: Once | ORAL | Status: AC
  Administered 2023-09-13: 50 mg via ORAL
  Filled 2023-09-13: qty 2

## 2023-09-13 MED ORDER — HEPARIN SOD (PORK) LOCK FLUSH 100 UNIT/ML IV SOLN
500.0000 [IU] | Freq: Once | INTRAVENOUS | Status: AC | PRN
  Administered 2023-09-13: 500 [IU]

## 2023-09-13 MED ORDER — PROCHLORPERAZINE MALEATE 10 MG PO TABS: 10.0000 mg | ORAL_TABLET | Freq: Four times a day (QID) | ORAL | 1 refills | Status: DC | PRN

## 2023-09-13 NOTE — Progress Notes (Signed)
Carboplatin AUC 5 per MD.  Richardean Sale, RPH, BCPS, BCOP 09/13/2023 9:07 AM

## 2023-09-13 NOTE — Progress Notes (Unsigned)
Patient is starting her new treatment today for recurrent disease.   Oncology Nurse Navigator Documentation     09/13/2023    9:00 AM  Oncology Nurse Navigator Flowsheets  Navigator Follow Up Date: 10/04/2023  Navigator Follow Up Reason: Follow-up Appointment;Chemotherapy  Navigator Location [facility]  Navigator Encounter Type Appt/Treatment Plan Review  Patient Visit Type MedOnc  Treatment Phase First Chemo Tx  Barriers/Navigation Needs Coordination of Care;Education  Interventions None Required  Acuity Level 2-Minimal Needs (1-2 Barriers Identified)  Support Groups/Services Friends and Family  Time Spent with Patient 15

## 2023-09-13 NOTE — Progress Notes (Signed)
Per Dr. Myna Hidalgo ok to use labs from 09/07/2023 to start treatment.

## 2023-09-13 NOTE — Patient Instructions (Signed)
[facility]  Discharge Instructions: Thank you for choosing [facility] to provide your oncology and hematology care.   If you have a lab appointment with the Cancer Center, please go directly to the Cancer Center and check in at the registration area.  Wear comfortable clothing and clothing appropriate for easy access to any Portacath or PICC line.   We strive to give you quality time with your provider. You may need to reschedule your appointment if you arrive late (15 or more minutes).  Arriving late affects you and other patients whose appointments are after yours.  Also, if you miss three or more appointments without notifying the office, you may be dismissed from the clinic at the provider's discretion.      For prescription refill requests, have your pharmacy contact our office and allow 72 hours for refills to be completed.    Today you received the following chemotherapy and/or immunotherapy agents Kanjinti, Perjeta, Taxotere and Carboplatin     ** DO NOT TAKE ZOFRAN FOR 3 DAYS AFTER CHEMOTHERAPY**  To help prevent nausea and vomiting after your treatment, we encourage you to take your nausea medication as directed.  BELOW ARE SYMPTOMS THAT SHOULD BE REPORTED IMMEDIATELY: *FEVER GREATER THAN 100.4 F (38 C) OR HIGHER *CHILLS OR SWEATING *NAUSEA AND VOMITING THAT IS NOT CONTROLLED WITH YOUR NAUSEA MEDICATION *UNUSUAL SHORTNESS OF BREATH *UNUSUAL BRUISING OR BLEEDING *URINARY PROBLEMS (pain or burning when urinating, or frequent urination) *BOWEL PROBLEMS (unusual diarrhea, constipation, pain near the anus) TENDERNESS IN MOUTH AND THROAT WITH OR WITHOUT PRESENCE OF ULCERS (sore throat, sores in mouth, or a toothache) UNUSUAL RASH, SWELLING OR PAIN  UNUSUAL VAGINAL DISCHARGE OR ITCHING   Items with * indicate a potential emergency and should be followed up as soon as possible or go to the Emergency Department if any problems should  occur.  Please show the CHEMOTHERAPY ALERT CARD or IMMUNOTHERAPY ALERT CARD at check-in to the Emergency Department and triage nurse. Should you have questions after your visit or need to cancel or reschedule your appointment, please contact [facility]  [phone] and follow the prompts.  Office hours are 8:00 a.m. to 4:30 p.m. Monday - Friday. Please note that voicemails left after 4:00 p.m. may not be returned until the following business day.  We are closed weekends and major holidays. You have access to a nurse at all times for urgent questions. Please call the main number to the clinic [phone] and follow the prompts.  For any non-urgent questions, you may also contact your provider using MyChart. We now offer e-Visits for anyone 77 and older to request care online for non-urgent symptoms. For details visit [email or web address].   Also download the MyChart app! Go to the app store, search "MyChart", open the app, select [place], and log in with your MyChart username and password.

## 2023-09-14 ENCOUNTER — Encounter: Payer: Self-pay | Admitting: Hematology & Oncology

## 2023-09-15 ENCOUNTER — Inpatient Hospital Stay: Payer: Medicare Other

## 2023-09-15 VITALS — BP 157/72 | HR 64 | Temp 98.5°F | Resp 20

## 2023-09-15 DIAGNOSIS — C50911 Malignant neoplasm of unspecified site of right female breast: Secondary | ICD-10-CM

## 2023-09-15 MED ORDER — PEGFILGRASTIM-CBQV 6 MG/0.6ML ~~LOC~~ SOSY
6.0000 mg | PREFILLED_SYRINGE | Freq: Once | SUBCUTANEOUS | Status: AC
  Administered 2023-09-15: 6 mg via SUBCUTANEOUS
  Filled 2023-09-15: qty 0.6

## 2023-09-15 NOTE — Patient Instructions (Signed)

## 2023-09-19 ENCOUNTER — Telehealth: Payer: Self-pay

## 2023-09-19 NOTE — Telephone Encounter (Signed)
Patient called stating she had chemotherapy last Wednesday and now has diarrhea, states she is staying hydrated and just wants to know if she can take imodium as she has it at home. Called patient back and informed her to try the imodium and if it doesn't resolve to call us back and let us know.

## 2023-09-21 ENCOUNTER — Other Ambulatory Visit: Payer: Self-pay | Admitting: *Deleted

## 2023-09-21 ENCOUNTER — Encounter: Payer: Self-pay | Admitting: Hematology & Oncology

## 2023-09-21 ENCOUNTER — Other Ambulatory Visit (HOSPITAL_BASED_OUTPATIENT_CLINIC_OR_DEPARTMENT_OTHER): Payer: Self-pay

## 2023-09-21 ENCOUNTER — Inpatient Hospital Stay: Payer: Medicare Other

## 2023-09-21 ENCOUNTER — Telehealth: Payer: Self-pay | Admitting: *Deleted

## 2023-09-21 VITALS — BP 141/62 | HR 66 | Temp 97.1°F | Resp 18

## 2023-09-21 DIAGNOSIS — A09 Infectious gastroenteritis and colitis, unspecified: Secondary | ICD-10-CM

## 2023-09-21 DIAGNOSIS — Z9011 Acquired absence of right breast and nipple: Secondary | ICD-10-CM

## 2023-09-21 DIAGNOSIS — C50911 Malignant neoplasm of unspecified site of right female breast: Secondary | ICD-10-CM

## 2023-09-21 DIAGNOSIS — E871 Hypo-osmolality and hyponatremia: Secondary | ICD-10-CM

## 2023-09-21 DIAGNOSIS — K1379 Other lesions of oral mucosa: Secondary | ICD-10-CM

## 2023-09-21 DIAGNOSIS — Z95828 Presence of other vascular implants and grafts: Secondary | ICD-10-CM

## 2023-09-21 DIAGNOSIS — R197 Diarrhea, unspecified: Secondary | ICD-10-CM

## 2023-09-21 LAB — CBC WITH DIFFERENTIAL (CANCER CENTER ONLY)
Abs Immature Granulocytes: 0.19 10*3/uL — ABNORMAL HIGH (ref 0.00–0.07)
Basophils Absolute: 0.1 10*3/uL (ref 0.0–0.1)
Basophils Relative: 1 %
Eosinophils Absolute: 0 10*3/uL (ref 0.0–0.5)
Eosinophils Relative: 0 %
HCT: 29.9 % — ABNORMAL LOW (ref 36.0–46.0)
Hemoglobin: 9.9 g/dL — ABNORMAL LOW (ref 12.0–15.0)
Immature Granulocytes: 1 %
Lymphocytes Relative: 20 %
Lymphs Abs: 2.9 10*3/uL (ref 0.7–4.0)
MCH: 30.3 pg (ref 26.0–34.0)
MCHC: 33.1 g/dL (ref 30.0–36.0)
MCV: 91.4 fL (ref 80.0–100.0)
Monocytes Absolute: 1.8 10*3/uL — ABNORMAL HIGH (ref 0.1–1.0)
Monocytes Relative: 12 %
Neutro Abs: 9.8 10*3/uL — ABNORMAL HIGH (ref 1.7–7.7)
Neutrophils Relative %: 66 %
Platelet Count: 187 10*3/uL (ref 150–400)
RBC: 3.27 MIL/uL — ABNORMAL LOW (ref 3.87–5.11)
RDW: 12.6 % (ref 11.5–15.5)
Smear Review: NORMAL
WBC Count: 14.8 10*3/uL — ABNORMAL HIGH (ref 4.0–10.5)
nRBC: 0 % (ref 0.0–0.2)

## 2023-09-21 LAB — CMP (CANCER CENTER ONLY)
ALT: 16 U/L (ref 0–44)
AST: 17 U/L (ref 15–41)
Albumin: 3.3 g/dL — ABNORMAL LOW (ref 3.5–5.0)
Alkaline Phosphatase: 84 U/L (ref 38–126)
Anion gap: 6 (ref 5–15)
BUN: 27 mg/dL — ABNORMAL HIGH (ref 8–23)
CO2: 20 mmol/L — ABNORMAL LOW (ref 22–32)
Calcium: 8.7 mg/dL — ABNORMAL LOW (ref 8.9–10.3)
Chloride: 101 mmol/L (ref 98–111)
Creatinine: 1.95 mg/dL — ABNORMAL HIGH (ref 0.44–1.00)
GFR, Estimated: 27 mL/min — ABNORMAL LOW (ref >60)
Glucose, Bld: 84 mg/dL (ref 70–99)
Potassium: 3.9 mmol/L (ref 3.5–5.1)
Sodium: 127 mmol/L — ABNORMAL LOW (ref 135–145)
Total Bilirubin: 0.2 mg/dL — ABNORMAL LOW (ref 0.3–1.2)
Total Protein: 6 g/dL — ABNORMAL LOW (ref 6.5–8.1)

## 2023-09-21 LAB — C DIFFICILE QUICK SCREEN W PCR REFLEX
C Diff antigen: NEGATIVE
C Diff interpretation: NOT DETECTED
C Diff toxin: NEGATIVE

## 2023-09-21 MED ORDER — NYSTATIN 100000 UNIT/ML MT SUSP
5.0000 mL | Freq: Three times a day (TID) | OROMUCOSAL | 1 refills | Status: DC
  Filled 2023-09-21: qty 240, 12d supply, fill #0

## 2023-09-21 MED ORDER — SODIUM CHLORIDE 0.9% FLUSH
10.0000 mL | Freq: Once | INTRAVENOUS | Status: AC
  Administered 2023-09-21: 10 mL via INTRAVENOUS

## 2023-09-21 MED ORDER — MAGIC MOUTHWASH W/LIDOCAINE
ORAL | 1 refills | Status: DC
Start: 2023-09-21 — End: 2023-09-21

## 2023-09-21 MED ORDER — HEPARIN SOD (PORK) LOCK FLUSH 100 UNIT/ML IV SOLN
500.0000 [IU] | Freq: Once | INTRAVENOUS | Status: AC
  Administered 2023-09-21: 500 [IU] via INTRAVENOUS

## 2023-09-21 MED ORDER — MAGIC MOUTHWASH W/LIDOCAINE
ORAL | 1 refills | Status: DC
Start: 2023-09-21 — End: 2024-09-30

## 2023-09-21 MED ORDER — METRONIDAZOLE 500 MG PO TABS: 500.0000 mg | ORAL_TABLET | Freq: Three times a day (TID) | ORAL | 0 refills | Status: DC

## 2023-09-21 MED ORDER — SODIUM CHLORIDE 0.9 % IV SOLN: INTRAVENOUS | Status: DC

## 2023-09-21 NOTE — Patient Instructions (Signed)

## 2023-09-21 NOTE — Progress Notes (Signed)
Patient complains of 2 watery stools in the past 24 hours. Patient states she is still able to eat and drink. However, the back of her throat is tender. No redness, ulceration or sores noted to throat upon assessment. Patient states he has noticed an increase in fatigue since this past Saturday. Dr. Myna Hidalgo notified.

## 2023-09-21 NOTE — Telephone Encounter (Signed)
Call received from patient stating that despite taking Imodium, diarrhea has continued and she would like to know what to take next.  Pt states that her PO intake is decreased and that she has had periods of dizziness and lightheadedness.  Dr. Myna Hidalgo notified and order received for pt to come in for labs and IVF's.  Pt notified of MD order and states that she can be here at 1:00PM.  Message sent to scheduling.

## 2023-09-21 NOTE — Patient Instructions (Signed)

## 2023-09-22 ENCOUNTER — Ambulatory Visit: Payer: Medicare Other | Admitting: Hematology & Oncology

## 2023-09-22 ENCOUNTER — Other Ambulatory Visit: Payer: Medicare Other

## 2023-09-22 ENCOUNTER — Telehealth: Payer: Self-pay | Admitting: *Deleted

## 2023-09-22 NOTE — Telephone Encounter (Signed)
Call received from patient stating that she is feeling much better today and does not feel as though she needs to come in for IVF's today, but that she would like to come in on Monday for labs and IVF's.  Message sent to scheduling.  Pt notified per order of Dr. Myna Hidalgo that the c diff is negative, but to continue the Flagyl as scheduled yesterday.  Pt states that she has had two loose stools since leaving this office yesterday.  Dr. Myna Hidalgo notified.

## 2023-09-22 NOTE — Telephone Encounter (Signed)
-----   Message from Josph Macho sent at 09/22/2023  8:51 AM EDT ----- Please call and let her know that the C. difficile is negative.  Please find out how her diarrhea is doing.  Thanks.  Cindee Lame

## 2023-09-25 ENCOUNTER — Inpatient Hospital Stay: Payer: Medicare Other

## 2023-09-26 ENCOUNTER — Ambulatory Visit: Payer: Medicare Other | Admitting: Medical Oncology

## 2023-09-26 ENCOUNTER — Telehealth: Payer: Self-pay

## 2023-09-26 ENCOUNTER — Other Ambulatory Visit: Payer: Medicare Other

## 2023-09-26 ENCOUNTER — Ambulatory Visit: Payer: Medicare Other

## 2023-09-26 NOTE — Telephone Encounter (Signed)
Pt called in wanting to know what Dr Myna Hidalgo thinks about her using liquid IV and how many times a day is safe to use this. I inquired about her current symptoms and she stated that she was only tired. Pt has a f/u apt with Dr E and labs next Wednesday, 10/04/23, and plans to discuss further then. Dr Myna Hidalgo said it was ok for her to use the liquid IVs and ok to use up to 3xs a day. Pt states understanding.

## 2023-10-04 ENCOUNTER — Encounter: Payer: Self-pay | Admitting: *Deleted

## 2023-10-04 ENCOUNTER — Inpatient Hospital Stay (HOSPITAL_BASED_OUTPATIENT_CLINIC_OR_DEPARTMENT_OTHER): Payer: Medicare Other | Admitting: Hematology & Oncology

## 2023-10-04 ENCOUNTER — Inpatient Hospital Stay: Payer: Medicare Other | Attending: Hematology & Oncology

## 2023-10-04 ENCOUNTER — Encounter: Payer: Self-pay | Admitting: Hematology & Oncology

## 2023-10-04 ENCOUNTER — Other Ambulatory Visit: Payer: Self-pay

## 2023-10-04 ENCOUNTER — Inpatient Hospital Stay: Payer: Medicare Other

## 2023-10-04 VITALS — BP 163/54 | HR 68 | Temp 98.3°F | Resp 18 | Ht 66.0 in | Wt 193.0 lb

## 2023-10-04 VITALS — BP 157/56 | HR 66 | Resp 18

## 2023-10-04 DIAGNOSIS — C773 Secondary and unspecified malignant neoplasm of axilla and upper limb lymph nodes: Secondary | ICD-10-CM | POA: Diagnosis not present

## 2023-10-04 DIAGNOSIS — C50911 Malignant neoplasm of unspecified site of right female breast: Secondary | ICD-10-CM

## 2023-10-04 DIAGNOSIS — Z171 Estrogen receptor negative status [ER-]: Secondary | ICD-10-CM

## 2023-10-04 DIAGNOSIS — Z5111 Encounter for antineoplastic chemotherapy: Secondary | ICD-10-CM | POA: Insufficient documentation

## 2023-10-04 DIAGNOSIS — Z9011 Acquired absence of right breast and nipple: Secondary | ICD-10-CM | POA: Insufficient documentation

## 2023-10-04 DIAGNOSIS — Z5189 Encounter for other specified aftercare: Secondary | ICD-10-CM | POA: Diagnosis not present

## 2023-10-04 DIAGNOSIS — Z5112 Encounter for antineoplastic immunotherapy: Secondary | ICD-10-CM | POA: Diagnosis present

## 2023-10-04 LAB — CMP (CANCER CENTER ONLY)
ALT: 12 U/L (ref 0–44)
AST: 19 U/L (ref 15–41)
Albumin: 3.5 g/dL (ref 3.5–5.0)
Alkaline Phosphatase: 66 U/L (ref 38–126)
Anion gap: 7 (ref 5–15)
BUN: 23 mg/dL (ref 8–23)
CO2: 27 mmol/L (ref 22–32)
Calcium: 8.8 mg/dL — ABNORMAL LOW (ref 8.9–10.3)
Chloride: 103 mmol/L (ref 98–111)
Creatinine: 1.31 mg/dL — ABNORMAL HIGH (ref 0.44–1.00)
GFR, Estimated: 44 mL/min — ABNORMAL LOW (ref >60)
Glucose, Bld: 107 mg/dL — ABNORMAL HIGH (ref 70–99)
Potassium: 3.7 mmol/L (ref 3.5–5.1)
Sodium: 137 mmol/L (ref 135–145)
Total Bilirubin: 0.3 mg/dL (ref 0.3–1.2)
Total Protein: 6.7 g/dL (ref 6.5–8.1)

## 2023-10-04 LAB — IRON AND IRON BINDING CAPACITY (CC-WL,HP ONLY)
Iron: 67 ug/dL (ref 28–170)
Saturation Ratios: 21 % (ref 10.4–31.8)
TIBC: 328 ug/dL (ref 250–450)
UIBC: 261 ug/dL (ref 148–442)

## 2023-10-04 LAB — CBC WITH DIFFERENTIAL (CANCER CENTER ONLY)
Abs Immature Granulocytes: 0.03 10*3/uL (ref 0.00–0.07)
Basophils Absolute: 0 10*3/uL (ref 0.0–0.1)
Basophils Relative: 0 %
Eosinophils Absolute: 0 10*3/uL (ref 0.0–0.5)
Eosinophils Relative: 0 %
HCT: 27.8 % — ABNORMAL LOW (ref 36.0–46.0)
Hemoglobin: 9.3 g/dL — ABNORMAL LOW (ref 12.0–15.0)
Immature Granulocytes: 0 %
Lymphocytes Relative: 19 %
Lymphs Abs: 1.8 10*3/uL (ref 0.7–4.0)
MCH: 30.5 pg (ref 26.0–34.0)
MCHC: 33.5 g/dL (ref 30.0–36.0)
MCV: 91.1 fL (ref 80.0–100.0)
Monocytes Absolute: 0.9 10*3/uL (ref 0.1–1.0)
Monocytes Relative: 9 %
Neutro Abs: 6.9 10*3/uL (ref 1.7–7.7)
Neutrophils Relative %: 72 %
Platelet Count: 175 10*3/uL (ref 150–400)
RBC: 3.05 MIL/uL — ABNORMAL LOW (ref 3.87–5.11)
RDW: 13.2 % (ref 11.5–15.5)
WBC Count: 9.7 10*3/uL (ref 4.0–10.5)
nRBC: 0 % (ref 0.0–0.2)

## 2023-10-04 LAB — FERRITIN: Ferritin: 188 ng/mL (ref 11–307)

## 2023-10-04 LAB — LACTATE DEHYDROGENASE: LDH: 206 U/L — ABNORMAL HIGH (ref 98–192)

## 2023-10-04 MED ORDER — TRASTUZUMAB-ANNS CHEMO 150 MG IV SOLR
6.0000 mg/kg | Freq: Once | INTRAVENOUS | Status: AC
  Administered 2023-10-04: 525 mg via INTRAVENOUS
  Filled 2023-10-04: qty 25

## 2023-10-04 MED ORDER — PALONOSETRON HCL INJECTION 0.25 MG/5ML
0.2500 mg | Freq: Once | INTRAVENOUS | Status: AC
  Administered 2023-10-04: 0.25 mg via INTRAVENOUS
  Filled 2023-10-04: qty 5

## 2023-10-04 MED ORDER — HEPARIN SOD (PORK) LOCK FLUSH 100 UNIT/ML IV SOLN
500.0000 [IU] | Freq: Once | INTRAVENOUS | Status: AC | PRN
  Administered 2023-10-04: 500 [IU]

## 2023-10-04 MED ORDER — SODIUM CHLORIDE 0.9 % IV SOLN
10.0000 mg | Freq: Once | INTRAVENOUS | Status: AC
  Administered 2023-10-04: 10 mg via INTRAVENOUS
  Filled 2023-10-04: qty 10

## 2023-10-04 MED ORDER — SODIUM CHLORIDE 0.9 % IV SOLN: Freq: Once | INTRAVENOUS | Status: AC

## 2023-10-04 MED ORDER — SODIUM CHLORIDE 0.9 % IV SOLN
420.0000 mg | Freq: Once | INTRAVENOUS | Status: AC
  Administered 2023-10-04: 420 mg via INTRAVENOUS
  Filled 2023-10-04: qty 14

## 2023-10-04 MED ORDER — SODIUM CHLORIDE 0.9 % IV SOLN
150.0000 mg | Freq: Once | INTRAVENOUS | Status: AC
  Administered 2023-10-04: 150 mg via INTRAVENOUS
  Filled 2023-10-04: qty 150

## 2023-10-04 MED ORDER — ACETAMINOPHEN 325 MG PO TABS
650.0000 mg | ORAL_TABLET | Freq: Once | ORAL | Status: AC
  Administered 2023-10-04: 650 mg via ORAL
  Filled 2023-10-04: qty 2

## 2023-10-04 MED ORDER — DIPHENHYDRAMINE HCL 25 MG PO CAPS
50.0000 mg | ORAL_CAPSULE | Freq: Once | ORAL | Status: AC
  Administered 2023-10-04: 50 mg via ORAL
  Filled 2023-10-04: qty 2

## 2023-10-04 MED ORDER — SODIUM CHLORIDE 0.9 % IV SOLN
450.0000 mg | Freq: Once | INTRAVENOUS | Status: AC
  Administered 2023-10-04: 450 mg via INTRAVENOUS
  Filled 2023-10-04: qty 45

## 2023-10-04 MED ORDER — SODIUM CHLORIDE 0.9 % IV SOLN
63.7500 mg/m2 | Freq: Once | INTRAVENOUS | Status: AC
  Administered 2023-10-04: 129 mg via INTRAVENOUS
  Filled 2023-10-04: qty 12.9

## 2023-10-04 MED ORDER — SODIUM CHLORIDE 0.9% FLUSH
10.0000 mL | INTRAVENOUS | Status: DC | PRN
  Administered 2023-10-04: 10 mL

## 2023-10-04 NOTE — Progress Notes (Signed)
Patient had fair tolerance of first treatment cycle. She will proceed with cycle two and knows to call the office with any side effects.   Oncology Nurse Navigator Documentation     10/04/2023    2:00 PM  Oncology Nurse Navigator Flowsheets  Navigator Follow Up Date: 10/25/2023  Navigator Follow Up Reason: Follow-up Appointment;Chemotherapy  Navigator Location [facility]  Navigator Encounter Type Appt/Treatment Plan Review  Patient Visit Type MedOnc  Treatment Phase Active Tx  Barriers/Navigation Needs Coordination of Care;Education  Interventions None Required  Acuity Level 2-Minimal Needs (1-2 Barriers Identified)  Support Groups/Services Friends and Family  Time Spent with Patient 15

## 2023-10-04 NOTE — Patient Instructions (Addendum)
[facility]  Discharge Instructions: Thank you for choosing [facility] to provide your oncology and hematology care.   If you have a lab appointment with the Cancer Center, please go directly to the Cancer Center and check in at the registration area.  Wear comfortable clothing and clothing appropriate for easy access to any Portacath or PICC line.   We strive to give you quality time with your provider. You may need to reschedule your appointment if you arrive late (15 or more minutes).  Arriving late affects you and other patients whose appointments are after yours.  Also, if you miss three or more appointments without notifying the office, you may be dismissed from the clinic at the provider's discretion.      For prescription refill requests, have your pharmacy contact our office and allow 72 hours for refills to be completed.    Today you received the following chemotherapy and/or immunotherapy agents:  Taxotere, Carboplatin, Kanjinti and Perjeta.       To help prevent nausea and vomiting after your treatment, we encourage you to take your nausea medication as directed.  BELOW ARE SYMPTOMS THAT SHOULD BE REPORTED IMMEDIATELY: *FEVER GREATER THAN 100.4 F (38 C) OR HIGHER *CHILLS OR SWEATING *NAUSEA AND VOMITING THAT IS NOT CONTROLLED WITH YOUR NAUSEA MEDICATION *UNUSUAL SHORTNESS OF BREATH *UNUSUAL BRUISING OR BLEEDING *URINARY PROBLEMS (pain or burning when urinating, or frequent urination) *BOWEL PROBLEMS (unusual diarrhea, constipation, pain near the anus) TENDERNESS IN MOUTH AND THROAT WITH OR WITHOUT PRESENCE OF ULCERS (sore throat, sores in mouth, or a toothache) UNUSUAL RASH, SWELLING OR PAIN  UNUSUAL VAGINAL DISCHARGE OR ITCHING   Items with * indicate a potential emergency and should be followed up as soon as possible or go to the Emergency Department if any problems should occur.  Please show the CHEMOTHERAPY ALERT CARD  or IMMUNOTHERAPY ALERT CARD at check-in to the Emergency Department and triage nurse. Should you have questions after your visit or need to cancel or reschedule your appointment, please contact [facility] [place]  [phone] and follow the prompts.  Office hours are 8:00 a.m. to 4:30 p.m. Monday - Friday. Please note that voicemails left after 4:00 p.m. may not be returned until the following business day.  We are closed weekends and major holidays. You have access to a nurse at all times for urgent questions. Please call the main number to the clinic [phone] and follow the prompts.  For any non-urgent questions, you may also contact your provider using MyChart. We now offer e-Visits for anyone 12 and older to request care online for non-urgent symptoms. For details visit [email or web address].   Also download the MyChart app! Go to the app store, search "MyChart", open the app, select [place], and log in with your MyChart username and password.

## 2023-10-04 NOTE — Progress Notes (Signed)
[facility] H[facility]ology [facility] Oncology Follow Up Visit  Brandi Roberts [id number] 08-Dec-1956 67 y.o. 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  She had problems with diarrhea with the first cycle of chemotherapy.  This may have been from the Perjeta.  If this happens again, they will had to maybe omit the Perjeta.  She is feeling better right now.  She really has no specific complaints.  She says that where she had the lymph node taken out from the right axilla, this is going much better..  She has had no nausea or vomiting.  She has had no mouth sores.  There is been no leg swelling.  She has had no rashes.  There has been no obvious bleeding.  Currently, I would have said that her performance status is probably ECOG 1.    Medications:  Allergies as of 10/04/2023       Reactions   Levaquin [levofloxacin] Nausea And Vomiting, Other (See Comments)   dizziness        Medication List        Accurate as of October 04, 2023 10:24 AM. If you have any questions, ask your nurse or doctor.          albuterol 108 (90 Base) MCG/ACT inhaler Commonly known as: VENTOLIN HFA Inhale 2 puffs into the lungs every 6 (six) hours as needed.   ALPRAZolam 0.25 MG tablet Commonly known as: XANAX TAKE 1 TABLET BY MOUTH 3 TIMES DAILY AS NEEDED FOR ANXIETY.   amLODipine 10 MG tablet Commonly known as: NORVASC Take 10 mg by mouth daily.   atorvastatin 10 MG tablet Commonly known as: LIPITOR Take 5 mg by mouth at bedtime.   benazepril 40 MG tablet Commonly known as: LOTENSIN Take 40 mg by mouth daily.   dexamethasone 4  MG tablet Commonly known as: DECADRON Take 2 tabs by mouth 2 times daily starting day before chemo. Then take 2 tabs daily for 2 days starting day after chemo. Take with food.   doxazosin 2 MG tablet Commonly known as: CARDURA Take 2 mg by mouth 2 (two) times daily.   Eliquis 5 MG Tabs tablet Generic drug: apixaban Take 5 mg by mouth 2 (two) times daily.   gabapentin 600 MG tablet Commonly known as: NEURONTIN Take 600 mg by mouth at bedtime.   hydrALAZINE 50 MG tablet Commonly known as: APRESOLINE Take 50 mg by mouth 2 (two) times daily.   Iron 325 (65 Fe) MG Tabs Take 1 tablet by mouth daily.   lidocaine-prilocaine cream Commonly known as: EMLA Apply to affected area once   magic mouthwash w/lidocaine Soln Swish and spit 5 mls by mouth before meals and at bedtime for mouth sores.   lidocaine-diphenhydrAMINE-alum & mag hydroxide-simeth-nystatin Swish and spit 5 mLs by mouth 4 (four) times daily -  before meals and at bedtime for mouth sores.   metFORMIN 1000 MG tablet Commonly known as: GLUCOPHAGE Take 1,000 mg by mouth 2 (two) times daily.   metoprolol succinate 100 MG 24 hr tablet Commonly known as: TOPROL-XL Take 100 mg by mouth daily.   metroNIDAZOLE 500 MG  tablet Commonly known as: FLAGYL Take 1 tablet (500 mg total) by mouth 3 (three) times daily.   ondansetron 8 MG tablet Commonly known as: Zofran Take 1 tablet (8 mg total) by mouth every 8 (eight) hours as needed for nausea or vomiting. Start on the third day after chemotherapy.   oxyCODONE 5 MG immediate release tablet Commonly known as: Oxy IR/ROXICODONE Take 1 tablet (5 mg total) by mouth every 6 (six) hours as needed for moderate pain, severe pain or breakthrough pain.   prochlorperazine 10 MG tablet Commonly known as: COMPAZINE Take 1 tablet (10 mg total) by mouth every 6 (six) hours as needed for nausea or vomiting.   triamterene-hydrochlorothiazide 75-50 MG tablet Commonly known as:  MAXZIDE Take 1 tablet by mouth daily.   Vitamin D (Ergocalciferol) 1.25 MG (50000 UNIT) Caps capsule Commonly known as: DRISDOL Take 50,000 Units by mouth once a week.        Allergies:  Allergies  Allergen Reactions   Levaquin [Levofloxacin] Nausea And Vomiting and Other (See Comments)    dizziness    Past Medical History, Surgical history, Social history, and Family History were reviewed and updated.  Review of Systems: Review of Systems  Constitutional: Negative.   HENT: Negative.    Eyes: Negative.   Respiratory: Negative.    Cardiovascular: Negative.   Gastrointestinal: Negative.   Genitourinary: Negative.   Musculoskeletal: Negative.   Skin: Negative.   Neurological: Negative.   Endo/Heme/Allergies: Negative.   Psychiatric/Behavioral: Negative.       Physical Exam:  height is 5\' 6"  (1.676 m) and weight is 193 lb (87.5 kg). Her oral temperature is 98.3 F (36.8 C). Her blood pressure is 163/54 (abnormal) and her pulse is 68. Her respiration is 18 and oxygen saturation is 100%.   Wt Readings from Last 3 Encounters:  10/04/23 193 lb (87.5 kg)  09/07/23 195 lb (88.5 kg)  08/23/23 197 lb 8.5 oz (89.6 kg)    Physical Exam Vitals reviewed.  Constitutional:      Comments: Her breast exam shows left breast with no masses, edema or erythema.  There is no left axillary adenopathy.  Right chest wall shows healed mastectomy scar.  There is little bit of hyperpigmentation.  She has had no masses.  There is no erythema.  There is no right axillary adenopathy.  HENT:     Head: Normocephalic and atraumatic.  Eyes:     Pupils: Pupils are equal, round, and reactive to light.  Cardiovascular:     Rate and Rhythm: Normal rate and regular rhythm.     Heart sounds: Normal heart sounds.  Pulmonary:     Effort: Pulmonary effort is normal.     Breath sounds: Normal breath sounds.  Abdominal:     General: Bowel sounds are normal.     Palpations: Abdomen is soft.   Musculoskeletal:        General: No tenderness or deformity. Normal range of motion.     Cervical back: Normal range of motion.  Lymphadenopathy:     Cervical: No cervical adenopathy.  Skin:    General: Skin is warm and dry.     Findings: No erythema or rash.  Neurological:     Mental Status: She is alert and oriented to person, place, and time.  Psychiatric:        Behavior: Behavior normal.        Thought Content: Thought content normal.        Judgment: Judgment normal.  Lab Results  Component Value Date   WBC 9.7 10/04/2023   HGB 9.3 (L) 10/04/2023   HCT 27.8 (L) 10/04/2023   MCV 91.1 10/04/2023   PLT 175 10/04/2023   Lab Results  Component Value Date   FERRITIN 258 01/02/2023   IRON 105 01/02/2023   TIBC 354 01/02/2023   UIBC 249 01/02/2023   IRONPCTSAT 30 01/02/2023   Lab Results  Component Value Date   RBC 3.05 (L) 10/04/2023   No results found for: "KPAFRELGTCHN", "LAMBDASER", "KAPLAMBRATIO" No results found for: "IGGSERUM", "IGA", "IGMSERUM" No results found for: "TOTALPROTELP", "ALBUMINELP", "A1GS", "A2GS", "BETS", "BETA2SER", "GAMS", "MSPIKE", "SPEI"   Chemistry      Component Value Date/Time   NA 137 10/04/2023 0913   K 3.7 10/04/2023 0913   CL 103 10/04/2023 0913   CO2 27 10/04/2023 0913   BUN 23 10/04/2023 0913   CREATININE 1.31 (H) 10/04/2023 0913      Component Value Date/Time   CALCIUM 8.8 (L) 10/04/2023 0913   ALKPHOS 66 10/04/2023 0913   AST 19 10/04/2023 0913   ALT 12 10/04/2023 0913   BILITOT 0.3 10/04/2023 0913       Impression and Plan: Brandi Roberts is a pleasant 68 yo African American female with invasive ductal carcinoma of the right breast, ER-/PR-/HER2 + treated with mastectomy.   She had 1 positive lymph node.  Unfortunately, she recurred quickly after anti-HER2 therapy.  We will go ahead with her second cycle of TCHP.  Again, we will see how she does with this.  Hopefully she will not have the diarrhea that she had with  the first cycle.  I told her that if she begins to have problems to give [place] a call so we get her in here so we can do IV fluids..  She has she said that the Flagyl I gave her seem to help.  I think if she has diarrhea again, we can certainly get her back on Flagyl.  I will have her come back to see [place] in another 3 weeks.   Josph Macho, MD 10/9/202410:24 AM

## 2023-10-05 LAB — CANCER ANTIGEN 27.29: CA 27.29: 33.9 U/mL (ref 0.0–38.6)

## 2023-10-05 LAB — ERYTHROPOIETIN: Erythropoietin: 10.7 m[IU]/mL (ref 2.6–18.5)

## 2023-10-06 ENCOUNTER — Other Ambulatory Visit (HOSPITAL_BASED_OUTPATIENT_CLINIC_OR_DEPARTMENT_OTHER): Payer: Self-pay

## 2023-10-06 ENCOUNTER — Other Ambulatory Visit: Payer: Self-pay | Admitting: *Deleted

## 2023-10-06 ENCOUNTER — Inpatient Hospital Stay: Payer: Medicare Other

## 2023-10-06 VITALS — BP 119/71 | HR 81 | Temp 98.4°F | Resp 18

## 2023-10-06 DIAGNOSIS — Z5112 Encounter for antineoplastic immunotherapy: Secondary | ICD-10-CM | POA: Diagnosis not present

## 2023-10-06 DIAGNOSIS — C50911 Malignant neoplasm of unspecified site of right female breast: Secondary | ICD-10-CM

## 2023-10-06 MED ORDER — PEGFILGRASTIM-CBQV 6 MG/0.6ML ~~LOC~~ SOSY
6.0000 mg | PREFILLED_SYRINGE | Freq: Once | SUBCUTANEOUS | Status: AC
  Administered 2023-10-06: 6 mg via SUBCUTANEOUS
  Filled 2023-10-06: qty 0.6

## 2023-10-06 MED ORDER — ALPRAZOLAM 0.25 MG PO TABS: 0.2500 mg | ORAL_TABLET | Freq: Three times a day (TID) | ORAL | 0 refills | Status: DC | PRN

## 2023-10-06 NOTE — Patient Instructions (Signed)
udenycaPegfilgrastim Injection What is this medication? PEGFILGRASTIM (PEG fil gra stim) lowers the risk of infection in people who are receiving chemotherapy. It works by Systems analyst make more white blood cells, which protects your body from infection. It may also be used to help people who have been exposed to high doses of radiation. This medicine may be used for other purposes; ask your health care provider or pharmacist if you have questions. COMMON BRAND NAME(S): Cherly Hensen, Neulasta, Nyvepria, Stimufend, UDENYCA, UDENYCA ONBODY, Ziextenzo What should I tell my care team before I take this medication? They need to know if you have any of these conditions: Kidney disease Latex allergy Ongoing radiation therapy Sickle cell disease Skin reactions to acrylic adhesives (On-Body Injector only) An unusual or allergic reaction to pegfilgrastim, filgrastim, other medications, foods, dyes, or preservatives Pregnant or trying to get pregnant Breast-feeding How should I use this medication? This medication is for injection under the skin. If you get this medication at home, you will be taught how to prepare and give the pre-filled syringe or how to use the On-body Injector. Refer to the patient Instructions for Use for detailed instructions. Use exactly as directed. Tell your care team immediately if you suspect that the On-body Injector may not have performed as intended or if you suspect the use of the On-body Injector resulted in a missed or partial dose. It is important that you put your used needles and syringes in a special sharps container. Do not put them in a trash can. If you do not have a sharps container, call your pharmacist or care team to get one. Talk to your care team about the use of this medication in children. While this medication may be prescribed for selected conditions, precautions do apply. Overdosage: If you think you have taken too much of this medicine contact a  poison control center or emergency room at once. NOTE: This medicine is only for you. Do not share this medicine with others. What if I miss a dose? It is important not to miss your dose. Call your care team if you miss your dose. If you miss a dose due to an On-body Injector failure or leakage, a new dose should be administered as soon as possible using a single prefilled syringe for manual use. What may interact with this medication? Interactions have not been studied. This list may not describe all possible interactions. Give your health care provider a list of all the medicines, herbs, non-prescription drugs, or dietary supplements you use. Also tell them if you smoke, drink alcohol, or use illegal drugs. Some items may interact with your medicine. What should I watch for while using this medication? Your condition will be monitored carefully while you are receiving this medication. You may need blood work done while you are taking this medication. Talk to your care team about your risk of cancer. You may be more at risk for certain types of cancer if you take this medication. If you are going to need a MRI, CT scan, or other procedure, tell your care team that you are using this medication (On-Body Injector only). What side effects may I notice from receiving this medication? Side effects that you should report to your care team as soon as possible: Allergic reactions--skin rash, itching, hives, swelling of the face, lips, tongue, or throat Capillary leak syndrome--stomach or muscle pain, unusual weakness or fatigue, feeling faint or lightheaded, decrease in the amount of urine, swelling of the ankles, hands, or  feet, trouble breathing High white blood cell level--fever, fatigue, trouble breathing, night sweats, change in vision, weight loss Inflammation of the aorta--fever, fatigue, back, chest, or stomach pain, severe headache Kidney injury (glomerulonephritis)--decrease in the amount of  urine, red or dark brown urine, foamy or bubbly urine, swelling of the ankles, hands, or feet Shortness of breath or trouble breathing Spleen injury--pain in upper left stomach or shoulder Unusual bruising or bleeding Side effects that usually do not require medical attention (report to your care team if they continue or are bothersome): Bone pain Pain in the hands or feet This list may not describe all possible side effects. Call your doctor for medical advice about side effects. You may report side effects to FDA at 1-800-FDA-1088. Where should I keep my medication? Keep out of the reach of children. If you are using this medication at home, you will be instructed on how to store it. Throw away any unused medication after the expiration date on the label. NOTE: This sheet is a summary. It may not cover all possible information. If you have questions about this medicine, talk to your doctor, pharmacist, or health care provider.  2024 Elsevier/Gold Standard (2021-11-12 00:00:00)

## 2023-10-09 ENCOUNTER — Other Ambulatory Visit: Payer: Self-pay | Admitting: *Deleted

## 2023-10-09 DIAGNOSIS — B372 Candidiasis of skin and nail: Secondary | ICD-10-CM

## 2023-10-09 MED ORDER — FLUCONAZOLE 200 MG PO TABS
200.0000 mg | ORAL_TABLET | Freq: Every day | ORAL | 0 refills | Status: DC
Start: 2023-10-09 — End: 2024-03-14

## 2023-10-09 NOTE — Telephone Encounter (Signed)
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 I have been cleaning it with soap and peroxide. What else can I do to make it go away?" Per Dr. Myna Hidalgo, start Diflucan 200 mg po daily until she sees me again. Also, do not clean the area with Peroxide. It's OK to shower with soap and then keep it dry. She verbalized understanding.

## 2023-10-11 ENCOUNTER — Other Ambulatory Visit: Payer: Self-pay

## 2023-10-25 ENCOUNTER — Inpatient Hospital Stay: Payer: Medicare Other

## 2023-10-25 ENCOUNTER — Ambulatory Visit: Payer: Medicare Other | Admitting: Hematology & Oncology

## 2023-10-25 ENCOUNTER — Other Ambulatory Visit: Payer: Medicare Other

## 2023-10-25 ENCOUNTER — Ambulatory Visit: Payer: Medicare Other

## 2023-10-27 ENCOUNTER — Ambulatory Visit: Payer: Medicare Other

## 2023-10-31 ENCOUNTER — Encounter: Payer: Self-pay | Admitting: Hematology & Oncology

## 2023-10-31 ENCOUNTER — Inpatient Hospital Stay (HOSPITAL_BASED_OUTPATIENT_CLINIC_OR_DEPARTMENT_OTHER): Payer: Medicare Other | Admitting: Hematology & Oncology

## 2023-10-31 ENCOUNTER — Inpatient Hospital Stay: Payer: Medicare Other | Attending: Hematology & Oncology

## 2023-10-31 ENCOUNTER — Encounter: Payer: Self-pay | Admitting: *Deleted

## 2023-10-31 ENCOUNTER — Other Ambulatory Visit: Payer: Self-pay | Admitting: Oncology

## 2023-10-31 ENCOUNTER — Inpatient Hospital Stay: Payer: Medicare Other

## 2023-10-31 ENCOUNTER — Other Ambulatory Visit: Payer: Self-pay

## 2023-10-31 VITALS — BP 130/50 | HR 66

## 2023-10-31 VITALS — BP 140/51 | HR 64 | Temp 97.8°F | Resp 18 | Ht 66.0 in | Wt 194.0 lb

## 2023-10-31 DIAGNOSIS — R197 Diarrhea, unspecified: Secondary | ICD-10-CM | POA: Diagnosis not present

## 2023-10-31 DIAGNOSIS — C773 Secondary and unspecified malignant neoplasm of axilla and upper limb lymph nodes: Secondary | ICD-10-CM | POA: Insufficient documentation

## 2023-10-31 DIAGNOSIS — C50911 Malignant neoplasm of unspecified site of right female breast: Secondary | ICD-10-CM | POA: Insufficient documentation

## 2023-10-31 DIAGNOSIS — Z9011 Acquired absence of right breast and nipple: Secondary | ICD-10-CM | POA: Diagnosis not present

## 2023-10-31 DIAGNOSIS — Z171 Estrogen receptor negative status [ER-]: Secondary | ICD-10-CM | POA: Insufficient documentation

## 2023-10-31 DIAGNOSIS — T451X5A Adverse effect of antineoplastic and immunosuppressive drugs, initial encounter: Secondary | ICD-10-CM | POA: Diagnosis not present

## 2023-10-31 DIAGNOSIS — Z5111 Encounter for antineoplastic chemotherapy: Secondary | ICD-10-CM | POA: Diagnosis present

## 2023-10-31 DIAGNOSIS — Z5189 Encounter for other specified aftercare: Secondary | ICD-10-CM | POA: Diagnosis not present

## 2023-10-31 DIAGNOSIS — Z7984 Long term (current) use of oral hypoglycemic drugs: Secondary | ICD-10-CM | POA: Diagnosis not present

## 2023-10-31 DIAGNOSIS — Z5112 Encounter for antineoplastic immunotherapy: Secondary | ICD-10-CM | POA: Diagnosis present

## 2023-10-31 DIAGNOSIS — D6481 Anemia due to antineoplastic chemotherapy: Secondary | ICD-10-CM | POA: Diagnosis not present

## 2023-10-31 DIAGNOSIS — D631 Anemia in chronic kidney disease: Secondary | ICD-10-CM

## 2023-10-31 HISTORY — DX: Anemia in chronic kidney disease: D63.1

## 2023-10-31 LAB — CMP (CANCER CENTER ONLY)
ALT: 11 U/L (ref 0–44)
AST: 18 U/L (ref 15–41)
Albumin: 3.9 g/dL (ref 3.5–5.0)
Alkaline Phosphatase: 60 U/L (ref 38–126)
Anion gap: 6 (ref 5–15)
BUN: 28 mg/dL — ABNORMAL HIGH (ref 8–23)
CO2: 27 mmol/L (ref 22–32)
Calcium: 9.1 mg/dL (ref 8.9–10.3)
Chloride: 103 mmol/L (ref 98–111)
Creatinine: 1.54 mg/dL — ABNORMAL HIGH (ref 0.44–1.00)
GFR, Estimated: 36 mL/min — ABNORMAL LOW (ref >60)
Glucose, Bld: 104 mg/dL — ABNORMAL HIGH (ref 70–99)
Potassium: 3.9 mmol/L (ref 3.5–5.1)
Sodium: 136 mmol/L (ref 135–145)
Total Bilirubin: 0.3 mg/dL (ref <1.2)
Total Protein: 6.8 g/dL (ref 6.5–8.1)

## 2023-10-31 LAB — IRON AND IRON BINDING CAPACITY (CC-WL,HP ONLY)
Iron: 57 ug/dL (ref 28–170)
Saturation Ratios: 17 % (ref 10.4–31.8)
TIBC: 343 ug/dL (ref 250–450)
UIBC: 286 ug/dL (ref 148–442)

## 2023-10-31 LAB — FERRITIN: Ferritin: 178 ng/mL (ref 11–307)

## 2023-10-31 LAB — CBC WITH DIFFERENTIAL (CANCER CENTER ONLY)
Abs Immature Granulocytes: 0.02 10*3/uL (ref 0.00–0.07)
Basophils Absolute: 0 10*3/uL (ref 0.0–0.1)
Basophils Relative: 0 %
Eosinophils Absolute: 0 10*3/uL (ref 0.0–0.5)
Eosinophils Relative: 1 %
HCT: 24.8 % — ABNORMAL LOW (ref 36.0–46.0)
Hemoglobin: 8.3 g/dL — ABNORMAL LOW (ref 12.0–15.0)
Immature Granulocytes: 0 %
Lymphocytes Relative: 22 %
Lymphs Abs: 1.6 10*3/uL (ref 0.7–4.0)
MCH: 31.7 pg (ref 26.0–34.0)
MCHC: 33.5 g/dL (ref 30.0–36.0)
MCV: 94.7 fL (ref 80.0–100.0)
Monocytes Absolute: 0.6 10*3/uL (ref 0.1–1.0)
Monocytes Relative: 9 %
Neutro Abs: 4.9 10*3/uL (ref 1.7–7.7)
Neutrophils Relative %: 68 %
Platelet Count: 154 10*3/uL (ref 150–400)
RBC: 2.62 MIL/uL — ABNORMAL LOW (ref 3.87–5.11)
RDW: 15 % (ref 11.5–15.5)
WBC Count: 7.2 10*3/uL (ref 4.0–10.5)
nRBC: 0 % (ref 0.0–0.2)

## 2023-10-31 LAB — LACTATE DEHYDROGENASE: LDH: 192 U/L (ref 98–192)

## 2023-10-31 MED ORDER — DIPHENHYDRAMINE HCL 25 MG PO CAPS
50.0000 mg | ORAL_CAPSULE | Freq: Once | ORAL | Status: AC
Start: 2023-10-31 — End: 2023-10-31
  Administered 2023-10-31: 50 mg via ORAL
  Filled 2023-10-31: qty 2

## 2023-10-31 MED ORDER — LOPERAMIDE HCL 1 MG/7.5ML PO SUSP: 2.0000 mg | ORAL | 2 refills | Status: AC | PRN

## 2023-10-31 MED ORDER — SODIUM CHLORIDE 0.9 % IV SOLN
63.7500 mg/m2 | Freq: Once | INTRAVENOUS | Status: AC
  Administered 2023-10-31: 129 mg via INTRAVENOUS
  Filled 2023-10-31: qty 12.9

## 2023-10-31 MED ORDER — DEXAMETHASONE SODIUM PHOSPHATE 10 MG/ML IJ SOLN
10.0000 mg | Freq: Once | INTRAMUSCULAR | Status: AC
  Administered 2023-10-31: 10 mg via INTRAVENOUS
  Filled 2023-10-31: qty 1

## 2023-10-31 MED ORDER — SODIUM CHLORIDE 0.9 % IV SOLN
10.0000 mg | Freq: Once | INTRAVENOUS | Status: DC
Start: 2023-10-31 — End: 2023-10-31

## 2023-10-31 MED ORDER — HEPARIN SOD (PORK) LOCK FLUSH 100 UNIT/ML IV SOLN
500.0000 [IU] | Freq: Once | INTRAVENOUS | Status: DC
Start: 2023-10-31 — End: 2023-10-31

## 2023-10-31 MED ORDER — SODIUM CHLORIDE 0.9 % IV SOLN
150.0000 mg | Freq: Once | INTRAVENOUS | Status: AC
  Administered 2023-10-31: 150 mg via INTRAVENOUS
  Filled 2023-10-31: qty 150

## 2023-10-31 MED ORDER — PALONOSETRON HCL INJECTION 0.25 MG/5ML
0.2500 mg | Freq: Once | INTRAVENOUS | Status: AC
Start: 2023-10-31 — End: 2023-10-31
  Administered 2023-10-31: 0.25 mg via INTRAVENOUS
  Filled 2023-10-31: qty 5

## 2023-10-31 MED ORDER — ACETAMINOPHEN 325 MG PO TABS
650.0000 mg | ORAL_TABLET | Freq: Once | ORAL | Status: AC
  Administered 2023-10-31: 650 mg via ORAL
  Filled 2023-10-31: qty 2

## 2023-10-31 MED ORDER — HEPARIN SOD (PORK) LOCK FLUSH 100 UNIT/ML IV SOLN
500.0000 [IU] | Freq: Once | INTRAVENOUS | Status: AC | PRN
  Administered 2023-10-31: 500 [IU]

## 2023-10-31 MED ORDER — SODIUM CHLORIDE 0.9 % IV SOLN
450.0000 mg | Freq: Once | INTRAVENOUS | Status: AC
  Administered 2023-10-31: 450 mg via INTRAVENOUS
  Filled 2023-10-31: qty 45

## 2023-10-31 MED ORDER — SODIUM CHLORIDE 0.9 % IV SOLN: Freq: Once | INTRAVENOUS | Status: AC

## 2023-10-31 MED ORDER — TRASTUZUMAB-ANNS CHEMO 150 MG IV SOLR
6.0000 mg/kg | Freq: Once | INTRAVENOUS | Status: AC
  Administered 2023-10-31: 525 mg via INTRAVENOUS
  Filled 2023-10-31: qty 25

## 2023-10-31 MED ORDER — SODIUM CHLORIDE 0.9% FLUSH
10.0000 mL | INTRAVENOUS | Status: DC | PRN
Start: 2023-10-31 — End: 2023-10-31
  Administered 2023-10-31: 10 mL

## 2023-10-31 MED ORDER — ALPRAZOLAM 0.25 MG PO TABS: 0.2500 mg | ORAL_TABLET | Freq: Three times a day (TID) | ORAL | 0 refills | Status: DC | PRN

## 2023-10-31 MED ORDER — DARBEPOETIN ALFA 300 MCG/0.6ML IJ SOSY
300.0000 ug | PREFILLED_SYRINGE | Freq: Once | INTRAMUSCULAR | Status: AC
  Administered 2023-10-31: 300 ug via SUBCUTANEOUS
  Filled 2023-10-31: qty 0.6

## 2023-10-31 MED ORDER — SODIUM CHLORIDE 0.9% FLUSH: 10.0000 mL | INTRAVENOUS | Status: DC | PRN

## 2023-10-31 MED ORDER — SODIUM CHLORIDE 0.9 % IV SOLN
420.0000 mg | Freq: Once | INTRAVENOUS | Status: AC
  Administered 2023-10-31: 420 mg via INTRAVENOUS
  Filled 2023-10-31: qty 14

## 2023-10-31 NOTE — Patient Instructions (Signed)
[facility] - A DEPT OF [facility]  Discharge Instructions: Thank you for choosing [facility] to provide your oncology and hematology care.   If you have a lab appointment with the Cancer Center, please go directly to the Cancer Center and check in at the registration area.  Wear comfortable clothing and clothing appropriate for easy access to any Portacath or PICC line.   We strive to give you quality time with your provider. You may need to reschedule your appointment if you arrive late (15 or more minutes).  Arriving late affects you and other patients whose appointments are after yours.  Also, if you miss three or more appointments without notifying the office, you may be dismissed from the clinic at the provider's discretion.      For prescription refill requests, have your pharmacy contact our office and allow 72 hours for refills to be completed.    Today you received the following chemotherapy and/or immunotherapy agents Kanjinti/Perjeta/Taxotere/Carboplatin      To help prevent nausea and vomiting after your treatment, we encourage you to take your nausea medication as directed.  BELOW ARE SYMPTOMS THAT SHOULD BE REPORTED IMMEDIATELY: *FEVER GREATER THAN 100.4 F (38 C) OR HIGHER *CHILLS OR SWEATING *NAUSEA AND VOMITING THAT IS NOT CONTROLLED WITH YOUR NAUSEA MEDICATION *UNUSUAL SHORTNESS OF BREATH *UNUSUAL BRUISING OR BLEEDING *URINARY PROBLEMS (pain or burning when urinating, or frequent urination) *BOWEL PROBLEMS (unusual diarrhea, constipation, pain near the anus) TENDERNESS IN MOUTH AND THROAT WITH OR WITHOUT PRESENCE OF ULCERS (sore throat, sores in mouth, or a toothache) UNUSUAL RASH, SWELLING OR PAIN  UNUSUAL VAGINAL DISCHARGE OR ITCHING   Items with * indicate a potential emergency and should be followed up as soon as possible or go to the Emergency Department if any problems should occur.  Please show the CHEMOTHERAPY  ALERT CARD or IMMUNOTHERAPY ALERT CARD at check-in to the Emergency Department and triage nurse. Should you have questions after your visit or need to cancel or reschedule your appointment, please contact [facility] - A DEPT OF [facility]  [phone] and follow the prompts.  Office hours are 8:00 a.m. to 4:30 p.m. Monday - Friday. Please note that voicemails left after 4:00 p.m. may not be returned until the following business day.  We are closed weekends and major holidays. You have access to a nurse at all times for urgent questions. Please call the main number to the clinic [phone] and follow the prompts.  For any non-urgent questions, you may also contact your provider using MyChart. We now offer e-Visits for anyone 71 and older to request care online for non-urgent symptoms. For details visit [email or web address].   Also download the MyChart app! Go to the app store, search "MyChart", open the app, select [place], and log in with your MyChart username and password.

## 2023-10-31 NOTE — Progress Notes (Unsigned)
Patient will proceed with cycle three today.   Oncology Nurse Navigator Documentation     10/31/2023    8:45 AM  Oncology Nurse Navigator Flowsheets  Navigator Follow Up Date: 11/20/2023  Navigator Follow Up Reason: Follow-up Appointment;Chemotherapy  Navigator Location [facility]  Navigator Encounter Type Treatment;Appt/Treatment Plan Review  Patient Visit Type MedOnc  Treatment Phase Active Tx  Barriers/Navigation Needs Coordination of Care;Education  Interventions Psycho-Social Support  Acuity Level 2-Minimal Needs (1-2 Barriers Identified)  Support Groups/Services Friends and Family  Time Spent with Patient 15

## 2023-10-31 NOTE — Progress Notes (Signed)
Ok to treat with Hgb 8.3 and Creatinine 1.54 per Dr. Myna Hidalgo

## 2023-10-31 NOTE — Progress Notes (Signed)
K. Hematology and Oncology Follow Up Visit  Makiyah Zentz [id number] [birth year] 67 y.o. 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 Chimene Salo is here today for follow-up.  Diarrhea was clearly her big problem with the first cycle of treatment.  We will go ahead and give her a prescription for Imodium suspension.  Hopefully, this will help with the diarrhea.  Otherwise, she really feels okay.  She has had no mouth sores.  She has had no nausea or vomiting.  She has little bit of discomfort under the right shoulder blade.  There is been no fever.  She has had no bleeding.  There is been no headache.  Currently, I would have said that her performance status is probably ECOG 1.   Medications:  Allergies as of 10/31/2023       Reactions   Levaquin [levofloxacin] Nausea And Vomiting, Other (See Comments)   dizziness        Medication List        Accurate as of October 31, 2023  8:47 AM. If you have any questions, ask your nurse or doctor.          albuterol 108 (90 Base) MCG/ACT inhaler Commonly known as: VENTOLIN HFA Inhale 2 puffs into the lungs every 6 (six) hours as needed.   ALPRAZolam 0.25 MG tablet Commonly known as: XANAX Take 1 tablet (0.25 mg total) by mouth 3 (three) times daily as needed for anxiety.   amLODipine 10 MG tablet Commonly known as: NORVASC Take 10 mg by mouth daily.   atorvastatin 10 MG tablet Commonly known as: LIPITOR Take 5 mg by mouth at bedtime.   benazepril 40 MG tablet Commonly known as: LOTENSIN Take 40 mg by mouth daily.   dexamethasone 4 MG tablet Commonly known as: DECADRON Take 2  tabs by mouth 2 times daily starting day before chemo. Then take 2 tabs daily for 2 days starting day after chemo. Take with food.   doxazosin 2 MG tablet Commonly known as: CARDURA Take 2 mg by mouth 2 (two) times daily.   Eliquis 5 MG Tabs tablet Generic drug: apixaban Take 5 mg by mouth 2 (two) times daily.   fluconazole 200 MG tablet Commonly known as: DIFLUCAN Take 1 tablet (200 mg total) by mouth daily.   gabapentin 600 MG tablet Commonly known as: NEURONTIN Take 600 mg by mouth at bedtime.   hydrALAZINE 50 MG tablet Commonly known as: APRESOLINE Take 50 mg by mouth 2 (two) times daily.   Iron 325 (65 Fe) MG Tabs Take 1 tablet by mouth daily.   lidocaine-prilocaine cream Commonly known as: EMLA Apply to affected area once   magic mouthwash w/lidocaine Soln Swish and spit 5 mls by mouth before meals and at bedtime for mouth sores.   lidocaine-diphenhydrAMINE-alum & mag hydroxide-simeth-nystatin Swish and spit 5 mLs by mouth 4 (four) times daily -  before meals and at bedtime for mouth sores.   metFORMIN 1000 MG tablet Commonly known as: GLUCOPHAGE Take 1,000 mg by mouth 2 (two) times daily.   metoprolol succinate 100 MG 24 hr tablet Commonly known as: TOPROL-XL Take 100 mg by mouth daily.  metroNIDAZOLE 500 MG tablet Commonly known as: FLAGYL Take 1 tablet (500 mg total) by mouth 3 (three) times daily.   ondansetron 8 MG tablet Commonly known as: Zofran Take 1 tablet (8 mg total) by mouth every 8 (eight) hours as needed for nausea or vomiting. Start on the third day after chemotherapy.   oxyCODONE 5 MG immediate release tablet Commonly known as: Oxy IR/ROXICODONE Take 1 tablet (5 mg total) by mouth every 6 (six) hours as needed for moderate pain, severe pain or breakthrough pain.   prochlorperazine 10 MG tablet Commonly known as: COMPAZINE Take 1 tablet (10 mg total) by mouth every 6 (six) hours as needed for nausea or vomiting.    triamterene-hydrochlorothiazide 75-50 MG tablet Commonly known as: MAXZIDE Take 1 tablet by mouth daily.   Vitamin D (Ergocalciferol) 1.25 MG (50000 UNIT) Caps capsule Commonly known as: DRISDOL Take 50,000 Units by mouth once a week.        Allergies:  Allergies  Allergen Reactions   Levaquin [Levofloxacin] Nausea And Vomiting and Other (See Comments)    dizziness    Past Medical History, Surgical history, Social history, and Family History were reviewed and updated.  Review of Systems: Review of Systems  Constitutional: Negative.   HENT: Negative.    Eyes: Negative.   Respiratory: Negative.    Cardiovascular: Negative.   Gastrointestinal: Negative.   Genitourinary: Negative.   Musculoskeletal: Negative.   Skin: Negative.   Neurological: Negative.   Endo/Heme/Allergies: Negative.   Psychiatric/Behavioral: Negative.       Physical Exam:  height is 5\' 6"  (1.676 m) and weight is 194 lb (88 kg). Her oral temperature is 97.8 F (36.6 C). Her blood pressure is 156/52 (abnormal) and her pulse is 64. Her respiration is 18 and oxygen saturation is 100%.   Wt Readings from Last 3 Encounters:  10/31/23 194 lb (88 kg)  10/04/23 193 lb (87.5 kg)  09/07/23 195 lb (88.5 kg)    Physical Exam Vitals reviewed.  Constitutional:      Comments: Her breast exam shows left breast with no masses, edema or erythema.  There is no left axillary adenopathy.  Right chest wall shows healed mastectomy scar.  There is little bit of hyperpigmentation.  She has had no masses.  There is no erythema.  There is no right axillary adenopathy.  HENT:     Head: Normocephalic and atraumatic.  Eyes:     Pupils: Pupils are equal, round, and reactive to light.  Cardiovascular:     Rate and Rhythm: Normal rate and regular rhythm.     Heart sounds: Normal heart sounds.  Pulmonary:     Effort: Pulmonary effort is normal.     Breath sounds: Normal breath sounds.  Abdominal:     General: Bowel sounds  are normal.     Palpations: Abdomen is soft.  Musculoskeletal:        General: No tenderness or deformity. Normal range of motion.     Cervical back: Normal range of motion.  Lymphadenopathy:     Cervical: No cervical adenopathy.  Skin:    General: Skin is warm and dry.     Findings: No erythema or rash.  Neurological:     Mental Status: She is alert and oriented to person, place, and time.  Psychiatric:        Behavior: Behavior normal.        Thought Content: Thought content normal.        Judgment: Judgment normal.  Lab Results  Component Value Date   WBC 7.2 10/31/2023   HGB 8.3 (L) 10/31/2023   HCT 24.8 (L) 10/31/2023   MCV 94.7 10/31/2023   PLT 154 10/31/2023   Lab Results  Component Value Date   FERRITIN 188 10/04/2023   IRON 67 10/04/2023   TIBC 328 10/04/2023   UIBC 261 10/04/2023   IRONPCTSAT 21 10/04/2023   Lab Results  Component Value Date   RBC 2.62 (L) 10/31/2023   No results found for: "KPAFRELGTCHN", "LAMBDASER", "KAPLAMBRATIO" No results found for: "IGGSERUM", "IGA", "IGMSERUM" No results found for: "TOTALPROTELP", "ALBUMINELP", "A1GS", "A2GS", "BETS", "BETA2SER", "GAMS", "MSPIKE", "SPEI"   Chemistry      Component Value Date/Time   NA 137 10/04/2023 0913   K 3.7 10/04/2023 0913   CL 103 10/04/2023 0913   CO2 27 10/04/2023 0913   BUN 23 10/04/2023 0913   CREATININE 1.31 (H) 10/04/2023 0913      Component Value Date/Time   CALCIUM 8.8 (L) 10/04/2023 0913   ALKPHOS 66 10/04/2023 0913   AST 19 10/04/2023 0913   ALT 12 10/04/2023 0913   BILITOT 0.3 10/04/2023 0913       Impression and Plan: Ms. Samuella Cota is a pleasant 67 yo African American female with invasive ductal carcinoma of the right breast, ER-/PR-/HER2 + treated with mastectomy.   She had 1 positive lymph node.  Unfortunately, she recurred quickly after anti-HER2 therapy.  We now have her on chemotherapy along with anti-HER2 therapy.  She has done pretty well.  Again we will  have to watch the diarrhea.  If she still has diarrhea with this cycle, then I will probably put her on some Lomotil.  I noted that she is quite anemic.  We are we will plan to give her some ESA.  We had checked her erythropoietin level in the past.  It is quite low.  I will see if an Aranesp can be given.  Will we will try to get this with every cycle.  She and her family are planning on going to [facility] for Thanksgiving.  I think we can push her next treatment back after Thanksgiving so she can enjoy the Thanksgiving holiday.  Hopefully, we will find that this will eradicate any microscopic disease that she has.   Josph Macho, MD 11/5/20248:47 AM

## 2023-11-01 ENCOUNTER — Encounter: Payer: Self-pay | Admitting: Hematology & Oncology

## 2023-11-02 ENCOUNTER — Ambulatory Visit: Payer: Medicare Other | Admitting: Hematology & Oncology

## 2023-11-02 ENCOUNTER — Other Ambulatory Visit: Payer: Self-pay

## 2023-11-02 ENCOUNTER — Other Ambulatory Visit: Payer: Medicare Other

## 2023-11-02 ENCOUNTER — Inpatient Hospital Stay: Payer: Medicare Other

## 2023-11-02 VITALS — HR 71 | Temp 97.8°F | Resp 18

## 2023-11-02 DIAGNOSIS — C50911 Malignant neoplasm of unspecified site of right female breast: Secondary | ICD-10-CM

## 2023-11-02 DIAGNOSIS — Z5112 Encounter for antineoplastic immunotherapy: Secondary | ICD-10-CM | POA: Diagnosis not present

## 2023-11-02 MED ORDER — PEGFILGRASTIM INJECTION 6 MG/0.6ML ~~LOC~~
6.0000 mg | PREFILLED_SYRINGE | Freq: Once | SUBCUTANEOUS | Status: AC
Start: 2023-11-02 — End: 2023-11-02
  Administered 2023-11-02: 6 mg via SUBCUTANEOUS
  Filled 2023-11-02: qty 0.6

## 2023-11-02 NOTE — Patient Instructions (Signed)

## 2023-11-20 ENCOUNTER — Inpatient Hospital Stay: Payer: Medicare Other

## 2023-11-20 ENCOUNTER — Inpatient Hospital Stay (HOSPITAL_BASED_OUTPATIENT_CLINIC_OR_DEPARTMENT_OTHER): Payer: Medicare Other | Admitting: Hematology & Oncology

## 2023-11-20 ENCOUNTER — Other Ambulatory Visit: Payer: Self-pay

## 2023-11-20 ENCOUNTER — Encounter: Payer: Self-pay | Admitting: Hematology & Oncology

## 2023-11-20 VITALS — BP 139/53 | HR 72 | Temp 98.5°F | Resp 16 | Ht 66.0 in | Wt 191.0 lb

## 2023-11-20 DIAGNOSIS — Z5112 Encounter for antineoplastic immunotherapy: Secondary | ICD-10-CM | POA: Diagnosis not present

## 2023-11-20 DIAGNOSIS — Z171 Estrogen receptor negative status [ER-]: Secondary | ICD-10-CM

## 2023-11-20 DIAGNOSIS — T451X5A Adverse effect of antineoplastic and immunosuppressive drugs, initial encounter: Secondary | ICD-10-CM

## 2023-11-20 DIAGNOSIS — C50911 Malignant neoplasm of unspecified site of right female breast: Secondary | ICD-10-CM

## 2023-11-20 DIAGNOSIS — Z9011 Acquired absence of right breast and nipple: Secondary | ICD-10-CM

## 2023-11-20 DIAGNOSIS — R197 Diarrhea, unspecified: Secondary | ICD-10-CM

## 2023-11-20 DIAGNOSIS — C773 Secondary and unspecified malignant neoplasm of axilla and upper limb lymph nodes: Secondary | ICD-10-CM

## 2023-11-20 DIAGNOSIS — D6481 Anemia due to antineoplastic chemotherapy: Secondary | ICD-10-CM

## 2023-11-20 LAB — CMP (CANCER CENTER ONLY)
ALT: 11 U/L (ref 0–44)
AST: 18 U/L (ref 15–41)
Albumin: 3.7 g/dL (ref 3.5–5.0)
Alkaline Phosphatase: 73 U/L (ref 38–126)
Anion gap: 10 (ref 5–15)
BUN: 26 mg/dL — ABNORMAL HIGH (ref 8–23)
CO2: 22 mmol/L (ref 22–32)
Calcium: 9 mg/dL (ref 8.9–10.3)
Chloride: 101 mmol/L (ref 98–111)
Creatinine: 1.67 mg/dL — ABNORMAL HIGH (ref 0.44–1.00)
GFR, Estimated: 33 mL/min — ABNORMAL LOW (ref >60)
Glucose, Bld: 111 mg/dL — ABNORMAL HIGH (ref 70–99)
Potassium: 3.3 mmol/L — ABNORMAL LOW (ref 3.5–5.1)
Sodium: 133 mmol/L — ABNORMAL LOW (ref 135–145)
Total Bilirubin: 0.4 mg/dL (ref <1.2)
Total Protein: 6.5 g/dL (ref 6.5–8.1)

## 2023-11-20 LAB — CBC WITH DIFFERENTIAL (CANCER CENTER ONLY)
Abs Immature Granulocytes: 0.05 10*3/uL (ref 0.00–0.07)
Basophils Absolute: 0 10*3/uL (ref 0.0–0.1)
Basophils Relative: 0 %
Eosinophils Absolute: 0 10*3/uL (ref 0.0–0.5)
Eosinophils Relative: 0 %
HCT: 24.2 % — ABNORMAL LOW (ref 36.0–46.0)
Hemoglobin: 8.5 g/dL — ABNORMAL LOW (ref 12.0–15.0)
Immature Granulocytes: 1 %
Lymphocytes Relative: 20 %
Lymphs Abs: 1.7 10*3/uL (ref 0.7–4.0)
MCH: 33.6 pg (ref 26.0–34.0)
MCHC: 35.1 g/dL (ref 30.0–36.0)
MCV: 95.7 fL (ref 80.0–100.0)
Monocytes Absolute: 0.8 10*3/uL (ref 0.1–1.0)
Monocytes Relative: 9 %
Neutro Abs: 6 10*3/uL (ref 1.7–7.7)
Neutrophils Relative %: 70 %
Platelet Count: 101 10*3/uL — ABNORMAL LOW (ref 150–400)
RBC: 2.53 MIL/uL — ABNORMAL LOW (ref 3.87–5.11)
RDW: 18.3 % — ABNORMAL HIGH (ref 11.5–15.5)
WBC Count: 8.5 10*3/uL (ref 4.0–10.5)
nRBC: 0 % (ref 0.0–0.2)

## 2023-11-20 LAB — LACTATE DEHYDROGENASE: LDH: 192 U/L (ref 98–192)

## 2023-11-20 MED ORDER — DEXAMETHASONE SODIUM PHOSPHATE 10 MG/ML IJ SOLN
10.0000 mg | Freq: Once | INTRAMUSCULAR | Status: AC
  Administered 2023-11-20: 10 mg via INTRAVENOUS
  Filled 2023-11-20: qty 1

## 2023-11-20 MED ORDER — SODIUM CHLORIDE 0.9 % IV SOLN
150.0000 mg | Freq: Once | INTRAVENOUS | Status: AC
  Administered 2023-11-20: 150 mg via INTRAVENOUS
  Filled 2023-11-20: qty 150

## 2023-11-20 MED ORDER — SODIUM CHLORIDE 0.9 % IV SOLN: Freq: Once | INTRAVENOUS | Status: AC

## 2023-11-20 MED ORDER — DARBEPOETIN ALFA 300 MCG/0.6ML IJ SOSY
300.0000 ug | PREFILLED_SYRINGE | Freq: Once | INTRAMUSCULAR | Status: AC
  Administered 2023-11-20: 300 ug via SUBCUTANEOUS
  Filled 2023-11-20: qty 0.6

## 2023-11-20 MED ORDER — DIPHENHYDRAMINE HCL 25 MG PO CAPS
50.0000 mg | ORAL_CAPSULE | Freq: Once | ORAL | Status: AC
  Administered 2023-11-20: 50 mg via ORAL
  Filled 2023-11-20: qty 2

## 2023-11-20 MED ORDER — ACETAMINOPHEN 325 MG PO TABS
650.0000 mg | ORAL_TABLET | Freq: Once | ORAL | Status: AC
Start: 2023-11-20 — End: 2023-11-20
  Administered 2023-11-20: 650 mg via ORAL
  Filled 2023-11-20: qty 2

## 2023-11-20 MED ORDER — SODIUM CHLORIDE 0.9 % IV SOLN
450.0000 mg | Freq: Once | INTRAVENOUS | Status: AC
  Administered 2023-11-20: 450 mg via INTRAVENOUS
  Filled 2023-11-20: qty 45

## 2023-11-20 MED ORDER — SODIUM CHLORIDE 0.9 % IV SOLN
420.0000 mg | Freq: Once | INTRAVENOUS | Status: AC
  Administered 2023-11-20: 420 mg via INTRAVENOUS
  Filled 2023-11-20: qty 14

## 2023-11-20 MED ORDER — PALONOSETRON HCL INJECTION 0.25 MG/5ML
0.2500 mg | Freq: Once | INTRAVENOUS | Status: AC
  Administered 2023-11-20: 0.25 mg via INTRAVENOUS
  Filled 2023-11-20: qty 5

## 2023-11-20 MED ORDER — SODIUM CHLORIDE 0.9 % IV SOLN
6.0000 mg/kg | Freq: Once | INTRAVENOUS | Status: AC
  Administered 2023-11-20: 525 mg via INTRAVENOUS
  Filled 2023-11-20: qty 25

## 2023-11-20 MED ORDER — DIPHENOXYLATE-ATROPINE 2.5-0.025 MG PO TABS: 2.0000 | ORAL_TABLET | Freq: Four times a day (QID) | ORAL | 0 refills | Status: DC | PRN

## 2023-11-20 MED ORDER — SODIUM CHLORIDE 0.9 % IV SOLN
63.7500 mg/m2 | Freq: Once | INTRAVENOUS | Status: AC
  Administered 2023-11-20: 129 mg via INTRAVENOUS
  Filled 2023-11-20: qty 12.9

## 2023-11-20 NOTE — Patient Instructions (Signed)

## 2023-11-20 NOTE — Progress Notes (Signed)
K. Hematology and Oncology Follow Up Visit  Trichia Hieb [id number] July 01, 1956 67 y.o. 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 Greylyn Burtness is here today for follow-up.  Diarrhea is still an issue for her.  I will go ahead and call in some Lomotil to see if this may help.  Otherwise, she is just is very distressed over the fact that she lost her hair.  I know this is a big "hit" to her overall self image.  I know that the hair will come back.  She has had no problem with mouth sores.  There is been no problems with fever.  She has had no cough or shortness of breath.  She has had no rashes.  There is been no leg swelling.  She has had no headache.  Currently, I would say performance status is probably ECOG 1.   Medications:  Allergies as of 11/20/2023       Reactions   Levaquin [levofloxacin] Nausea And Vomiting, Other (See Comments)   dizziness        Medication List        Accurate as of November 20, 2023  9:25 AM. If you have any questions, ask your nurse or doctor.          albuterol 108 (90 Base) MCG/ACT inhaler Commonly known as: VENTOLIN HFA Inhale 2 puffs into the lungs every 6 (six) hours as needed.   ALPRAZolam 0.25 MG tablet Commonly known as: XANAX Take 1 tablet (0.25 mg total) by mouth 3 (three) times daily as needed for anxiety.   amLODipine 10 MG tablet Commonly known as: NORVASC Take 10 mg by mouth daily.   atorvastatin 10 MG tablet Commonly known as: LIPITOR Take 5 mg by mouth at bedtime.   benazepril 40 MG  tablet Commonly known as: LOTENSIN Take 40 mg by mouth daily.   dexamethasone 4 MG tablet Commonly known as: DECADRON Take 2 tabs by mouth 2 times daily starting day before chemo. Then take 2 tabs daily for 2 days starting day after chemo. Take with food.   doxazosin 2 MG tablet Commonly known as: CARDURA Take 2 mg by mouth 2 (two) times daily.   Eliquis 5 MG Tabs tablet Generic drug: apixaban Take 5 mg by mouth 2 (two) times daily.   fluconazole 200 MG tablet Commonly known as: DIFLUCAN Take 1 tablet (200 mg total) by mouth daily.   gabapentin 600 MG tablet Commonly known as: NEURONTIN Take 600 mg by mouth at bedtime.   hydrALAZINE 50 MG tablet Commonly known as: APRESOLINE Take 50 mg by mouth 2 (two) times daily.   Iron 325 (65 Fe) MG Tabs Take 1 tablet by mouth daily.   lidocaine-prilocaine cream Commonly known as: EMLA Apply to affected area once   loperamide HCl 1 MG/7.5ML suspension Commonly known as: IMODIUM Take 15 mLs (2 mg total) by mouth as needed for diarrhea or loose stools.   magic mouthwash w/lidocaine Soln Swish and spit 5 mls by mouth before meals and at bedtime for mouth sores.   lidocaine-diphenhydrAMINE-alum & mag hydroxide-simeth-nystatin  Swish and spit 5 mLs by mouth 4 (four) times daily -  before meals and at bedtime for mouth sores.   metFORMIN 1000 MG tablet Commonly known as: GLUCOPHAGE Take 1,000 mg by mouth 2 (two) times daily.   metoprolol succinate 100 MG 24 hr tablet Commonly known as: TOPROL-XL Take 100 mg by mouth daily.   metroNIDAZOLE 500 MG tablet Commonly known as: FLAGYL Take 1 tablet (500 mg total) by mouth 3 (three) times daily.   ondansetron 8 MG tablet Commonly known as: Zofran Take 1 tablet (8 mg total) by mouth every 8 (eight) hours as needed for nausea or vomiting. Start on the third day after chemotherapy.   oxyCODONE 5 MG immediate release tablet Commonly known as: Oxy IR/ROXICODONE Take 1 tablet (5 mg  total) by mouth every 6 (six) hours as needed for moderate pain, severe pain or breakthrough pain.   prochlorperazine 10 MG tablet Commonly known as: COMPAZINE Take 1 tablet (10 mg total) by mouth every 6 (six) hours as needed for nausea or vomiting.   triamterene-hydrochlorothiazide 75-50 MG tablet Commonly known as: MAXZIDE Take 1 tablet by mouth daily.   Vitamin D (Ergocalciferol) 1.25 MG (50000 UNIT) Caps capsule Commonly known as: DRISDOL Take 50,000 Units by mouth once a week.        Allergies:  Allergies  Allergen Reactions   Levaquin [Levofloxacin] Nausea And Vomiting and Other (See Comments)    dizziness    Past Medical History, Surgical history, Social history, and Family History were reviewed and updated.  Review of Systems: Review of Systems  Constitutional: Negative.   HENT: Negative.    Eyes: Negative.   Respiratory: Negative.    Cardiovascular: Negative.   Gastrointestinal: Negative.   Genitourinary: Negative.   Musculoskeletal: Negative.   Skin: Negative.   Neurological: Negative.   Endo/Heme/Allergies: Negative.   Psychiatric/Behavioral: Negative.       Physical Exam:  height is 5\' 6"  (1.676 m) and weight is 191 lb (86.6 kg). Her oral temperature is 98.5 F (36.9 C). Her blood pressure is 139/53 (abnormal) and her pulse is 72. Her respiration is 16 and oxygen saturation is 100%.   Wt Readings from Last 3 Encounters:  11/20/23 191 lb (86.6 kg)  10/31/23 194 lb (88 kg)  10/04/23 193 lb (87.5 kg)    Physical Exam Vitals reviewed.  Constitutional:      Comments: Her breast exam shows left breast with no masses, edema or erythema.  There is no left axillary adenopathy.  Right chest wall shows healed mastectomy scar.  There is little bit of hyperpigmentation.  She has had no masses.  There is no erythema.  There is no right axillary adenopathy.  HENT:     Head: Normocephalic and atraumatic.  Eyes:     Pupils: Pupils are equal, round, and  reactive to light.  Cardiovascular:     Rate and Rhythm: Normal rate and regular rhythm.     Heart sounds: Normal heart sounds.  Pulmonary:     Effort: Pulmonary effort is normal.     Breath sounds: Normal breath sounds.  Abdominal:     General: Bowel sounds are normal.     Palpations: Abdomen is soft.  Musculoskeletal:        General: No tenderness or deformity. Normal range of motion.     Cervical back: Normal range of motion.  Lymphadenopathy:     Cervical: No cervical adenopathy.  Skin:    General: Skin is warm and dry.  Findings: No erythema or rash.  Neurological:     Mental Status: She is alert and oriented to person, place, and time.  Psychiatric:        Behavior: Behavior normal.        Thought Content: Thought content normal.        Judgment: Judgment normal.      Lab Results  Component Value Date   WBC 8.5 11/20/2023   HGB 8.5 (L) 11/20/2023   HCT 24.2 (L) 11/20/2023   MCV 95.7 11/20/2023   PLT 101 (L) 11/20/2023   Lab Results  Component Value Date   FERRITIN 178 10/31/2023   IRON 57 10/31/2023   TIBC 343 10/31/2023   UIBC 286 10/31/2023   IRONPCTSAT 17 10/31/2023   Lab Results  Component Value Date   RBC 2.53 (L) 11/20/2023   No results found for: "KPAFRELGTCHN", "LAMBDASER", "KAPLAMBRATIO" No results found for: "IGGSERUM", "IGA", "IGMSERUM" No results found for: "TOTALPROTELP", "ALBUMINELP", "A1GS", "A2GS", "BETS", "BETA2SER", "GAMS", "MSPIKE", "SPEI"   Chemistry      Component Value Date/Time   NA 133 (L) 11/20/2023 0815   K 3.3 (L) 11/20/2023 0815   CL 101 11/20/2023 0815   CO2 22 11/20/2023 0815   BUN 26 (H) 11/20/2023 0815   CREATININE 1.67 (H) 11/20/2023 0815      Component Value Date/Time   CALCIUM 9.0 11/20/2023 0815   ALKPHOS 73 11/20/2023 0815   AST 18 11/20/2023 0815   ALT 11 11/20/2023 0815   BILITOT 0.4 11/20/2023 0815       Impression and Plan: Ms. Samuella Cota is a pleasant 67 yo African American female with invasive ductal  carcinoma of the right breast, ER-/PR-/HER2 + treated with mastectomy.   She had 1 positive lymph node.  Unfortunately, she recurred quickly after anti-HER2 therapy.  We now have her on chemotherapy along with anti-HER2 therapy.  She has done pretty well.  Again we will have to watch the diarrhea.  Hopefully, the Lomotil will help her.  She is quite anemic.  Her it was low folate level is only 10.7.  As such, we will go ahead and give her some Aranesp.  Hopefully this will help with her anemia.    We have 2 more cycles of treatment after this.  We will get her back in 3 weeks.    Josph Macho, MD 11/25/20249:25 AM

## 2023-11-20 NOTE — Patient Instructions (Signed)
[facility]  Discharge Instructions: Thank you for choosing [facility] to provide your oncology and hematology care.   If you have a lab appointment with the Cancer Center, please go directly to the Cancer Center and check in at the registration area.  Wear comfortable clothing and clothing appropriate for easy access to any Portacath or PICC line.   We strive to give you quality time with your provider. You may need to reschedule your appointment if you arrive late (15 or more minutes).  Arriving late affects you and other patients whose appointments are after yours.  Also, if you miss three or more appointments without notifying the office, you may be dismissed from the clinic at the provider's discretion.      For prescription refill requests, have your pharmacy contact our office and allow 72 hours for refills to be completed.    Today you received the following chemotherapy and/or immunotherapy agents Taxotere, Carbo      To help prevent nausea and vomiting after your treatment, we encourage you to take your nausea medication as directed.  BELOW ARE SYMPTOMS THAT SHOULD BE REPORTED IMMEDIATELY: *FEVER GREATER THAN 100.4 F (38 C) OR HIGHER *CHILLS OR SWEATING *NAUSEA AND VOMITING THAT IS NOT CONTROLLED WITH YOUR NAUSEA MEDICATION *UNUSUAL SHORTNESS OF BREATH *UNUSUAL BRUISING OR BLEEDING *URINARY PROBLEMS (pain or burning when urinating, or frequent urination) *BOWEL PROBLEMS (unusual diarrhea, constipation, pain near the anus) TENDERNESS IN MOUTH AND THROAT WITH OR WITHOUT PRESENCE OF ULCERS (sore throat, sores in mouth, or a toothache) UNUSUAL RASH, SWELLING OR PAIN  UNUSUAL VAGINAL DISCHARGE OR ITCHING   Items with * indicate a potential emergency and should be followed up as soon as possible or go to the Emergency Department if any problems should occur.  Please show the CHEMOTHERAPY ALERT CARD or  IMMUNOTHERAPY ALERT CARD at check-in to the Emergency Department and triage nurse. Should you have questions after your visit or need to cancel or reschedule your appointment, please contact [facility] - A DEPT OF [facility]  [phone] and follow the prompts.  Office hours are 8:00 a.m. to 4:30 p.m. Monday - Friday. Please note that voicemails left after 4:00 p.m. may not be returned until the following business day.  We are closed weekends and major holidays. You have access to a nurse at all times for urgent questions. Please call the main number to the clinic [phone] and follow the prompts.  For any non-urgent questions, you may also contact your provider using MyChart. We now offer e-Visits for anyone 46 and older to request care online for non-urgent symptoms. For details visit [email or web address].   Also download the MyChart app! Go to the app store, search "MyChart", open the app, select [place], and log in with your MyChart username and password.

## 2023-11-20 NOTE — Progress Notes (Signed)
Per Dr. Myna Hidalgo, okay to treat today with creatinine of 1.6 and will give extra 500 mls of fluid today.

## 2023-11-21 ENCOUNTER — Other Ambulatory Visit: Payer: Self-pay

## 2023-11-21 ENCOUNTER — Encounter: Payer: Self-pay | Admitting: *Deleted

## 2023-11-21 LAB — CANCER ANTIGEN 27.29: CA 27.29: 41.6 U/mL — ABNORMAL HIGH (ref 0.0–38.6)

## 2023-11-21 NOTE — Progress Notes (Signed)
Patient received cycle four. Continue as ordered.   Oncology Nurse Navigator Documentation     11/21/2023    8:00 AM  Oncology Nurse Navigator Flowsheets  Navigator Follow Up Date: 12/11/2023  Navigator Follow Up Reason: Follow-up Appointment;Chemotherapy  Navigator Location [facility]  Navigator Encounter Type Appt/Treatment Plan Review  Patient Visit Type MedOnc  Treatment Phase Active Tx  Barriers/Navigation Needs Coordination of Care;Education  Interventions None Required  Acuity Level 2-Minimal Needs (1-2 Barriers Identified)  Support Groups/Services Friends and Family  Time Spent with Patient 15

## 2023-11-22 ENCOUNTER — Inpatient Hospital Stay: Payer: Medicare Other

## 2023-11-22 VITALS — BP 144/48 | HR 78 | Temp 98.0°F | Resp 16

## 2023-11-22 DIAGNOSIS — Z5112 Encounter for antineoplastic immunotherapy: Secondary | ICD-10-CM | POA: Diagnosis not present

## 2023-11-22 DIAGNOSIS — C50911 Malignant neoplasm of unspecified site of right female breast: Secondary | ICD-10-CM

## 2023-11-22 MED ORDER — PEGFILGRASTIM INJECTION 6 MG/0.6ML ~~LOC~~
6.0000 mg | PREFILLED_SYRINGE | Freq: Once | SUBCUTANEOUS | Status: AC
  Administered 2023-11-22: 6 mg via SUBCUTANEOUS
  Filled 2023-11-22: qty 0.6

## 2023-11-22 NOTE — Patient Instructions (Signed)

## 2023-11-27 ENCOUNTER — Ambulatory Visit: Payer: Medicare Other | Admitting: Hematology & Oncology

## 2023-11-27 ENCOUNTER — Telehealth: Payer: Self-pay

## 2023-11-27 ENCOUNTER — Other Ambulatory Visit: Payer: Medicare Other

## 2023-11-27 ENCOUNTER — Inpatient Hospital Stay: Payer: Medicare Other

## 2023-11-27 ENCOUNTER — Other Ambulatory Visit: Payer: Self-pay

## 2023-11-27 ENCOUNTER — Ambulatory Visit: Payer: Medicare Other

## 2023-11-27 DIAGNOSIS — C50911 Malignant neoplasm of unspecified site of right female breast: Secondary | ICD-10-CM

## 2023-11-27 NOTE — Telephone Encounter (Signed)
Received phone call from patient stating that she has been dizzy, weak and having diarrhea for the past 3 days.  Pt states she is dizzy when she is up and walking but denies when she is laying down. Pt states she has severe fatigue and only getting up to eat or use the bathroom. Pt states that after she eats she is having diarrhea despite using lomitil as prescribed. Pt states she is having diarrhea 3 times daily. Pt denies any other complaints. Pt states she is unable to come to clinic today for fluids and labs but will be able to come tomorrow. Dr. Myna Hidalgo aware of patient symptoms and ok with patient being coming tomorrow. Lab orders placed and fluid orders placed in sign and held.   Pt educated that if her dizziness or weakness worsens or she is unable to stand or walk to seek care from the nearest emergency department.  Pt verbalized understanding and had no further questions.

## 2023-11-28 ENCOUNTER — Inpatient Hospital Stay: Payer: Medicare Other

## 2023-11-28 ENCOUNTER — Inpatient Hospital Stay: Payer: Medicare Other | Attending: Hematology & Oncology

## 2023-11-28 VITALS — BP 94/37 | HR 69 | Temp 97.9°F | Resp 17

## 2023-11-28 VITALS — BP 112/55 | HR 78

## 2023-11-28 DIAGNOSIS — E860 Dehydration: Secondary | ICD-10-CM

## 2023-11-28 DIAGNOSIS — R110 Nausea: Secondary | ICD-10-CM

## 2023-11-28 DIAGNOSIS — T451X5D Adverse effect of antineoplastic and immunosuppressive drugs, subsequent encounter: Secondary | ICD-10-CM | POA: Insufficient documentation

## 2023-11-28 DIAGNOSIS — D6481 Anemia due to antineoplastic chemotherapy: Secondary | ICD-10-CM | POA: Diagnosis not present

## 2023-11-28 DIAGNOSIS — Z171 Estrogen receptor negative status [ER-]: Secondary | ICD-10-CM | POA: Insufficient documentation

## 2023-11-28 DIAGNOSIS — C773 Secondary and unspecified malignant neoplasm of axilla and upper limb lymph nodes: Secondary | ICD-10-CM | POA: Insufficient documentation

## 2023-11-28 DIAGNOSIS — Z7984 Long term (current) use of oral hypoglycemic drugs: Secondary | ICD-10-CM | POA: Diagnosis not present

## 2023-11-28 DIAGNOSIS — R197 Diarrhea, unspecified: Secondary | ICD-10-CM | POA: Insufficient documentation

## 2023-11-28 DIAGNOSIS — Z9011 Acquired absence of right breast and nipple: Secondary | ICD-10-CM | POA: Insufficient documentation

## 2023-11-28 DIAGNOSIS — C50911 Malignant neoplasm of unspecified site of right female breast: Secondary | ICD-10-CM | POA: Diagnosis present

## 2023-11-28 LAB — CMP (CANCER CENTER ONLY)
ALT: 21 U/L (ref 0–44)
AST: 20 U/L (ref 15–41)
Albumin: 3.8 g/dL (ref 3.5–5.0)
Alkaline Phosphatase: 75 U/L (ref 38–126)
Anion gap: 10 (ref 5–15)
BUN: 45 mg/dL — ABNORMAL HIGH (ref 8–23)
CO2: 18 mmol/L — ABNORMAL LOW (ref 22–32)
Calcium: 9.4 mg/dL (ref 8.9–10.3)
Chloride: 102 mmol/L (ref 98–111)
Creatinine: 3.73 mg/dL — ABNORMAL HIGH (ref 0.44–1.00)
GFR, Estimated: 13 mL/min — ABNORMAL LOW (ref >60)
Glucose, Bld: 134 mg/dL — ABNORMAL HIGH (ref 70–99)
Potassium: 3.3 mmol/L — ABNORMAL LOW (ref 3.5–5.1)
Sodium: 130 mmol/L — ABNORMAL LOW (ref 135–145)
Total Bilirubin: 0.3 mg/dL (ref <1.2)
Total Protein: 7.1 g/dL (ref 6.5–8.1)

## 2023-11-28 LAB — CBC WITH DIFFERENTIAL (CANCER CENTER ONLY)
Abs Immature Granulocytes: 0.11 10*3/uL — ABNORMAL HIGH (ref 0.00–0.07)
Basophils Absolute: 0.1 10*3/uL (ref 0.0–0.1)
Basophils Relative: 1 %
Eosinophils Absolute: 0 10*3/uL (ref 0.0–0.5)
Eosinophils Relative: 0 %
HCT: 25.4 % — ABNORMAL LOW (ref 36.0–46.0)
Hemoglobin: 8.9 g/dL — ABNORMAL LOW (ref 12.0–15.0)
Immature Granulocytes: 1 %
Lymphocytes Relative: 16 %
Lymphs Abs: 1.4 10*3/uL (ref 0.7–4.0)
MCH: 34.1 pg — ABNORMAL HIGH (ref 26.0–34.0)
MCHC: 35 g/dL (ref 30.0–36.0)
MCV: 97.3 fL (ref 80.0–100.0)
Monocytes Absolute: 1.9 10*3/uL — ABNORMAL HIGH (ref 0.1–1.0)
Monocytes Relative: 22 %
Neutro Abs: 5.3 10*3/uL (ref 1.7–7.7)
Neutrophils Relative %: 60 %
Platelet Count: 96 10*3/uL — ABNORMAL LOW (ref 150–400)
RBC: 2.61 MIL/uL — ABNORMAL LOW (ref 3.87–5.11)
RDW: 16.4 % — ABNORMAL HIGH (ref 11.5–15.5)
Smear Review: NORMAL
WBC Count: 8.8 10*3/uL (ref 4.0–10.5)
nRBC: 0 % (ref 0.0–0.2)

## 2023-11-28 LAB — SAMPLE TO BLOOD BANK

## 2023-11-28 MED ORDER — SODIUM CHLORIDE 0.9 % IV SOLN: INTRAVENOUS | Status: DC

## 2023-11-28 MED ORDER — ONDANSETRON HCL 4 MG/2ML IJ SOLN
4.0000 mg | Freq: Once | INTRAMUSCULAR | Status: AC
Start: 2023-11-28 — End: 2023-11-28
  Administered 2023-11-28: 4 mg via INTRAVENOUS
  Filled 2023-11-28: qty 2

## 2023-11-29 ENCOUNTER — Ambulatory Visit: Payer: Medicare Other

## 2023-12-01 ENCOUNTER — Encounter: Payer: Self-pay | Admitting: *Deleted

## 2023-12-07 ENCOUNTER — Telehealth: Payer: Self-pay

## 2023-12-07 NOTE — Telephone Encounter (Signed)
Received phone call from patient stating that she was discharged from the hospital in [place] today. Pt stated she was admitted b/c the amount of diarrhea she was having post chemo threw off her electrolytes and she was experiencing low blood pressure and "seizure like activity" Pt states she does not want treatment next week and would like to give her body time to heal post hospitalization. Pt states she would like to have labs and see Dr. Myna Hidalgo to discuss treatment options as she hasn't handled this chemotherapy very well. Pt requesting to hold off until after the New Year. Dr. Myna Hidalgo aware and agreeable to plan. Scheduling message sent. Pt aware scheduling will call and schedule patient. Pt had no further questions or concerns.

## 2023-12-09 ENCOUNTER — Other Ambulatory Visit: Payer: Self-pay

## 2023-12-11 ENCOUNTER — Other Ambulatory Visit: Payer: Medicare Other

## 2023-12-11 ENCOUNTER — Encounter: Payer: Self-pay | Admitting: *Deleted

## 2023-12-11 ENCOUNTER — Ambulatory Visit: Payer: Medicare Other

## 2023-12-11 ENCOUNTER — Ambulatory Visit: Payer: Medicare Other | Admitting: Medical Oncology

## 2023-12-11 NOTE — Progress Notes (Signed)
Patient was scheduled to be seen today, however she called last week with reports of hospitalization due to poor tolerance of current regimen. She wants to speak to Dr Myna Hidalgo, however would like a break over the holidays.   Oncology Nurse Navigator Documentation     12/11/2023   10:15 AM  Oncology Nurse Navigator Flowsheets  Navigator Follow Up Date: 01/04/2024  Navigator Follow Up Reason: Follow-up Appointment  Navigator Location [facility]  Navigator Encounter Type Appt/Treatment Plan Review  Patient Visit Type MedOnc  Treatment Phase Active Tx  Barriers/Navigation Needs Coordination of Care;Education  Interventions None Required  Acuity Level 2-Minimal Needs (1-2 Barriers Identified)  Support Groups/Services Friends and Family  Time Spent with Patient 15

## 2023-12-13 ENCOUNTER — Ambulatory Visit: Payer: Medicare Other

## 2023-12-25 ENCOUNTER — Other Ambulatory Visit: Payer: Self-pay | Admitting: *Deleted

## 2023-12-25 DIAGNOSIS — C50911 Malignant neoplasm of unspecified site of right female breast: Secondary | ICD-10-CM

## 2023-12-26 ENCOUNTER — Encounter: Payer: Self-pay | Admitting: Hematology & Oncology

## 2023-12-26 MED ORDER — ALPRAZOLAM 0.25 MG PO TABS: 0.2500 mg | ORAL_TABLET | Freq: Three times a day (TID) | ORAL | 0 refills | Status: DC | PRN

## 2024-01-01 ENCOUNTER — Other Ambulatory Visit: Payer: Self-pay

## 2024-01-04 ENCOUNTER — Other Ambulatory Visit: Payer: Self-pay

## 2024-01-04 ENCOUNTER — Inpatient Hospital Stay (HOSPITAL_BASED_OUTPATIENT_CLINIC_OR_DEPARTMENT_OTHER): Payer: Medicare Other | Admitting: Hematology & Oncology

## 2024-01-04 ENCOUNTER — Encounter: Payer: Self-pay | Admitting: *Deleted

## 2024-01-04 ENCOUNTER — Encounter: Payer: Self-pay | Admitting: Hematology & Oncology

## 2024-01-04 ENCOUNTER — Inpatient Hospital Stay: Payer: Medicare Other | Attending: Hematology & Oncology

## 2024-01-04 ENCOUNTER — Other Ambulatory Visit: Payer: Self-pay | Admitting: Family

## 2024-01-04 ENCOUNTER — Inpatient Hospital Stay: Payer: Medicare Other

## 2024-01-04 VITALS — BP 136/69 | HR 73 | Temp 97.5°F | Resp 17 | Ht 66.0 in | Wt 184.1 lb

## 2024-01-04 DIAGNOSIS — Z95828 Presence of other vascular implants and grafts: Secondary | ICD-10-CM

## 2024-01-04 DIAGNOSIS — C50911 Malignant neoplasm of unspecified site of right female breast: Secondary | ICD-10-CM | POA: Insufficient documentation

## 2024-01-04 DIAGNOSIS — D6481 Anemia due to antineoplastic chemotherapy: Secondary | ICD-10-CM | POA: Diagnosis present

## 2024-01-04 DIAGNOSIS — R110 Nausea: Secondary | ICD-10-CM

## 2024-01-04 DIAGNOSIS — Z171 Estrogen receptor negative status [ER-]: Secondary | ICD-10-CM | POA: Insufficient documentation

## 2024-01-04 DIAGNOSIS — R197 Diarrhea, unspecified: Secondary | ICD-10-CM | POA: Insufficient documentation

## 2024-01-04 DIAGNOSIS — Z9011 Acquired absence of right breast and nipple: Secondary | ICD-10-CM | POA: Insufficient documentation

## 2024-01-04 DIAGNOSIS — T451X5D Adverse effect of antineoplastic and immunosuppressive drugs, subsequent encounter: Secondary | ICD-10-CM | POA: Diagnosis not present

## 2024-01-04 DIAGNOSIS — D649 Anemia, unspecified: Secondary | ICD-10-CM

## 2024-01-04 DIAGNOSIS — C773 Secondary and unspecified malignant neoplasm of axilla and upper limb lymph nodes: Secondary | ICD-10-CM | POA: Diagnosis not present

## 2024-01-04 DIAGNOSIS — E860 Dehydration: Secondary | ICD-10-CM

## 2024-01-04 DIAGNOSIS — D631 Anemia in chronic kidney disease: Secondary | ICD-10-CM | POA: Diagnosis not present

## 2024-01-04 DIAGNOSIS — E871 Hypo-osmolality and hyponatremia: Secondary | ICD-10-CM

## 2024-01-04 DIAGNOSIS — A09 Infectious gastroenteritis and colitis, unspecified: Secondary | ICD-10-CM

## 2024-01-04 DIAGNOSIS — Z7189 Other specified counseling: Secondary | ICD-10-CM

## 2024-01-04 LAB — CMP (CANCER CENTER ONLY)
ALT: 10 U/L (ref 0–44)
AST: 16 U/L (ref 15–41)
Albumin: 3.8 g/dL (ref 3.5–5.0)
Alkaline Phosphatase: 52 U/L (ref 38–126)
Anion gap: 7 (ref 5–15)
BUN: 26 mg/dL — ABNORMAL HIGH (ref 8–23)
CO2: 25 mmol/L (ref 22–32)
Calcium: 9.3 mg/dL (ref 8.9–10.3)
Chloride: 94 mmol/L — ABNORMAL LOW (ref 98–111)
Creatinine: 1.64 mg/dL — ABNORMAL HIGH (ref 0.44–1.00)
GFR, Estimated: 33 mL/min — ABNORMAL LOW (ref >60)
Glucose, Bld: 97 mg/dL (ref 70–99)
Potassium: 4.4 mmol/L (ref 3.5–5.1)
Sodium: 126 mmol/L — ABNORMAL LOW (ref 135–145)
Total Bilirubin: 0.4 mg/dL (ref 0.0–1.2)
Total Protein: 6.4 g/dL — ABNORMAL LOW (ref 6.5–8.1)

## 2024-01-04 LAB — CBC WITH DIFFERENTIAL (CANCER CENTER ONLY)
Abs Immature Granulocytes: 0.01 10*3/uL (ref 0.00–0.07)
Basophils Absolute: 0 10*3/uL (ref 0.0–0.1)
Basophils Relative: 0 %
Eosinophils Absolute: 0 10*3/uL (ref 0.0–0.5)
Eosinophils Relative: 1 %
HCT: 22.9 % — ABNORMAL LOW (ref 36.0–46.0)
Hemoglobin: 8 g/dL — ABNORMAL LOW (ref 12.0–15.0)
Immature Granulocytes: 0 %
Lymphocytes Relative: 32 %
Lymphs Abs: 1.7 10*3/uL (ref 0.7–4.0)
MCH: 34.8 pg — ABNORMAL HIGH (ref 26.0–34.0)
MCHC: 34.9 g/dL (ref 30.0–36.0)
MCV: 99.6 fL (ref 80.0–100.0)
Monocytes Absolute: 0.5 10*3/uL (ref 0.1–1.0)
Monocytes Relative: 9 %
Neutro Abs: 3.1 10*3/uL (ref 1.7–7.7)
Neutrophils Relative %: 58 %
Platelet Count: 99 10*3/uL — ABNORMAL LOW (ref 150–400)
RBC: 2.3 MIL/uL — ABNORMAL LOW (ref 3.87–5.11)
RDW: 13.7 % (ref 11.5–15.5)
WBC Count: 5.3 10*3/uL (ref 4.0–10.5)
nRBC: 0 % (ref 0.0–0.2)

## 2024-01-04 LAB — SAMPLE TO BLOOD BANK

## 2024-01-04 LAB — RETICULOCYTES
Immature Retic Fract: 1.4 % — ABNORMAL LOW (ref 2.3–15.9)
RBC.: 2.27 MIL/uL — ABNORMAL LOW (ref 3.87–5.11)
Retic Count, Absolute: 33.8 10*3/uL (ref 19.0–186.0)
Retic Ct Pct: 1.5 % (ref 0.4–3.1)

## 2024-01-04 LAB — ABO/RH: ABO/RH(D): A POS

## 2024-01-04 LAB — FERRITIN: Ferritin: 209 ng/mL (ref 11–307)

## 2024-01-04 LAB — IRON AND IRON BINDING CAPACITY (CC-WL,HP ONLY)
Iron: 95 ug/dL (ref 28–170)
Saturation Ratios: 29 % (ref 10.4–31.8)
TIBC: 333 ug/dL (ref 250–450)
UIBC: 238 ug/dL (ref 148–442)

## 2024-01-04 LAB — PREPARE RBC (CROSSMATCH)

## 2024-01-04 MED ORDER — HEPARIN SOD (PORK) LOCK FLUSH 100 UNIT/ML IV SOLN: 500.0000 [IU] | Freq: Once | INTRAVENOUS | Status: DC

## 2024-01-04 MED ORDER — HEPARIN SOD (PORK) LOCK FLUSH 100 UNIT/ML IV SOLN
500.0000 [IU] | Freq: Once | INTRAVENOUS | Status: AC
  Administered 2024-01-04: 500 [IU] via INTRAVENOUS

## 2024-01-04 MED ORDER — SODIUM CHLORIDE 0.9% FLUSH: 10.0000 mL | INTRAVENOUS | Status: DC | PRN

## 2024-01-04 MED ORDER — SODIUM CHLORIDE 0.9% FLUSH
10.0000 mL | INTRAVENOUS | Status: DC | PRN
  Administered 2024-01-04: 10 mL via INTRAVENOUS

## 2024-01-04 NOTE — Addendum Note (Signed)
 Addended by: Margaretha Seeds on: 01/04/2024 01:21 PM   Modules accepted: Orders

## 2024-01-04 NOTE — Progress Notes (Signed)
 Patient has had significant toxicities from her treatment, and after four cycles it is decided to stop treatment. She will come back tomorrow for one unit of blood. She will then follow up in about one month for consideration of alternative tx vs observation.   Oncology Nurse Navigator Documentation     01/04/2024   12:00 PM  Oncology Nurse Navigator Flowsheets  Navigator Follow Up Date: 01/31/2024  Navigator Follow Up Reason: Follow-up Appointment  Navigator Location [facility]  Navigator Encounter Type Follow-up Appt;Treatment  Patient Visit Type MedOnc  Treatment Phase Active Tx  Barriers/Navigation Needs Coordination of Care;Education  Interventions Psycho-Social Support  Acuity Level 2-Minimal Needs (1-2 Barriers Identified)  Support Groups/Services Friends and Family  Time Spent with Patient 15

## 2024-01-04 NOTE — Progress Notes (Signed)
 K. Hematology and Oncology Follow Up Visit  Lanora Reveron [id number] 1956/05/17 68 y.o. 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 Lesieli Bresee is here today for follow-up.  Unfortunately, I think it is apparent that she is not going to tolerate this protocol.  After the fourth cycle, she did have in the ICU at a local hospital.  She was hypotensive.  I am not sure if she was septic or neutropenic.  Regardless, I do still think that she is going to be able to tolerate the side effects.  We tried to treat the diarrhea that she gets and this has not been successful.  She clearly is going need to have a transfusion.  Hemoglobin is only 8 today.  We will also have to make sure she gets Aranesp .  Again, she has had 4 cycles of chemotherapy.  I think this is okay given that she had prior treatment with the Kadcyla .  I just hate the fact that she had such a horrible time with treatment.  Her appetite is doing better right now.  She is eating better.  She still has a poor taste for food..  She has had no cough.  There is been no shortness of breath.  She has had no bleeding.  She has had no rashes.  Overall, I would say that her performance status for now is ECOG 1.    Medications:  Allergies as of 01/04/2024       Reactions   Levaquin [levofloxacin] Nausea And Vomiting, Other (See Comments)   dizziness        Medication List        Accurate as of January 04, 2024  5:01 PM. If you have any questions, ask your nurse or doctor.          STOP  taking these medications    dexamethasone  4 MG tablet Commonly known as: DECADRON  Stopped by: Welma Mccombs R Julyana Woolverton   lidocaine -prilocaine  cream Commonly known as: EMLA  Stopped by: Maude JONELLE Crease   metroNIDAZOLE  500 MG tablet Commonly known as: FLAGYL  Stopped by: Maude JONELLE Crease   ondansetron  8 MG tablet Commonly known as: Zofran  Stopped by: Belem Hintze R Braidon Chermak   prochlorperazine  10 MG tablet Commonly known as: COMPAZINE  Stopped by: Maude JONELLE Crease       TAKE these medications    albuterol  108 (90 Base) MCG/ACT inhaler Commonly known as: VENTOLIN  HFA Inhale 2 puffs into the lungs every 6 (six) hours as needed.   ALPRAZolam  0.25 MG tablet Commonly known as: XANAX  Take 1 tablet (0.25 mg total) by mouth 3 (three) times daily as needed for anxiety.   amLODipine 10 MG tablet Commonly known as: NORVASC Take 10 mg by mouth daily.   atorvastatin  10 MG tablet Commonly known as: LIPITOR Take 5 mg by mouth at bedtime.   benazepril  40 MG tablet Commonly known as: LOTENSIN  Take 40 mg by mouth daily.   diphenoxylate -atropine  2.5-0.025 MG tablet Commonly known as: LOMOTIL  Take 2 tablets by mouth 4 (four) times daily as needed for diarrhea or loose stools.  doxazosin 2 MG tablet Commonly known as: CARDURA Take 2 mg by mouth 2 (two) times daily.   Eliquis 5 MG Tabs tablet Generic drug: apixaban Take 5 mg by mouth 2 (two) times daily.   fluconazole  200 MG tablet Commonly known as: DIFLUCAN  Take 1 tablet (200 mg total) by mouth daily.   gabapentin  600 MG tablet Commonly known as: NEURONTIN  Take 600 mg by mouth at bedtime.   hydrALAZINE  50 MG tablet Commonly known as: APRESOLINE  Take 50 mg by mouth 2 (two) times daily.   Iron 325 (65 Fe) MG Tabs Take 1 tablet by mouth daily.   loperamide  HCl 1 MG/7.5ML suspension Commonly known as: IMODIUM  Take 15 mLs (2 mg total) by mouth as needed for diarrhea or loose stools.   magic mouthwash w/lidocaine  Soln Swish and spit 5  mls by mouth before meals and at bedtime for mouth sores.   lidocaine -diphenhydrAMINE -alum & mag hydroxide-simeth-nystatin  Swish and spit 5 mLs by mouth 4 (four) times daily -  before meals and at bedtime for mouth sores.   magnesium oxide 400 (240 Mg) MG tablet Commonly known as: MAG-OX Take 400 mg by mouth 2 (two) times daily.   magnesium oxide 400 MG tablet Commonly known as: MAG-OX Take 1 tablet by mouth 2 (two) times daily.   metFORMIN 1000 MG tablet Commonly known as: GLUCOPHAGE Take 1,000 mg by mouth 2 (two) times daily.   metoprolol  succinate 100 MG 24 hr tablet Commonly known as: TOPROL -XL Take 100 mg by mouth daily.   oxyCODONE  5 MG immediate release tablet Commonly known as: Oxy IR/ROXICODONE  Take 1 tablet (5 mg total) by mouth every 6 (six) hours as needed for moderate pain, severe pain or breakthrough pain.   potassium chloride  SA 20 MEQ tablet Commonly known as: KLOR-CON  M Take 20 mEq by mouth daily.   triamterene -hydrochlorothiazide  75-50 MG tablet Commonly known as: MAXZIDE  Take 1 tablet by mouth daily.   Vitamin D (Ergocalciferol) 1.25 MG (50000 UNIT) Caps capsule Commonly known as: DRISDOL Take 50,000 Units by mouth once a week.        Allergies:  Allergies  Allergen Reactions   Levaquin [Levofloxacin] Nausea And Vomiting and Other (See Comments)    dizziness    Past Medical History, Surgical history, Social history, and Family History were reviewed and updated.  Review of Systems: Review of Systems  Constitutional: Negative.   HENT: Negative.    Eyes: Negative.   Respiratory: Negative.    Cardiovascular: Negative.   Gastrointestinal: Negative.   Genitourinary: Negative.   Musculoskeletal: Negative.   Skin: Negative.   Neurological: Negative.   Endo/Heme/Allergies: Negative.   Psychiatric/Behavioral: Negative.       Physical Exam:  height is 5' 6 (1.676 m) and weight is 184 lb 1.9 oz (83.5 kg). Her oral temperature is 97.5 F  (36.4 C) (abnormal). Her blood pressure is 136/69 and her pulse is 73. Her respiration is 17 and oxygen saturation is 99%.   Wt Readings from Last 3 Encounters:  01/04/24 184 lb 1.9 oz (83.5 kg)  11/20/23 191 lb (86.6 kg)  10/31/23 194 lb (88 kg)    Physical Exam Vitals reviewed.  Constitutional:      Comments: Her breast exam shows left breast with no masses, edema or erythema.  There is no left axillary adenopathy.  Right chest wall shows healed mastectomy scar.  There is little bit of hyperpigmentation.  She has had no masses.  There is no erythema.  There is no right  axillary adenopathy.  HENT:     Head: Normocephalic and atraumatic.  Eyes:     Pupils: Pupils are equal, round, and reactive to light.  Cardiovascular:     Rate and Rhythm: Normal rate and regular rhythm.     Heart sounds: Normal heart sounds.  Pulmonary:     Effort: Pulmonary effort is normal.     Breath sounds: Normal breath sounds.  Abdominal:     General: Bowel sounds are normal.     Palpations: Abdomen is soft.  Musculoskeletal:        General: No tenderness or deformity. Normal range of motion.     Cervical back: Normal range of motion.  Lymphadenopathy:     Cervical: No cervical adenopathy.  Skin:    General: Skin is warm and dry.     Findings: No erythema or rash.  Neurological:     Mental Status: She is alert and oriented to person, place, and time.  Psychiatric:        Behavior: Behavior normal.        Thought Content: Thought content normal.        Judgment: Judgment normal.      Lab Results  Component Value Date   WBC 5.3 01/04/2024   HGB 8.0 (L) 01/04/2024   HCT 22.9 (L) 01/04/2024   MCV 99.6 01/04/2024   PLT 99 (L) 01/04/2024   Lab Results  Component Value Date   FERRITIN 178 10/31/2023   IRON 95 01/04/2024   TIBC 333 01/04/2024   UIBC 238 01/04/2024   IRONPCTSAT 29 01/04/2024   Lab Results  Component Value Date   RETICCTPCT 1.5 01/04/2024   RBC 2.30 (L) 01/04/2024   RBC  2.27 (L) 01/04/2024   No results found for: KPAFRELGTCHN, LAMBDASER, KAPLAMBRATIO No results found for: IGGSERUM, IGA, IGMSERUM No results found for: STEPHANY CARLOTA BENSON MARKEL EARLA JOANNIE, GAMS, MSPIKE, SPEI   Chemistry      Component Value Date/Time   NA 126 (L) 01/04/2024 1133   K 4.4 01/04/2024 1133   CL 94 (L) 01/04/2024 1133   CO2 25 01/04/2024 1133   BUN 26 (H) 01/04/2024 1133   CREATININE 1.64 (H) 01/04/2024 1133      Component Value Date/Time   CALCIUM  9.3 01/04/2024 1133   ALKPHOS 52 01/04/2024 1133   AST 16 01/04/2024 1133   ALT 10 01/04/2024 1133   BILITOT 0.4 01/04/2024 1133       Impression and Plan: Ms. Hedberg is a pleasant 68 yo African American female with invasive ductal carcinoma of the right breast, ER-/PR-/HER2 + treated with mastectomy.   She had 1 positive lymph node.  Unfortunately, she recurred quickly after anti-HER2 therapy.  She has had 4 cycles of chemotherapy with anti-HER2 therapy.  Again, she has had a horrible time.  We do not have to transfuse her.  Will have her come in tomorrow for a transfusion.  We will also give her Aranesp .  Again, we will just have to let her recover from the past chemotherapy.  Is been about 6 weeks and she had treatment.  I will have her come back to see me in 1 month.  I know that she does have fairly aggressive disease since she recurred so quickly after Kadcyla .  1 option might be to think about Enhertu.  Again, we will get her back in 1 month and we will see how she is doing.    Maude JONELLE Crease, MD 1/9/20255:01 PM

## 2024-01-05 ENCOUNTER — Encounter: Payer: Self-pay | Admitting: Hematology & Oncology

## 2024-01-05 ENCOUNTER — Inpatient Hospital Stay: Payer: Medicare Other

## 2024-01-05 ENCOUNTER — Other Ambulatory Visit: Payer: Self-pay | Admitting: *Deleted

## 2024-01-05 VITALS — BP 133/47 | HR 82 | Temp 98.2°F | Resp 16

## 2024-01-05 DIAGNOSIS — D649 Anemia, unspecified: Secondary | ICD-10-CM

## 2024-01-05 DIAGNOSIS — C50911 Malignant neoplasm of unspecified site of right female breast: Secondary | ICD-10-CM

## 2024-01-05 DIAGNOSIS — D6481 Anemia due to antineoplastic chemotherapy: Secondary | ICD-10-CM | POA: Diagnosis not present

## 2024-01-05 LAB — CANCER ANTIGEN 27.29: CA 27.29: 36.2 U/mL (ref 0.0–38.6)

## 2024-01-05 MED ORDER — DARBEPOETIN ALFA 300 MCG/0.6ML IJ SOSY
300.0000 ug | PREFILLED_SYRINGE | Freq: Once | INTRAMUSCULAR | Status: AC
  Administered 2024-01-05: 300 ug via SUBCUTANEOUS
  Filled 2024-01-05: qty 0.6

## 2024-01-05 MED ORDER — SODIUM CHLORIDE 0.9% IV SOLUTION
250.0000 mL | INTRAVENOUS | Status: DC
  Administered 2024-01-05: 250 mL via INTRAVENOUS

## 2024-01-05 MED ORDER — ACETAMINOPHEN 325 MG PO TABS
650.0000 mg | ORAL_TABLET | Freq: Once | ORAL | Status: AC
  Administered 2024-01-05: 650 mg via ORAL
  Filled 2024-01-05: qty 2

## 2024-01-05 MED ORDER — HEPARIN SOD (PORK) LOCK FLUSH 100 UNIT/ML IV SOLN: 250.0000 [IU] | INTRAVENOUS | Status: DC | PRN

## 2024-01-05 MED ORDER — HEPARIN SOD (PORK) LOCK FLUSH 100 UNIT/ML IV SOLN
500.0000 [IU] | Freq: Every day | INTRAVENOUS | Status: AC | PRN
Start: 2024-01-05 — End: 2024-01-05
  Administered 2024-01-05: 500 [IU]

## 2024-01-05 MED ORDER — DIPHENHYDRAMINE HCL 25 MG PO CAPS
25.0000 mg | ORAL_CAPSULE | Freq: Once | ORAL | Status: AC
Start: 2024-01-05 — End: 2024-01-05
  Administered 2024-01-05: 25 mg via ORAL
  Filled 2024-01-05: qty 1

## 2024-01-05 NOTE — Patient Instructions (Signed)

## 2024-01-08 ENCOUNTER — Encounter: Payer: Self-pay | Admitting: Hematology & Oncology

## 2024-01-08 LAB — TYPE AND SCREEN
ABO/RH(D): A POS
Antibody Screen: NEGATIVE
Unit division: 0

## 2024-01-08 LAB — BPAM RBC
Blood Product Expiration Date: 202502042359
ISSUE DATE / TIME: 202501100719
Unit Type and Rh: 6200

## 2024-01-31 ENCOUNTER — Inpatient Hospital Stay: Payer: Medicare Other | Attending: Hematology & Oncology

## 2024-01-31 ENCOUNTER — Inpatient Hospital Stay: Payer: Medicare Other

## 2024-01-31 ENCOUNTER — Inpatient Hospital Stay (HOSPITAL_BASED_OUTPATIENT_CLINIC_OR_DEPARTMENT_OTHER): Payer: Medicare Other | Admitting: Hematology & Oncology

## 2024-01-31 ENCOUNTER — Encounter: Payer: Self-pay | Admitting: Hematology & Oncology

## 2024-01-31 ENCOUNTER — Encounter: Payer: Self-pay | Admitting: *Deleted

## 2024-01-31 VITALS — BP 174/60 | HR 55 | Temp 98.0°F | Resp 20 | Ht 66.0 in | Wt 182.1 lb

## 2024-01-31 DIAGNOSIS — T451X5D Adverse effect of antineoplastic and immunosuppressive drugs, subsequent encounter: Secondary | ICD-10-CM | POA: Diagnosis not present

## 2024-01-31 DIAGNOSIS — D6481 Anemia due to antineoplastic chemotherapy: Secondary | ICD-10-CM | POA: Diagnosis present

## 2024-01-31 DIAGNOSIS — C50911 Malignant neoplasm of unspecified site of right female breast: Secondary | ICD-10-CM

## 2024-01-31 DIAGNOSIS — Z9011 Acquired absence of right breast and nipple: Secondary | ICD-10-CM | POA: Diagnosis not present

## 2024-01-31 DIAGNOSIS — Z1722 Progesterone receptor negative status: Secondary | ICD-10-CM | POA: Insufficient documentation

## 2024-01-31 DIAGNOSIS — D631 Anemia in chronic kidney disease: Secondary | ICD-10-CM

## 2024-01-31 DIAGNOSIS — Z171 Estrogen receptor negative status [ER-]: Secondary | ICD-10-CM | POA: Diagnosis not present

## 2024-01-31 DIAGNOSIS — Z1731 Human epidermal growth factor receptor 2 positive status: Secondary | ICD-10-CM | POA: Diagnosis not present

## 2024-01-31 LAB — CBC WITH DIFFERENTIAL (CANCER CENTER ONLY)
Abs Immature Granulocytes: 0.05 10*3/uL (ref 0.00–0.07)
Basophils Absolute: 0 10*3/uL (ref 0.0–0.1)
Basophils Relative: 0 %
Eosinophils Absolute: 0.1 10*3/uL (ref 0.0–0.5)
Eosinophils Relative: 1 %
HCT: 32.8 % — ABNORMAL LOW (ref 36.0–46.0)
Hemoglobin: 11.3 g/dL — ABNORMAL LOW (ref 12.0–15.0)
Immature Granulocytes: 1 %
Lymphocytes Relative: 26 %
Lymphs Abs: 2.1 10*3/uL (ref 0.7–4.0)
MCH: 34.9 pg — ABNORMAL HIGH (ref 26.0–34.0)
MCHC: 34.5 g/dL (ref 30.0–36.0)
MCV: 101.2 fL — ABNORMAL HIGH (ref 80.0–100.0)
Monocytes Absolute: 0.8 10*3/uL (ref 0.1–1.0)
Monocytes Relative: 10 %
Neutro Abs: 5.1 10*3/uL (ref 1.7–7.7)
Neutrophils Relative %: 62 %
Platelet Count: 215 10*3/uL (ref 150–400)
RBC: 3.24 MIL/uL — ABNORMAL LOW (ref 3.87–5.11)
RDW: 13.1 % (ref 11.5–15.5)
WBC Count: 8.1 10*3/uL (ref 4.0–10.5)
nRBC: 0 % (ref 0.0–0.2)

## 2024-01-31 LAB — RETICULOCYTES
Immature Retic Fract: 5.9 % (ref 2.3–15.9)
RBC.: 3.3 MIL/uL — ABNORMAL LOW (ref 3.87–5.11)
Retic Count, Absolute: 40.6 10*3/uL (ref 19.0–186.0)
Retic Ct Pct: 1.2 % (ref 0.4–3.1)

## 2024-01-31 LAB — CMP (CANCER CENTER ONLY)
ALT: 9 U/L (ref 0–44)
AST: 15 U/L (ref 15–41)
Albumin: 4 g/dL (ref 3.5–5.0)
Alkaline Phosphatase: 61 U/L (ref 38–126)
Anion gap: 8 (ref 5–15)
BUN: 28 mg/dL — ABNORMAL HIGH (ref 8–23)
CO2: 27 mmol/L (ref 22–32)
Calcium: 9.9 mg/dL (ref 8.9–10.3)
Chloride: 100 mmol/L (ref 98–111)
Creatinine: 1.65 mg/dL — ABNORMAL HIGH (ref 0.44–1.00)
GFR, Estimated: 33 mL/min — ABNORMAL LOW (ref >60)
Glucose, Bld: 87 mg/dL (ref 70–99)
Potassium: 4.1 mmol/L (ref 3.5–5.1)
Sodium: 135 mmol/L (ref 135–145)
Total Bilirubin: 0.5 mg/dL (ref 0.0–1.2)
Total Protein: 7.1 g/dL (ref 6.5–8.1)

## 2024-01-31 LAB — IRON AND IRON BINDING CAPACITY (CC-WL,HP ONLY)
Iron: 79 ug/dL (ref 28–170)
Saturation Ratios: 22 % (ref 10.4–31.8)
TIBC: 354 ug/dL (ref 250–450)
UIBC: 275 ug/dL (ref 148–442)

## 2024-01-31 LAB — FERRITIN: Ferritin: 126 ng/mL (ref 11–307)

## 2024-01-31 MED ORDER — HEPARIN SOD (PORK) LOCK FLUSH 100 UNIT/ML IV SOLN
500.0000 [IU] | Freq: Once | INTRAVENOUS | Status: AC
  Administered 2024-01-31: 500 [IU] via INTRAVENOUS

## 2024-01-31 MED ORDER — SODIUM CHLORIDE 0.9% FLUSH
10.0000 mL | Freq: Once | INTRAVENOUS | Status: AC
  Administered 2024-01-31: 10 mL

## 2024-01-31 NOTE — Progress Notes (Signed)
 Patient is feeling better, but would like to have a treatment vacation. Dr Timmy would like a PET to evaluate for any active disease before deciding how to move forward with treatment.   Oncology Nurse Navigator Documentation     01/31/2024   10:15 AM  Oncology Nurse Navigator Flowsheets  Navigator Follow Up Date: 02/19/2024  Navigator Follow Up Reason: Scan Review  Navigator Location [facility]  Navigator Encounter Type Follow-up Appt  Patient Visit Type MedOnc  Treatment Phase Other  Barriers/Navigation Needs Coordination of Care;Education  Interventions None Required  Acuity Level 2-Minimal Needs (1-2 Barriers Identified)  Support Groups/Services Friends and Family  Time Spent with Patient 15

## 2024-01-31 NOTE — Progress Notes (Signed)
 K. Hematology and Oncology Follow Up Visit  Brandi Roberts [id number] 1956-10-25 68 y.o. 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  Thankfully, she is looking so much better.  The last time that we saw her, she had been in the ICU at a local hospital with profound hypotension and neutropenia.  I think was obvious that she really cannot tolerate any further treatment..  She was having a whole lot of problems with diarrhea.  This is also improved.  We have had to transfuse her.  The last time that she was here, her hemoglobin was 8 and we gave her 2 units of blood.  Her last tumor marker-CA 27.29-was 36 back in early January.  She, again, is feeling better.  She is eating better.  She was able to enjoy some of the nice warm weather yesterday.  She has had no rashes.  She has had no mouth sores.  There has been no cough.  She has had no bleeding.  Overall, I would have said that her performance status is probably ECOG 1.      Medications:  Allergies as of 01/31/2024       Reactions   Levaquin [levofloxacin] Nausea And Vomiting, Other (See Comments)   dizziness        Medication List        Accurate as of January 31, 2024 10:44 AM. If you have any questions, ask your nurse or doctor.          STOP taking these medications    Iron 325 (65 Fe) MG Tabs Stopped by: Parthenia Tellefsen R Shaney Deckman   magnesium oxide 400 (240 Mg) MG tablet Commonly known as: MAG-OX Stopped by: Maude JONELLE Crease       TAKE  these medications    albuterol  108 (90 Base) MCG/ACT inhaler Commonly known as: VENTOLIN  HFA Inhale 2 puffs into the lungs every 6 (six) hours as needed.   ALPRAZolam  0.25 MG tablet Commonly known as: XANAX  Take 1 tablet (0.25 mg total) by mouth 3 (three) times daily as needed for anxiety.   amLODipine 10 MG tablet Commonly known as: NORVASC Take 10 mg by mouth daily.   atorvastatin  10 MG tablet Commonly known as: LIPITOR Take 5 mg by mouth at bedtime.   benazepril  40 MG tablet Commonly known as: LOTENSIN  Take 40 mg by mouth daily.   diphenoxylate -atropine  2.5-0.025 MG tablet Commonly known as: LOMOTIL  Take 2 tablets by mouth 4 (four) times daily as needed for diarrhea or loose stools.   doxazosin 2 MG tablet Commonly known as: CARDURA Take 2 mg by mouth 2 (two) times daily.   Eliquis 5 MG Tabs tablet Generic drug: apixaban Take 5 mg by mouth 2 (two) times daily.   fluconazole  200 MG tablet Commonly known as: DIFLUCAN  Take 1 tablet (200 mg total) by mouth daily.   gabapentin  600 MG tablet Commonly known as: NEURONTIN  Take 600 mg by mouth at bedtime.   hydrALAZINE  50 MG tablet Commonly known as: APRESOLINE  Take 50 mg by mouth  2 (two) times daily.   loperamide  HCl 1 MG/7.5ML suspension Commonly known as: IMODIUM  Take 15 mLs (2 mg total) by mouth as needed for diarrhea or loose stools.   magic mouthwash w/lidocaine  Soln Swish and spit 5 mls by mouth before meals and at bedtime for mouth sores.   lidocaine -diphenhydrAMINE -alum & mag hydroxide-simeth-nystatin  Swish and spit 5 mLs by mouth 4 (four) times daily -  before meals and at bedtime for mouth sores.   magnesium oxide 400 MG tablet Commonly known as: MAG-OX Take 1 tablet by mouth 2 (two) times daily.   metFORMIN 1000 MG tablet Commonly known as: GLUCOPHAGE Take 1,000 mg by mouth 2 (two) times daily.   metoprolol  succinate 100 MG 24 hr tablet Commonly known as: TOPROL -XL Take 100 mg by mouth daily.    oxyCODONE  5 MG immediate release tablet Commonly known as: Oxy IR/ROXICODONE  Take 1 tablet (5 mg total) by mouth every 6 (six) hours as needed for moderate pain, severe pain or breakthrough pain.   potassium chloride  SA 20 MEQ tablet Commonly known as: KLOR-CON  M Take 20 mEq by mouth daily.   triamterene -hydrochlorothiazide  75-50 MG tablet Commonly known as: MAXZIDE  Take 1 tablet by mouth daily.   Vitamin D (Ergocalciferol) 1.25 MG (50000 UNIT) Caps capsule Commonly known as: DRISDOL Take 50,000 Units by mouth once a week.        Allergies:  Allergies  Allergen Reactions   Levaquin [Levofloxacin] Nausea And Vomiting and Other (See Comments)    dizziness    Past Medical History, Surgical history, Social history, and Family History were reviewed and updated.  Review of Systems: Review of Systems  Constitutional: Negative.   HENT: Negative.    Eyes: Negative.   Respiratory: Negative.    Cardiovascular: Negative.   Gastrointestinal: Negative.   Genitourinary: Negative.   Musculoskeletal: Negative.   Skin: Negative.   Neurological: Negative.   Endo/Heme/Allergies: Negative.   Psychiatric/Behavioral: Negative.       Physical Exam:  height is 5' 6 (1.676 m) and weight is 182 lb 1.9 oz (82.6 kg). Her oral temperature is 98 F (36.7 C). Her blood pressure is 174/60 (abnormal) and her pulse is 55 (abnormal). Her respiration is 20 and oxygen saturation is 100%.   Wt Readings from Last 3 Encounters:  01/31/24 182 lb 1.9 oz (82.6 kg)  01/04/24 184 lb 1.9 oz (83.5 kg)  11/20/23 191 lb (86.6 kg)    Physical Exam Vitals reviewed.  Constitutional:      Comments: Her breast exam shows left breast with no masses, edema or erythema.  There is no left axillary adenopathy.  Right chest wall shows healed mastectomy scar.  There is little bit of hyperpigmentation.  She has had no masses.  There is no erythema.  There is no right axillary adenopathy.  HENT:     Head:  Normocephalic and atraumatic.  Eyes:     Pupils: Pupils are equal, round, and reactive to light.  Cardiovascular:     Rate and Rhythm: Normal rate and regular rhythm.     Heart sounds: Normal heart sounds.  Pulmonary:     Effort: Pulmonary effort is normal.     Breath sounds: Normal breath sounds.  Abdominal:     General: Bowel sounds are normal.     Palpations: Abdomen is soft.  Musculoskeletal:        General: No tenderness or deformity. Normal range of motion.     Cervical back: Normal range of motion.  Lymphadenopathy:  Cervical: No cervical adenopathy.  Skin:    General: Skin is warm and dry.     Findings: No erythema or rash.  Neurological:     Mental Status: She is alert and oriented to person, place, and time.  Psychiatric:        Behavior: Behavior normal.        Thought Content: Thought content normal.        Judgment: Judgment normal.     Lab Results  Component Value Date   WBC 8.1 01/31/2024   HGB 11.3 (L) 01/31/2024   HCT 32.8 (L) 01/31/2024   MCV 101.2 (H) 01/31/2024   PLT 215 01/31/2024   Lab Results  Component Value Date   FERRITIN 209 01/04/2024   IRON 95 01/04/2024   TIBC 333 01/04/2024   UIBC 238 01/04/2024   IRONPCTSAT 29 01/04/2024   Lab Results  Component Value Date   RETICCTPCT 1.2 01/31/2024   RBC 3.30 (L) 01/31/2024   RBC 3.24 (L) 01/31/2024   No results found for: KPAFRELGTCHN, LAMBDASER, KAPLAMBRATIO No results found for: IGGSERUM, IGA, IGMSERUM No results found for: STEPHANY CARLOTA BENSON MARKEL EARLA JOANNIE DOC, MSPIKE, SPEI   Chemistry      Component Value Date/Time   NA 135 01/31/2024 1005   K 4.1 01/31/2024 1005   CL 100 01/31/2024 1005   CO2 27 01/31/2024 1005   BUN 28 (H) 01/31/2024 1005   CREATININE 1.65 (H) 01/31/2024 1005      Component Value Date/Time   CALCIUM  9.9 01/31/2024 1005   ALKPHOS 61 01/31/2024 1005   AST 15 01/31/2024 1005   ALT 9 01/31/2024 1005   BILITOT  0.5 01/31/2024 1005       Impression and Plan: Brandi Roberts is a pleasant 68 yo African American female with invasive ductal carcinoma of the right breast, ER-/PR-/HER2 + treated with mastectomy.   She had 1 positive lymph node.  Unfortunately, she recurred quickly after anti-HER2 therapy.  She has had 4 cycles of chemotherapy with anti-HER2 therapy.  Again, she has had an incredibly horrible time.  At this point, I does want to do a PET scan on her.  I would like to do a PET scan and see if she has anything that might be considered active.  When we initially treated her, she had recurrence in the right axilla that was resected.  I think a PET scan would be reasonable.  If we see anything on the PET scan that looks suspicious, we could certainly consider Enhertu for her.  I really think she is have a vacation from treatment.  She really had a such difficult time.  I felt so bad for her.  Even with dose reductions, she really bottomed out her blood counts.  We will see about the PET scan in about 3 to 4 weeks.  I will then see her back in 6 weeks.   Maude JONELLE Crease, MD 2/5/202510:44 AM

## 2024-01-31 NOTE — Progress Notes (Signed)
 BP remains elevated, 174/60, has not taken any BP meds this morning. Instructed to monitor at home to ensure it is less than 140/90 when taking medication. Verbalized understanding.  Is planning on taking meds while in the office.

## 2024-02-01 ENCOUNTER — Other Ambulatory Visit: Payer: Self-pay

## 2024-02-01 DIAGNOSIS — C50911 Malignant neoplasm of unspecified site of right female breast: Secondary | ICD-10-CM

## 2024-02-01 LAB — CANCER ANTIGEN 27.29: CA 27.29: 32.3 U/mL (ref 0.0–38.6)

## 2024-02-01 MED ORDER — ALPRAZOLAM 0.25 MG PO TABS: 0.2500 mg | ORAL_TABLET | Freq: Three times a day (TID) | ORAL | 0 refills | Status: DC | PRN

## 2024-02-01 MED ORDER — DIPHENOXYLATE-ATROPINE 2.5-0.025 MG PO TABS: 2.0000 | ORAL_TABLET | Freq: Four times a day (QID) | ORAL | 0 refills | Status: AC | PRN

## 2024-02-02 ENCOUNTER — Encounter: Payer: Self-pay | Admitting: Hematology & Oncology

## 2024-02-19 ENCOUNTER — Encounter (HOSPITAL_COMMUNITY)
Admission: RE | Admit: 2024-02-19 | Discharge: 2024-02-19 | Disposition: A | Discharge status: Home / Self Care | Payer: Medicare Other | Source: Ambulatory Visit | Attending: Hematology & Oncology | Admitting: Hematology & Oncology

## 2024-02-19 DIAGNOSIS — Z171 Estrogen receptor negative status [ER-]: Secondary | ICD-10-CM | POA: Diagnosis present

## 2024-02-19 DIAGNOSIS — C50911 Malignant neoplasm of unspecified site of right female breast: Secondary | ICD-10-CM | POA: Diagnosis present

## 2024-02-19 LAB — GLUCOSE, CAPILLARY: Glucose-Capillary: 89 mg/dL (ref 70–99)

## 2024-02-19 MED ORDER — FLUDEOXYGLUCOSE F - 18 (FDG) INJECTION
9.1000 | Freq: Once | INTRAVENOUS | Status: AC
  Administered 2024-02-19: 9.08 via INTRAVENOUS

## 2024-02-22 ENCOUNTER — Other Ambulatory Visit: Payer: Self-pay | Admitting: Hematology & Oncology

## 2024-02-22 DIAGNOSIS — C50911 Malignant neoplasm of unspecified site of right female breast: Secondary | ICD-10-CM

## 2024-03-08 ENCOUNTER — Encounter: Payer: Self-pay | Admitting: *Deleted

## 2024-03-08 NOTE — Progress Notes (Signed)
 Reviewed PET which show no active malignancy.  Oncology Nurse Navigator Documentation     03/08/2024    8:30 AM  Oncology Nurse Navigator Flowsheets  Navigator Follow Up Date: 03/14/2024  Navigator Follow Up Reason: Follow-up Appointment  Navigator Location [facility]  Navigator Encounter Type Scan Review  Patient Visit Type MedOnc  Barriers/Navigation Needs No Barriers At This Time  Interventions None Required  Acuity Level 1-No Barriers  Support Groups/Services Friends and Family  Time Spent with Patient 15

## 2024-03-14 ENCOUNTER — Inpatient Hospital Stay (HOSPITAL_BASED_OUTPATIENT_CLINIC_OR_DEPARTMENT_OTHER): Payer: Medicare Other | Admitting: Hematology & Oncology

## 2024-03-14 ENCOUNTER — Telehealth: Payer: Self-pay

## 2024-03-14 ENCOUNTER — Encounter: Payer: Self-pay | Admitting: *Deleted

## 2024-03-14 ENCOUNTER — Inpatient Hospital Stay: Payer: Medicare Other | Attending: Hematology & Oncology

## 2024-03-14 ENCOUNTER — Inpatient Hospital Stay: Payer: Medicare Other

## 2024-03-14 ENCOUNTER — Encounter: Payer: Self-pay | Admitting: Hematology & Oncology

## 2024-03-14 VITALS — BP 181/52 | HR 64 | Temp 97.7°F | Resp 18 | Ht 66.0 in | Wt 189.4 lb

## 2024-03-14 DIAGNOSIS — Z1722 Progesterone receptor negative status: Secondary | ICD-10-CM | POA: Insufficient documentation

## 2024-03-14 DIAGNOSIS — Z9011 Acquired absence of right breast and nipple: Secondary | ICD-10-CM | POA: Insufficient documentation

## 2024-03-14 DIAGNOSIS — T451X5D Adverse effect of antineoplastic and immunosuppressive drugs, subsequent encounter: Secondary | ICD-10-CM | POA: Diagnosis not present

## 2024-03-14 DIAGNOSIS — D6481 Anemia due to antineoplastic chemotherapy: Secondary | ICD-10-CM | POA: Insufficient documentation

## 2024-03-14 DIAGNOSIS — Z171 Estrogen receptor negative status [ER-]: Secondary | ICD-10-CM

## 2024-03-14 DIAGNOSIS — C50911 Malignant neoplasm of unspecified site of right female breast: Secondary | ICD-10-CM | POA: Insufficient documentation

## 2024-03-14 DIAGNOSIS — Z1731 Human epidermal growth factor receptor 2 positive status: Secondary | ICD-10-CM | POA: Diagnosis not present

## 2024-03-14 DIAGNOSIS — Z95828 Presence of other vascular implants and grafts: Secondary | ICD-10-CM

## 2024-03-14 DIAGNOSIS — D631 Anemia in chronic kidney disease: Secondary | ICD-10-CM

## 2024-03-14 LAB — RETICULOCYTES
Immature Retic Fract: 5.8 % (ref 2.3–15.9)
RBC.: 2.98 MIL/uL — ABNORMAL LOW (ref 3.87–5.11)
Retic Count, Absolute: 42 10*3/uL (ref 19.0–186.0)
Retic Ct Pct: 1.4 % (ref 0.4–3.1)

## 2024-03-14 LAB — CMP (CANCER CENTER ONLY)
ALT: 11 U/L (ref 0–44)
AST: 16 U/L (ref 15–41)
Albumin: 4 g/dL (ref 3.5–5.0)
Alkaline Phosphatase: 64 U/L (ref 38–126)
Anion gap: 7 (ref 5–15)
BUN: 30 mg/dL — ABNORMAL HIGH (ref 8–23)
CO2: 27 mmol/L (ref 22–32)
Calcium: 9.3 mg/dL (ref 8.9–10.3)
Chloride: 101 mmol/L (ref 98–111)
Creatinine: 1.64 mg/dL — ABNORMAL HIGH (ref 0.44–1.00)
GFR, Estimated: 33 mL/min — ABNORMAL LOW (ref >60)
Glucose, Bld: 94 mg/dL (ref 70–99)
Potassium: 4.3 mmol/L (ref 3.5–5.1)
Sodium: 135 mmol/L (ref 135–145)
Total Bilirubin: 0.4 mg/dL (ref 0.0–1.2)
Total Protein: 6.9 g/dL (ref 6.5–8.1)

## 2024-03-14 LAB — CBC WITH DIFFERENTIAL (CANCER CENTER ONLY)
Abs Immature Granulocytes: 0.06 10*3/uL (ref 0.00–0.07)
Basophils Absolute: 0 10*3/uL (ref 0.0–0.1)
Basophils Relative: 0 %
Eosinophils Absolute: 0.1 10*3/uL (ref 0.0–0.5)
Eosinophils Relative: 1 %
HCT: 29.1 % — ABNORMAL LOW (ref 36.0–46.0)
Hemoglobin: 9.8 g/dL — ABNORMAL LOW (ref 12.0–15.0)
Immature Granulocytes: 1 %
Lymphocytes Relative: 25 %
Lymphs Abs: 1.9 10*3/uL (ref 0.7–4.0)
MCH: 33 pg (ref 26.0–34.0)
MCHC: 33.7 g/dL (ref 30.0–36.0)
MCV: 98 fL (ref 80.0–100.0)
Monocytes Absolute: 0.6 10*3/uL (ref 0.1–1.0)
Monocytes Relative: 8 %
Neutro Abs: 5 10*3/uL (ref 1.7–7.7)
Neutrophils Relative %: 65 %
Platelet Count: 179 10*3/uL (ref 150–400)
RBC: 2.97 MIL/uL — ABNORMAL LOW (ref 3.87–5.11)
RDW: 12.3 % (ref 11.5–15.5)
WBC Count: 7.6 10*3/uL (ref 4.0–10.5)
nRBC: 0 % (ref 0.0–0.2)

## 2024-03-14 LAB — IRON AND IRON BINDING CAPACITY (CC-WL,HP ONLY)
Iron: 97 ug/dL (ref 28–170)
Saturation Ratios: 30 % (ref 10.4–31.8)
TIBC: 319 ug/dL (ref 250–450)
UIBC: 222 ug/dL (ref 148–442)

## 2024-03-14 LAB — FERRITIN: Ferritin: 168 ng/mL (ref 11–307)

## 2024-03-14 MED ORDER — SODIUM CHLORIDE 0.9% FLUSH
10.0000 mL | INTRAVENOUS | Status: DC | PRN
Start: 2024-03-14 — End: 2024-03-14
  Administered 2024-03-14: 10 mL via INTRAVENOUS

## 2024-03-14 MED ORDER — HEPARIN SOD (PORK) LOCK FLUSH 100 UNIT/ML IV SOLN
500.0000 [IU] | Freq: Once | INTRAVENOUS | Status: AC
  Administered 2024-03-14: 500 [IU] via INTRAVENOUS

## 2024-03-14 NOTE — Progress Notes (Signed)
 BP remains elevated, 181/52, has not taken any BP meds today.  Instructed to monitor at home and notify PCP if it remains over 140/90. Verbalized understanding.

## 2024-03-14 NOTE — Telephone Encounter (Signed)
 Patient returned call to the office and was advised that she should follow up in our office with Quentin Mulling on 03-15-24 at 300pm for expedited colonoscopy due to abnormal PET/CT per Dr Barron Alvine.  Patient agreed to plan and verbalized understanding.  No further questions.

## 2024-03-14 NOTE — Telephone Encounter (Signed)
-----   Message from Shellia Cleverly sent at 03/14/2024 12:31 PM EDT ----- Received a call from this patient's oncologist about getting her in for expedited colonoscopy due to abnormal PET/CT.  Can you please contact her and schedule OV with me or Amanda/Deanna.  Can overbook with me if needed.  Thanks.

## 2024-03-14 NOTE — Progress Notes (Signed)
 [facility]cology Follow Up Visit  Brandi Roberts [id number] November 12, 1956 68 y.o. 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 [name] is here today for follow-up.  She looks fantastic.  She is had no complaints as well as saw her.  We did do a PET scan on her.  This was done on 02/19/2024.  The PET scan did not show any obvious recurrent breast cancer.  However, in the right side of the colon, there was an area of active activity that had an SUV of 10.  She cannot remember when her last colonoscopy was so we are going to have to get her set up with 1.  She has had no problems with cough.  She has had no nausea or vomiting.  She has had no bleeding.  Her last CA 27.29 was holding steady at 32.  She has had no fever.  There is been no rashes.  She has had no leg swelling.  Her blood pressure certainly is on the higher side.  Because of this, we will hold off on doing Aranesp today.  Overall, I would have to say that her appetite has been doing well.  She has gained a little bit weight.  Overall, I would say performance status probably ECOG 1.    Medications:  Allergies as of 03/14/2024       Reactions   Levaquin [levofloxacin] Nausea And Vomiting, Other (See Comments)   dizziness        Medication List        Accurate as of March 14, 2024 11:23 AM. If you have any questions, ask your nurse or doctor.          STOP taking these medications    fluconazole 200 MG tablet Commonly known as: DIFLUCAN Stopped by: Josph Macho       TAKE these medications    albuterol 108 (90 Base) MCG/ACT inhaler Commonly known as: VENTOLIN HFA Inhale 2 puffs into the lungs every 6 (six) hours as needed.   ALPRAZolam 0.25 MG tablet Commonly known as: XANAX TAKE 1 TABLET BY MOUTH 3 TIMES DAILY AS NEEDED FOR ANXIETY.   amLODipine 10 MG tablet Commonly known as: NORVASC Take 10 mg by mouth daily.   atorvastatin 10 MG tablet Commonly known as: LIPITOR Take 5 mg by mouth at bedtime.   benazepril 40 MG tablet Commonly known as: LOTENSIN Take 40 mg by mouth daily.   diphenoxylate-atropine 2.5-0.025 MG tablet Commonly known as: LOMOTIL Take 2 tablets by mouth 4 (four) times daily as needed for diarrhea or loose stools.   doxazosin 2 MG tablet Commonly known as: CARDURA Take 2 mg by mouth 2 (two) times daily.   Eliquis 5 MG Tabs tablet Generic drug: apixaban Take 5 mg by mouth 2 (two) times daily.   gabapentin 600 MG tablet Commonly known as: NEURONTIN Take 600 mg by mouth at bedtime.   hydrALAZINE 50 MG tablet Commonly known as: APRESOLINE Take 50 mg by mouth 2 (two) times daily.   loperamide HCl 1 MG/7.5ML suspension  Commonly known as: IMODIUM Take 15 mLs (2 mg total) by mouth as needed for diarrhea or loose stools.   magic mouthwash w/lidocaine Soln Swish and spit 5 mls by mouth before meals and at bedtime for mouth sores.   lidocaine-diphenhydrAMINE-alum & mag hydroxide-simeth-nystatin Swish and spit 5 mLs by mouth 4 (four) times daily -  before meals and at bedtime for mouth sores.   magnesium oxide 400 MG tablet Commonly known as: MAG-OX Take 1 tablet by mouth 2 (two) times daily.   metFORMIN 1000 MG tablet Commonly known as: GLUCOPHAGE Take 1,000 mg by mouth 2 (two) times daily.   metoprolol succinate 100 MG 24 hr tablet Commonly known as: TOPROL-XL Take 100 mg by mouth daily.   oxyCODONE 5 MG immediate release tablet Commonly known as: Oxy IR/ROXICODONE Take 1 tablet (5 mg  total) by mouth every 6 (six) hours as needed for moderate pain, severe pain or breakthrough pain.   potassium chloride SA 20 MEQ tablet Commonly known as: KLOR-CON M Take 20 mEq by mouth daily.   triamterene-hydrochlorothiazide 75-50 MG tablet Commonly known as: MAXZIDE Take 1 tablet by mouth daily.   Vitamin D (Ergocalciferol) 1.25 MG (50000 UNIT) Caps capsule Commonly known as: DRISDOL Take 50,000 Units by mouth once a week.        Allergies:  Allergies  Allergen Reactions   Levaquin [Levofloxacin] Nausea And Vomiting and Other (See Comments)    dizziness    Past Medical History, Surgical history, Social history, and Family History were reviewed and updated.  Review of Systems: Review of Systems  Constitutional: Negative.   HENT: Negative.    Eyes: Negative.   Respiratory: Negative.    Cardiovascular: Negative.   Gastrointestinal: Negative.   Genitourinary: Negative.   Musculoskeletal: Negative.   Skin: Negative.   Neurological: Negative.   Endo/Heme/Allergies: Negative.   Psychiatric/Behavioral: Negative.       Physical Exam:  height is 5\' 6"  (1.676 m) and weight is 189 lb 6.4 oz (85.9 kg). Her oral temperature is 97.7 F (36.5 C). Her blood pressure is 181/52 (abnormal) and her pulse is 64. Her respiration is 18 and oxygen saturation is 98%.   Wt Readings from Last 3 Encounters:  03/14/24 189 lb 6.4 oz (85.9 kg)  01/31/24 182 lb 1.9 oz (82.6 kg)  01/04/24 184 lb 1.9 oz (83.5 kg)    Physical Exam Vitals reviewed.  Constitutional:      Comments: Her breast exam shows left breast with no masses, edema or erythema.  There is no left axillary adenopathy.  Right chest wall shows healed mastectomy scar.  There is little bit of hyperpigmentation.  She has had no masses.  There is no erythema.  There is no right axillary adenopathy.  HENT:     Head: Normocephalic and atraumatic.  Eyes:     Pupils: Pupils are equal, round, and reactive to light.   Cardiovascular:     Rate and Rhythm: Normal rate and regular rhythm.     Heart sounds: Normal heart sounds.  Pulmonary:     Effort: Pulmonary effort is normal.     Breath sounds: Normal breath sounds.  Abdominal:     General: Bowel sounds are normal.     Palpations: Abdomen is soft.  Musculoskeletal:        General: No tenderness or deformity. Normal range of motion.     Cervical back: Normal range of motion.  Lymphadenopathy:     Cervical: No cervical adenopathy.  Skin:  General: Skin is warm and dry.     Findings: No erythema or rash.  Neurological:     Mental Status: She is alert and oriented to person, place, and time.  Psychiatric:        Behavior: Behavior normal.        Thought Content: Thought content normal.        Judgment: Judgment normal.     Lab Results  Component Value Date   WBC 7.6 03/14/2024   HGB 9.8 (L) 03/14/2024   HCT 29.1 (L) 03/14/2024   MCV 98.0 03/14/2024   PLT 179 03/14/2024   Lab Results  Component Value Date   FERRITIN 126 01/31/2024   IRON 79 01/31/2024   TIBC 354 01/31/2024   UIBC 275 01/31/2024   IRONPCTSAT 22 01/31/2024   Lab Results  Component Value Date   RETICCTPCT 1.4 03/14/2024   RBC 2.98 (L) 03/14/2024   RBC 2.97 (L) 03/14/2024   No results found for: "KPAFRELGTCHN", "LAMBDASER", "KAPLAMBRATIO" No results found for: "IGGSERUM", "IGA", "IGMSERUM" No results found for: "TOTALPROTELP", "ALBUMINELP", "A1GS", "A2GS", "BETS", "BETA2SER", "GAMS", "MSPIKE", "SPEI"   Chemistry      Component Value Date/Time   NA 135 03/14/2024 1025   K 4.3 03/14/2024 1025   CL 101 03/14/2024 1025   CO2 27 03/14/2024 1025   BUN 30 (H) 03/14/2024 1025   CREATININE 1.64 (H) 03/14/2024 1025      Component Value Date/Time   CALCIUM 9.3 03/14/2024 1025   ALKPHOS 64 03/14/2024 1025   AST 16 03/14/2024 1025   ALT 11 03/14/2024 1025   BILITOT 0.4 03/14/2024 1025       Impression and Plan: [name] is a pleasant 68 yo African American  female with invasive ductal carcinoma of the right breast, ER-/PR-/HER2 + treated with mastectomy.   She had 1 positive lymph node.  Unfortunately, she recurred quickly after anti-HER2 therapy.  She has had 4 cycles of chemotherapy with anti-HER2 therapy.  Again, she has had an incredibly horrible time.  We have to figure out what might be going on with the right colon.  Again I do not believe this is going to be anything malignant or anything even closely related to breast cancer.  I am glad that her tumor marker was stable back in February.  We will see what it is today.  I will have her come back in 6 weeks.  I will want to try to hold off on any further therapy for her.  I just do not think that we are really going to benefit her right now by giving her any treatment.   Josph Macho, MD 3/20/202511:23 AM

## 2024-03-14 NOTE — Telephone Encounter (Signed)
 Left message for patient to return call to our office to schedule new patient appointment.  Will continue efforts.

## 2024-03-14 NOTE — Progress Notes (Unsigned)
 Patient is doing well. Her recent PET showed no malignancy, however there was questionable activity in her colon. She will need a colonoscopy. Dr Myna Hidalgo spoke with GI and they will get her scheduled.   Oncology Nurse Navigator Documentation     03/14/2024    2:00 PM  Oncology Nurse Navigator Flowsheets  Navigator Follow Up Date: 04/25/2024  Navigator Follow Up Reason: Follow-up Appointment  Navigator Location [facility]  Navigator Encounter Type Follow-up Appt  Patient Visit Type MedOnc  Barriers/Navigation Needs No Barriers At This Time  Interventions None Required  Acuity Level 1-No Barriers  Support Groups/Services Friends and Family  Time Spent with Patient 15

## 2024-03-15 ENCOUNTER — Ambulatory Visit: Admitting: Physician Assistant

## 2024-03-15 ENCOUNTER — Telehealth: Payer: Self-pay

## 2024-03-15 ENCOUNTER — Encounter: Payer: Self-pay | Admitting: Physician Assistant

## 2024-03-15 ENCOUNTER — Encounter: Payer: Self-pay | Admitting: Hematology & Oncology

## 2024-03-15 VITALS — BP 130/70 | HR 64 | Ht 66.0 in | Wt 192.0 lb

## 2024-03-15 DIAGNOSIS — Z853 Personal history of malignant neoplasm of breast: Secondary | ICD-10-CM

## 2024-03-15 DIAGNOSIS — D6481 Anemia due to antineoplastic chemotherapy: Secondary | ICD-10-CM

## 2024-03-15 DIAGNOSIS — Z800 Family history of malignant neoplasm of digestive organs: Secondary | ICD-10-CM | POA: Diagnosis not present

## 2024-03-15 DIAGNOSIS — Z9011 Acquired absence of right breast and nipple: Secondary | ICD-10-CM

## 2024-03-15 DIAGNOSIS — R948 Abnormal results of function studies of other organs and systems: Secondary | ICD-10-CM | POA: Diagnosis not present

## 2024-03-15 DIAGNOSIS — Z171 Estrogen receptor negative status [ER-]: Secondary | ICD-10-CM

## 2024-03-15 DIAGNOSIS — C50911 Malignant neoplasm of unspecified site of right female breast: Secondary | ICD-10-CM

## 2024-03-15 DIAGNOSIS — Z7901 Long term (current) use of anticoagulants: Secondary | ICD-10-CM

## 2024-03-15 DIAGNOSIS — D631 Anemia in chronic kidney disease: Secondary | ICD-10-CM

## 2024-03-15 LAB — CANCER ANTIGEN 27.29: CA 27.29: 29.9 U/mL (ref 0.0–38.6)

## 2024-03-15 MED ORDER — SUFLAVE 178.7 G PO SOLR
1.0000 | Freq: Once | ORAL | Status: AC
Start: 2024-03-15 — End: 2024-03-15

## 2024-03-15 NOTE — Progress Notes (Signed)
 03/15/2024 Brandi Roberts [id number] June 26, 1956  Referring provider: Leone Payor, MD Primary GI doctor: Dr. Leone Roberts  ASSESSMENT AND PLAN:   Abnormal PET scan 02/15/2024 Focal extenuated metabolic activity right: Maximum 10 SUV most likely physiologic activity but neoplasm cannot totally be excluded.  This was not seen on PET scan 07/2023 Remote colonoscopy family history of colon cancer in her father No melena, no hematochezia, Can have dark stool with iron, had diarrhea and weight loss with the chemotherapy but otherwise this has resolved since being off chemotherapy Will get the colonoscopy at the [facility] with holding eliquis, will schedule with soonest available, Dr. Leone Roberts We have discussed the risks of bleeding, infection, perforation, medication reactions, and remote risk of death associated with colonoscopy. All questions were answered and the patient acknowledges these risk and wishes to proceed.  Stage IIa infiltrating ductal carcinoma of right breast  status post mastectomy 03/22/2022  completed 4 cycles chemotherapy 01/04/2024 had to stop due to complications and hospitalization, has not been on anything since that time Following with Dr. 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 Toni Roberts in [place], PCP Hold Eliquis for 2 days before procedure will instruct when and how to resume after procedure.  Patient understands that there is a low but real risk of cardiovascular event such as heart attack, stroke, or embolism /   thrombosis, or ischemia while off Eliquis  The patient consents to proceed.  Will communicate by phone or EMR with patient's prescribing provider to confirm that holding Eliquis is reasonable in this case.    Patient Care Team: Brandi Payor, MD as PCP - General (Internal Medicine) Brandi Hanson, RN as Oncology Nurse Navigator Brandi Macho, MD as Medical Oncologist (Oncology)  HISTORY OF PRESENT ILLNESS:  Discussed the use of AI scribe software for clinical note transcription with the patient, who gave verbal consent to proceed.  History of Present Illness   Brandi Roberts is a 68 year old female who presents for evaluation of possible colon activity seen on a recent PET scan. She was referred for evaluation of possible colon activity seen on a recent PET scan.  A recent PET scan indicated possible activity on the right side of her colon, which was not present in a previous scan from August 2024. She has not had a colonoscopy in over ten years. No family history of colon cancer. No dark or black stools, blood in the stool, or significant changes in bowel habits. Her stools were brownish this morning, which she attributes to iron tablets.  She has a history of ductal carcinoma for which she underwent a mastectomy in 2023, followed by chemotherapy and radiation. Chemotherapy was stopped after four treatments due to complications, including severe diarrhea and hospitalization. Anemia developed as a result of chemotherapy treatment. No current symptoms related to cancer recurrence. Since stopping chemotherapy in January, her bowel movements have normalized. No recent weight loss, abdominal pain, or constipation since completing chemotherapy. No history of heartburn or trouble swallowing,  except when taking large potassium pills.  She is currently on Eliquis for a history of deep vein thrombosis (DVT). She uses an inhaler occasionally when rushing but reports no significant  chest pain or shortness of breath. She has some swelling and stasis dermatitis in her left leg but reports no pain.  Her father had lung cancer and passed away at the age of 34.        She  reports that she quit smoking about 7 years ago. Her smoking use included cigarettes. She started smoking about 22 years ago. She has a 7.5 pack-year smoking history. She has never used smokeless tobacco. She reports that she does not drink alcohol and does not use drugs.  RELEVANT GI HISTORY, IMAGING AND LABS: Results   RADIOLOGY PET scan: Activity on the right side of the colon (07/2023)     PET 02/19/2024 IMPRESSION: 1. Interval resection of the previous right axillary lymph node. No findings of active malignancy in the chest. 2. Focal accentuated metabolic activity in the right colon with maximum SUV 10.1, previous maximum SUV about 5.3 in this vicinity without the same degree of focality on the previous exam. This is most likely to be physiologic activity, strictly speaking colon neoplasm is not totally excluded. Correlation with the patient's colon cancer screening history is recommended. If screening is not up-to-date, appropriate screening should be considered. 3. Focal hypermetabolic activity and mildly accentuated density along the inferior margin of the right groin crease between the thigh and the right labium measuring about 1.0 cm in short axis with maximum SUV 8.1. No abnormality in this region on prior exam. This is technically nonspecific and conceivably could be inflammatory or artifactual, the finding is immediately cutaneous or subcutaneous rather than representing a inguinal lymph node. This would be a very unusual pattern for metastatic spread of breast cancer. 4. Chronic thyroid goiter especially involving the left lobe, previous nuclear medicine thyroid scan workup in 2004. 5. Lower lumbar spondylosis and degenerative disc disease. 6. Aortic Atherosclerosis (ICD10-I70.0).  Systemic and coronary atherosclerosis.  PET 07/27/2023 IMPRESSION: 1. Hypermetabolic right axillary adenopathy, consistent with disease recurrence. No evidence of distant metastatic disease. 2. Small to borderline enlarged retroperitoneal and inguinal lymph nodes do not show abnormal hypermetabolism. 3. Complex left thyroid nodule. Recommend thyroid ultrasound. (Ref: J Am Coll Radiol. 2015 Feb;12(2): 143-50). 4. Aortic atherosclerosis (ICD10-I70.0). Coronary artery calcification. 5.  Emphysema (ICD10-J43.9).   CBC    Component Value Date/Time   WBC 7.6 03/14/2024 1025   WBC 11.7 (H) 10/03/2021 1556   RBC 2.98 (L) 03/14/2024 1025   RBC 2.97 (L) 03/14/2024 1025   HGB 9.8 (L) 03/14/2024 1025   HGB 14.4 04/02/2008 0952   HCT 29.1 (L) 03/14/2024 1025   HCT 40.0 04/02/2008 0952   PLT 179 03/14/2024 1025   PLT 291 04/02/2008 0952   MCV 98.0 03/14/2024 1025   MCV 88.2 04/02/2008 0952   MCH 33.0 03/14/2024 1025   MCHC 33.7 03/14/2024 1025   RDW 12.3 03/14/2024 1025   RDW 12.4 04/02/2008 0952   LYMPHSABS 1.9 03/14/2024 1025   LYMPHSABS 3.1 04/02/2008 0952   MONOABS 0.6 03/14/2024 1025   MONOABS 0.5 04/02/2008 0952   EOSABS 0.1 03/14/2024 1025   EOSABS 0.1 04/02/2008 0952   BASOSABS 0.0 03/14/2024 1025   BASOSABS 0.0 04/02/2008 0952   Recent Labs    09/07/23 1459 09/13/23 0810 09/21/23 1310 10/04/23 0913 10/31/23 0803 11/20/23 0815 11/28/23 1115 01/04/24 1133 01/31/24 1005 03/14/24 1025  HGB  10.7* 10.6* 9.9* 9.3* 8.3* 8.5* 8.9* 8.0* 11.3* 9.8*    CMP     Component Value Date/Time   NA 135 03/14/2024 1025   K 4.3 03/14/2024 1025   CL 101 03/14/2024 1025   CO2 27 03/14/2024 1025   GLUCOSE 94 03/14/2024 1025   BUN 30 (H) 03/14/2024 1025   CREATININE 1.64 (H) 03/14/2024 1025   CALCIUM 9.3 03/14/2024 1025   PROT 6.9 03/14/2024 1025   ALBUMIN 4.0 03/14/2024 1025   AST 16 03/14/2024 1025   ALT 11 03/14/2024 1025   ALKPHOS 64 03/14/2024 1025   BILITOT 0.4  03/14/2024 1025   GFRNONAA 33 (L) 03/14/2024 1025      Latest Ref Rng & Units 03/14/2024   10:25 AM 01/31/2024   10:05 AM 01/04/2024   11:33 AM  Hepatic Function  Total Protein 6.5 - 8.1 g/dL 6.9  7.1  6.4   Albumin 3.5 - 5.0 g/dL 4.0  4.0  3.8   AST 15 - 41 U/L 16  15  16    ALT 0 - 44 U/L 11  9  10    Alk Phosphatase 38 - 126 U/L 64  61  52   Total Bilirubin 0.0 - 1.2 mg/dL 0.4  0.5  0.4       Current Medications:   Current Outpatient Medications (Endocrine & Metabolic):    metFORMIN (GLUCOPHAGE) 1000 MG tablet, Take 1,000 mg by mouth 2 (two) times daily.   Current Outpatient Medications (Cardiovascular):    amLODipine (NORVASC) 10 MG tablet, Take 10 mg by mouth daily.   atorvastatin (LIPITOR) 10 MG tablet, Take 5 mg by mouth at bedtime.   benazepril (LOTENSIN) 40 MG tablet, Take 40 mg by mouth daily.   doxazosin (CARDURA) 2 MG tablet, Take 2 mg by mouth 2 (two) times daily.   hydrALAZINE (APRESOLINE) 50 MG tablet, Take 50 mg by mouth 2 (two) times daily.   metoprolol succinate (TOPROL-XL) 100 MG 24 hr tablet, Take 100 mg by mouth daily.   triamterene-hydrochlorothiazide (MAXZIDE) 75-50 MG per tablet, Take 1 tablet by mouth daily.   Current Outpatient Medications (Respiratory):    albuterol (VENTOLIN HFA) 108 (90 Base) MCG/ACT inhaler, Inhale 2 puffs into the lungs every 6 (six) hours as needed.   lidocaine-diphenhydrAMINE-alum & mag hydroxide-simeth-nystatin*, Swish and spit 5 mLs by mouth 4 (four) times daily -  before meals and at bedtime for mouth sores.   magic mouthwash w/lidocaine SOLN*, Swish and spit 5 mls by mouth before meals and at bedtime for mouth sores.   Facility-Administered Medications Ordered in Other Visits (Respiratory):    diphenhydrAMINE (BENADRYL) 25 mg capsule  Current Outpatient Medications (Analgesics):    oxyCODONE (OXY IR/ROXICODONE) 5 MG immediate release tablet, Take 1 tablet (5 mg total) by mouth every 6 (six) hours as needed for moderate pain,  severe pain or breakthrough pain. (Patient not taking: Reported on 03/15/2024)   Current Outpatient Medications (Hematological):    ELIQUIS 5 MG TABS tablet, Take 5 mg by mouth 2 (two) times daily.   Current Outpatient Medications (Other):    ALPRAZolam (XANAX) 0.25 MG tablet, TAKE 1 TABLET BY MOUTH 3 TIMES DAILY AS NEEDED FOR ANXIETY.   diphenoxylate-atropine (LOMOTIL) 2.5-0.025 MG tablet, Take 2 tablets by mouth 4 (four) times daily as needed for diarrhea or loose stools.   gabapentin (NEURONTIN) 600 MG tablet, Take 600 mg by mouth at bedtime.   lidocaine-diphenhydrAMINE-alum & mag hydroxide-simeth-nystatin*, Swish and spit 5 mLs by mouth 4 (four) times  daily -  before meals and at bedtime for mouth sores.   loperamide HCl (IMODIUM) 1 MG/7.5ML suspension, Take 15 mLs (2 mg total) by mouth as needed for diarrhea or loose stools.   magic mouthwash w/lidocaine SOLN*, Swish and spit 5 mls by mouth before meals and at bedtime for mouth sores.   magnesium oxide (MAG-OX) 400 MG tablet, Take 1 tablet by mouth 2 (two) times daily.   potassium chloride SA (KLOR-CON M) 20 MEQ tablet, Take 20 mEq by mouth daily.   Vitamin D, Ergocalciferol, (DRISDOL) 1.25 MG (50000 UNIT) CAPS capsule, Take 50,000 Units by mouth once a week.  * These medications belong to multiple therapeutic classes and are listed under each applicable group. No current facility-administered medications for this visit.  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   Goals of care, counseling/discussion 04/19/2022   Goals of care, counseling/discussion 04/19/2022   Hypertension    Neuropathy    Shingles    Stage II carcinoma of breast, ER-, right (HCC) 04/19/2022   Allergies:  Allergies  Allergen Reactions   Levaquin [Levofloxacin] Nausea And Vomiting and  Other (See Comments)    dizziness     Surgical History:  She  has a past surgical history that includes Cholecystectomy; Breast surgery; Total mastectomy (Right, 03/22/2022); Axillary sentinel node biopsy (Right, 04/14/2022); IR IMAGING GUIDED PORT INSERTION (06/29/2022); and Axillary lymph node biopsy (Right, 08/23/2023). Family History:  Her family history includes Colon cancer in her father; Hypertension in her maternal grandfather, maternal grandmother, and mother.  REVIEW OF SYSTEMS  : All other systems reviewed and negative except where noted in the History of Present Illness.  PHYSICAL EXAM: BP 130/70   Pulse 64   Ht 5\' 6"  (1.676 m)   Wt 192 lb (87.1 kg)   BMI 30.99 kg/m  Physical Exam   MEASUREMENTS: Weight- one ninety something. GENERAL APPEARANCE: Well nourished, in no apparent distress. HEENT: No cervical lymphadenopathy, unremarkable thyroid, sclerae anicteric, conjunctiva pink. RESPIRATORY: Respiratory effort normal, breath sounds equal bilaterally without rales, rhonchi, or wheezing. Lungs clear to auscultation bilaterally. CARDIO: Regular rate and rhythm with no murmurs, rubs, or gallops, peripheral pulses intact. ABDOMEN: Soft, non-distended, active bowel sounds in all four quadrants, no tenderness to palpation, no rebound, no mass appreciated. RECTAL: Declines. MUSCULOSKELETAL: Full range of motion, normal gait, without edema. SKIN: Dry, intact without rashes or lesions. No jaundice. NEURO: Alert, oriented, no focal deficits. PSYCH: Cooperative, normal mood and affect. EXTREMITIES: Left leg with swelling and stasis dermatitis.      Doree Albee, PA-C 2:58 PM

## 2024-03-15 NOTE — Telephone Encounter (Signed)
 Anti-Coag Clearance letter faxed to her PCP to get clearance to hold the Eliquis 2 days prior to her colonoscopy on 03/28/2024 with Dr Leone Payor. Fax # [phone], phone # [phone].

## 2024-03-15 NOTE — Patient Instructions (Signed)
 You have been scheduled for a colonoscopy. Please follow written instructions given to you at your visit today.   If you use inhalers (even only as needed), please bring them with you on the day of your procedure.  You will be contaced by our office prior to your procedure for directions on holding your Eliquis.  If you do not hear from our office 1 week prior to your scheduled procedure, please call [phone] to discuss. Ask for PJ, CMA  I appreciate the opportunity to care for you. Quentin Mulling PA _______________________

## 2024-03-18 NOTE — Telephone Encounter (Signed)
 I received a fax from her PCP with approval to hold the Eliquis 2 days prior to her procedure. I informed Darris and she verbalized understanding. I will have the clearance scanned into epic.

## 2024-03-25 ENCOUNTER — Encounter: Payer: Self-pay | Admitting: Certified Registered Nurse Anesthetist

## 2024-03-25 ENCOUNTER — Telehealth: Payer: Self-pay | Admitting: Internal Medicine

## 2024-03-25 NOTE — Telephone Encounter (Signed)
 Patient called and stated she would like to know if she is needing to reschedule her appointment for a colonoscopy due to her having Allergy like symptoms, Cough, and mucus but she stated her mucus was clear/ white. Patient is requesting a call back. Please advise.

## 2024-03-25 NOTE — Telephone Encounter (Signed)
 I spoke with the pt and she describes typical allergies (pollen)- She has clear runny nose and "tickle" cough. She is afebrile and no yellow/green drainage or reports of chest congestion. She will keep appt as planned or call back if symptoms worsen

## 2024-03-28 ENCOUNTER — Encounter: Payer: Self-pay | Admitting: *Deleted

## 2024-03-28 ENCOUNTER — Ambulatory Visit (AMBULATORY_SURGERY_CENTER): Admitting: Internal Medicine

## 2024-03-28 ENCOUNTER — Encounter: Payer: Self-pay | Admitting: Internal Medicine

## 2024-03-28 VITALS — BP 211/71 | HR 68 | Temp 97.4°F | Resp 12 | Ht 66.0 in | Wt 192.0 lb

## 2024-03-28 DIAGNOSIS — R948 Abnormal results of function studies of other organs and systems: Secondary | ICD-10-CM | POA: Diagnosis not present

## 2024-03-28 DIAGNOSIS — R933 Abnormal findings on diagnostic imaging of other parts of digestive tract: Secondary | ICD-10-CM | POA: Diagnosis not present

## 2024-03-28 NOTE — Progress Notes (Signed)
 Pt's states no medical or surgical changes since previsit or office visit.

## 2024-03-28 NOTE — Progress Notes (Signed)
 Patient had her colonoscopy today to follow up on activity seen on PET. Colonoscopy was normal without any concerning lesion to correspond with this activity.   Patient has not had navigational needs for some time. She is not on active treatment and is being followed by routine imaging. Will discontinue navigation at this time, but be available to the patient as needed in the future.   Oncology Nurse Navigator Documentation     03/28/2024   10:30 AM  Oncology Nurse Navigator Flowsheets  Navigation Complete Date: 03/28/2024  Post Navigation: Continue to Follow Patient? No  Reason Not Navigating Patient: No Treatment, Observation Only  Navigator Location [facility]  Navigator Encounter Type Follow-up Appt  Patient Visit Type MedOnc  Barriers/Navigation Needs No Barriers At This Time  Interventions None Required  Acuity Level 1-No Barriers  Support Groups/Services Friends and Family  Time Spent with Patient 15

## 2024-03-28 NOTE — Progress Notes (Signed)
 History and Physical Interval Note:  03/28/2024 9:20 AM  Brandi Roberts  has presented today for endoscopic procedure(s), with the diagnosis of  Encounter Diagnosis  Name Primary?   Abnormal PET scan of colon Yes  .  The various methods of evaluation and treatment have been discussed with the patient and/or family. After consideration of risks, benefits and other options for treatment, the patient has consented to  the endoscopic procedure(s).   The patient's history has been reviewed, patient examined, no change in status, stable for endoscopic procedure(s).  I have reviewed the patient's chart and labs.  Questions were answered to the patient's satisfaction.     Iva Boop, MD, [facility]

## 2024-03-28 NOTE — Progress Notes (Signed)
 0928 BP 203/67, Labetalol given IV, MD update, vss

## 2024-03-28 NOTE — Progress Notes (Signed)
 Report given to PACU, vss

## 2024-03-28 NOTE — Op Note (Signed)
 [facility] Patient Name: Brandi Roberts Procedure Date: 03/28/2024 9:19 AM MRN: [id number] Endoscopist: Iva Boop , MD, [id number] Age: 68 Referring MD:  Date of Birth: April 24, 1956 Gender: Female Account #: [id number] Procedure:                Colonoscopy Indications:              Abnormal PET scan of the GI tract Medicines:                Monitored Anesthesia Care Procedure:                Pre-Anesthesia Assessment:                           - Prior to the procedure, a History and Physical                            was performed, and patient medications and                            allergies were reviewed. The patient's tolerance of                            previous anesthesia was also reviewed. The risks                            and benefits of the procedure and the sedation                            options and risks were discussed with the patient.                            All questions were answered, and informed consent                            was obtained. Prior Anticoagulants: The patient has                            taken no anticoagulant or antiplatelet agents. ASA                            Grade Assessment: II - A patient with mild systemic                            disease. After reviewing the risks and benefits,                            the patient was deemed in satisfactory condition to                            undergo the procedure.                           After obtaining informed consent, the colonoscope  was passed under direct vision. Throughout the                            procedure, the patient's blood pressure, pulse, and                            oxygen saturations were monitored continuously. The                            CF [id number] #[id number] was introduced through the anus                            and advanced to the the cecum, identified by                            appendiceal orifice and  ileocecal valve. The                            colonoscopy was performed without difficulty. The                            patient tolerated the procedure well. The quality                            of the bowel preparation was adequate. The                            ileocecal valve, appendiceal orifice, and rectum                            were photographed. The bowel preparation used was                            SUFLAVE via single dose instruction. Scope In: 9:29:20 AM Scope Out: 9:42:18 AM Scope Withdrawal Time: 0 hours 10 minutes 22 seconds  Total Procedure Duration: 0 hours 12 minutes 58 seconds  Findings:                 The perianal and digital rectal examinations were                            normal.                           The entire examined colon appeared normal on direct                            and retroflexion views. Complications:            No immediate complications. Estimated Blood Loss:     Estimated blood loss: none. Impression:               - The entire examined colon is normal on direct and  retroflexion views. No lesion in right colon that                            would correspond with abnormnal uptake seen on PET                            scan. Radiologist did think most likely physiologic.                           - No specimens collected. Recommendation:           - Patient has a contact number available for                            emergencies. The signs and symptoms of potential                            delayed complications were discussed with the                            patient. Return to normal activities tomorrow.                            Written discharge instructions were provided to the                            patient.                           - Resume previous diet.                           - Continue present medications.                           - No repeat colonoscopy due to current age (47                             years or older) and the absence of colonic polyps. Iva Boop, MD 03/28/2024 9:49:57 AM This report has been signed electronically.

## 2024-03-28 NOTE — Patient Instructions (Addendum)
 No polyps or cancer seen.  Nothing to correlate with the PET scan abnormality so that was a false positive test result. Radiologist thought unlikely to be lesion but needed this test to be sure.  I appreciate the opportunity to care for you. Iva Boop, MD, [facility]  -Continue present medications    YOU HAD AN ENDOSCOPIC PROCEDURE TODAY AT THE [facility]:   Refer to the procedure report that was given to you for any specific questions about what was found during the examination.  If the procedure report does not answer your questions, please call your gastroenterologist to clarify.  If you requested that your care partner not be given the details of your procedure findings, then the procedure report has been included in a sealed envelope for you to review at your convenience later.  YOU SHOULD EXPECT: Some feelings of bloating in the abdomen. Passage of more gas than usual.  Walking can help get rid of the air that was put into your GI tract during the procedure and reduce the bloating. If you had a lower endoscopy (such as a colonoscopy or flexible sigmoidoscopy) you may notice spotting of blood in your stool or on the toilet paper. If you underwent a bowel prep for your procedure, you may not have a normal bowel movement for a few days.  Please Note:  You might notice some irritation and congestion in your nose or some drainage.  This is from the oxygen used during your procedure.  There is no need for concern and it should clear up in a day or so.  SYMPTOMS TO REPORT IMMEDIATELY:  Following lower endoscopy (colonoscopy or flexible sigmoidoscopy):  Excessive amounts of blood in the stool  Significant tenderness or worsening of abdominal pains  Swelling of the abdomen that is new, acute  Fever of 100F or higher   For urgent or emergent issues, a gastroenterologist can be reached at any hour by calling [phone]. Do not use MyChart messaging for urgent concerns.     DIET:  We do recommend a small meal at first, but then you may proceed to your regular diet.  Drink plenty of fluids but you should avoid alcoholic beverages for 24 hours.  ACTIVITY:  You should plan to take it easy for the rest of today and you should NOT DRIVE or use heavy machinery until tomorrow (because of the sedation medicines used during the test).    FOLLOW UP: Our staff will call the number listed on your records the next business day following your procedure.  We will call around 7:15- 8:00 am to check on you and address any questions or concerns that you may have regarding the information given to you following your procedure. If we do not reach you, we will leave a message.     If any biopsies were taken you will be contacted by phone or by letter within the next 1-3 weeks.  Please call us at [phone] if you have not heard about the biopsies in 3 weeks.    SIGNATURES/CONFIDENTIALITY: You and/or your care partner have signed paperwork which will be entered into your electronic medical record.  These signatures attest to the fact that that the information above on your After Visit Summary has been reviewed and is understood.  Full responsibility of the confidentiality of this discharge information lies with you and/or your care-partner.

## 2024-03-29 ENCOUNTER — Telehealth: Payer: Self-pay

## 2024-03-29 NOTE — Telephone Encounter (Signed)
  Follow up Call-     03/28/2024    8:42 AM  Call back number  Post procedure Call Back phone  # [phone]  Permission to leave phone message Yes     Patient questions:  Do you have a fever, pain , or abdominal swelling? No. Pain Score  0 *  Have you tolerated food without any problems? Yes.    Have you been able to return to your normal activities? Yes.    Do you have any questions about your discharge instructions: Diet   No. Medications  No. Follow up visit  No.  Do you have questions or concerns about your Care? No.  Actions: * If pain score is 4 or above: No action needed, pain <4.

## 2024-04-16 ENCOUNTER — Other Ambulatory Visit: Payer: Self-pay | Admitting: Hematology & Oncology

## 2024-04-16 DIAGNOSIS — C50911 Malignant neoplasm of unspecified site of right female breast: Secondary | ICD-10-CM

## 2024-04-25 ENCOUNTER — Other Ambulatory Visit: Payer: Self-pay

## 2024-04-25 ENCOUNTER — Encounter: Payer: Self-pay | Admitting: *Deleted

## 2024-04-25 ENCOUNTER — Inpatient Hospital Stay: Admitting: Hematology & Oncology

## 2024-04-25 ENCOUNTER — Inpatient Hospital Stay

## 2024-04-25 ENCOUNTER — Inpatient Hospital Stay: Attending: Hematology & Oncology

## 2024-04-25 ENCOUNTER — Encounter: Payer: Self-pay | Admitting: Hematology & Oncology

## 2024-04-25 VITALS — BP 163/54 | HR 58 | Temp 97.9°F | Resp 18 | Ht 66.0 in | Wt 188.0 lb

## 2024-04-25 DIAGNOSIS — Z95828 Presence of other vascular implants and grafts: Secondary | ICD-10-CM

## 2024-04-25 DIAGNOSIS — D6481 Anemia due to antineoplastic chemotherapy: Secondary | ICD-10-CM | POA: Insufficient documentation

## 2024-04-25 DIAGNOSIS — D631 Anemia in chronic kidney disease: Secondary | ICD-10-CM

## 2024-04-25 DIAGNOSIS — Z171 Estrogen receptor negative status [ER-]: Secondary | ICD-10-CM | POA: Diagnosis not present

## 2024-04-25 DIAGNOSIS — C50911 Malignant neoplasm of unspecified site of right female breast: Secondary | ICD-10-CM

## 2024-04-25 LAB — IRON AND IRON BINDING CAPACITY (CC-WL,HP ONLY)
Iron: 65 ug/dL (ref 28–170)
Saturation Ratios: 19 % (ref 10.4–31.8)
TIBC: 351 ug/dL (ref 250–450)
UIBC: 286 ug/dL (ref 148–442)

## 2024-04-25 LAB — CMP (CANCER CENTER ONLY)
ALT: 10 U/L (ref 0–44)
AST: 15 U/L (ref 15–41)
Albumin: 4.3 g/dL (ref 3.5–5.0)
Alkaline Phosphatase: 68 U/L (ref 38–126)
Anion gap: 9 (ref 5–15)
BUN: 25 mg/dL — ABNORMAL HIGH (ref 8–23)
CO2: 24 mmol/L (ref 22–32)
Calcium: 9.4 mg/dL (ref 8.9–10.3)
Chloride: 103 mmol/L (ref 98–111)
Creatinine: 1.61 mg/dL — ABNORMAL HIGH (ref 0.44–1.00)
GFR, Estimated: 34 mL/min — ABNORMAL LOW (ref >60)
Glucose, Bld: 118 mg/dL — ABNORMAL HIGH (ref 70–99)
Potassium: 4.2 mmol/L (ref 3.5–5.1)
Sodium: 136 mmol/L (ref 135–145)
Total Bilirubin: 0.3 mg/dL (ref 0.0–1.2)
Total Protein: 7.5 g/dL (ref 6.5–8.1)

## 2024-04-25 LAB — CBC WITH DIFFERENTIAL (CANCER CENTER ONLY)
Abs Immature Granulocytes: 0.09 10*3/uL — ABNORMAL HIGH (ref 0.00–0.07)
Basophils Absolute: 0 10*3/uL (ref 0.0–0.1)
Basophils Relative: 1 %
Eosinophils Absolute: 0.1 10*3/uL (ref 0.0–0.5)
Eosinophils Relative: 1 %
HCT: 30.3 % — ABNORMAL LOW (ref 36.0–46.0)
Hemoglobin: 10 g/dL — ABNORMAL LOW (ref 12.0–15.0)
Immature Granulocytes: 1 %
Lymphocytes Relative: 22 %
Lymphs Abs: 1.9 10*3/uL (ref 0.7–4.0)
MCH: 32.1 pg (ref 26.0–34.0)
MCHC: 33 g/dL (ref 30.0–36.0)
MCV: 97.1 fL (ref 80.0–100.0)
Monocytes Absolute: 0.6 10*3/uL (ref 0.1–1.0)
Monocytes Relative: 7 %
Neutro Abs: 6.1 10*3/uL (ref 1.7–7.7)
Neutrophils Relative %: 68 %
Platelet Count: 214 10*3/uL (ref 150–400)
RBC: 3.12 MIL/uL — ABNORMAL LOW (ref 3.87–5.11)
RDW: 13.1 % (ref 11.5–15.5)
WBC Count: 8.8 10*3/uL (ref 4.0–10.5)
nRBC: 0 % (ref 0.0–0.2)

## 2024-04-25 LAB — RETICULOCYTES
Immature Retic Fract: 8.3 % (ref 2.3–15.9)
RBC.: 3.1 MIL/uL — ABNORMAL LOW (ref 3.87–5.11)
Retic Count, Absolute: 64.2 10*3/uL (ref 19.0–186.0)
Retic Ct Pct: 2.1 % (ref 0.4–3.1)

## 2024-04-25 LAB — FERRITIN: Ferritin: 234 ng/mL (ref 11–307)

## 2024-04-25 MED ORDER — DARBEPOETIN ALFA 300 MCG/0.6ML IJ SOSY
300.0000 ug | PREFILLED_SYRINGE | Freq: Once | INTRAMUSCULAR | Status: AC
  Administered 2024-04-25: 300 ug via SUBCUTANEOUS
  Filled 2024-04-25: qty 0.6

## 2024-04-25 MED ORDER — SODIUM CHLORIDE 0.9% FLUSH
10.0000 mL | INTRAVENOUS | Status: AC | PRN
Start: 2024-04-25 — End: ?
  Administered 2024-04-25: 10 mL via INTRAVENOUS

## 2024-04-25 MED ORDER — HEPARIN SOD (PORK) LOCK FLUSH 100 UNIT/ML IV SOLN
500.0000 [IU] | Freq: Once | INTRAVENOUS | Status: AC
  Administered 2024-04-25: 500 [IU] via INTRAVENOUS

## 2024-04-25 NOTE — Patient Instructions (Signed)

## 2024-04-25 NOTE — Progress Notes (Signed)
 [facility] Follow Up Visit  Brandi Roberts [id number] May 23, 1956 68 y.o. 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 is here today for follow-up.  She continues to do quite well.  Her hair is growing back quite nicely.  She is more active.  She has more energy.  She did have a colonoscopy in early April.  Thankfully, the colonoscopy did not show any evidence of any type of polyp or malignancy.  She had her last CA 27.29 in mid March 1930.  Again this is holding steady.  She has had no fever.  There is been no bleeding.  She has had no change in bowel or bladder habits.  She has had limited leg swelling.  This improved quite nicely when she stopped her Cardura.  She has had no problems with headache.  There is been no mouth sores.  Her last iron studies that were done back in March showed a ferritin of 168 with an iron saturation of 30%.  Overall, I would have to say that her performance status is probably ECOG 1.    Medications:  Allergies as of 04/25/2024       Reactions   Levofloxacin Nausea And Vomiting, Other (See Comments)   dizziness Other Reaction(s): Other (See Comments)    dizziness        Medication List        Accurate as of Apr 25, 2024 10:40 AM. If you have any questions, ask your nurse or doctor.          albuterol  108 (90 Base) MCG/ACT inhaler Commonly known as: VENTOLIN  HFA Inhale 2 puffs into the lungs every 6 (six) hours as needed.   ALPRAZolam  0.25 MG tablet Commonly known as:  XANAX  TAKE 1 TABLET BY MOUTH THREE TIMES A DAY AS NEEDED FOR ANXIETY   amLODipine 10 MG tablet Commonly known as: NORVASC Take 10 mg by mouth daily.   atorvastatin  10 MG tablet Commonly known as: LIPITOR Take 5 mg by mouth at bedtime.   benazepril  40 MG tablet Commonly known as: LOTENSIN  Take 40 mg by mouth daily.   diphenoxylate -atropine  2.5-0.025 MG tablet Commonly known as: LOMOTIL  Take 2 tablets by mouth 4 (four) times daily as needed for diarrhea or loose stools.   doxazosin 2 MG tablet Commonly known as: CARDURA Take 2 mg by mouth 2 (two) times daily.   Eliquis 5 MG Tabs tablet Generic drug: apixaban Take 5 mg by mouth 2 (two) times daily.   gabapentin  600 MG tablet Commonly known as: NEURONTIN  Take 600 mg by mouth at bedtime.   hydrALAZINE  50 MG tablet Commonly known as: APRESOLINE  Take 50 mg by mouth 2 (two) times daily.   loperamide  HCl 1 MG/7.5ML suspension Commonly known as: IMODIUM  Take 15 mLs (2 mg total) by mouth as needed for diarrhea or loose stools.   magic mouthwash w/lidocaine  Soln Swish and spit 5 mls by mouth before meals and at bedtime for mouth sores.   lidocaine -diphenhydrAMINE -alum & mag hydroxide-simeth-nystatin  Swish and spit 5 mLs by mouth 4 (four)  times daily -  before meals and at bedtime for mouth sores.   magnesium oxide 400 MG tablet Commonly known as: MAG-OX Take 1 tablet by mouth 2 (two) times daily.   metFORMIN 1000 MG tablet Commonly known as: GLUCOPHAGE Take 1,000 mg by mouth 2 (two) times daily.   metoprolol  succinate 100 MG 24 hr tablet Commonly known as: TOPROL -XL Take 100 mg by mouth daily.   oxyCODONE  5 MG immediate release tablet Commonly known as: Oxy IR/ROXICODONE  Take 1 tablet (5 mg total) by mouth every 6 (six) hours as needed for moderate pain, severe pain or breakthrough pain.   potassium chloride  SA 20 MEQ tablet Commonly known as: KLOR-CON  M Take 20 mEq by mouth daily.    triamterene -hydrochlorothiazide  75-50 MG tablet Commonly known as: MAXZIDE  Take 1 tablet by mouth daily.   Vitamin D (Ergocalciferol) 1.25 MG (50000 UNIT) Caps capsule Commonly known as: DRISDOL Take 50,000 Units by mouth once a week.        Allergies:  Allergies  Allergen Reactions   Levofloxacin Nausea And Vomiting and Other (See Comments)    dizziness  Other Reaction(s): Other (See Comments)    dizziness    Past Medical History, Surgical history, Social history, and Family History were reviewed and updated.  Review of Systems: Review of Systems  Constitutional: Negative.   HENT: Negative.    Eyes: Negative.   Respiratory: Negative.    Cardiovascular: Negative.   Gastrointestinal: Negative.   Genitourinary: Negative.   Musculoskeletal: Negative.   Skin: Negative.   Neurological: Negative.   Endo/Heme/Allergies: Negative.   Psychiatric/Behavioral: Negative.       Physical Exam:  height is 5\' 6"  (1.676 m) and weight is 188 lb (85.3 kg). Her oral temperature is 97.9 F (36.6 C). Her blood pressure is 163/54 (abnormal) and her pulse is 58 (abnormal). Her respiration is 18 and oxygen saturation is 100%.   Wt Readings from Last 3 Encounters:  04/25/24 188 lb (85.3 kg)  03/28/24 192 lb (87.1 kg)  03/15/24 192 lb (87.1 kg)    Physical Exam Vitals reviewed.  Constitutional:      Comments: Her breast exam shows left breast with no masses, edema or erythema.  There is no left axillary adenopathy.  Right chest wall shows healed mastectomy scar.  There is little bit of hyperpigmentation.  She has had no masses.  There is no erythema.  There is no right axillary adenopathy.  HENT:     Head: Normocephalic and atraumatic.  Eyes:     Pupils: Pupils are equal, round, and reactive to light.  Cardiovascular:     Rate and Rhythm: Normal rate and regular rhythm.     Heart sounds: Normal heart sounds.  Pulmonary:     Effort: Pulmonary effort is normal.     Breath sounds:  Normal breath sounds.  Abdominal:     General: Bowel sounds are normal.     Palpations: Abdomen is soft.  Musculoskeletal:        General: No tenderness or deformity. Normal range of motion.     Cervical back: Normal range of motion.  Lymphadenopathy:     Cervical: No cervical adenopathy.  Skin:    General: Skin is warm and dry.     Findings: No erythema or rash.  Neurological:     Mental Status: She is alert and oriented to person, place, and time.  Psychiatric:        Behavior: Behavior normal.  Thought Content: Thought content normal.        Judgment: Judgment normal.      Lab Results  Component Value Date   WBC 8.8 04/25/2024   HGB 10.0 (L) 04/25/2024   HCT 30.3 (L) 04/25/2024   MCV 97.1 04/25/2024   PLT 214 04/25/2024   Lab Results  Component Value Date   FERRITIN 168 03/14/2024   IRON 97 03/14/2024   TIBC 319 03/14/2024   UIBC 222 03/14/2024   IRONPCTSAT 30 03/14/2024   Lab Results  Component Value Date   RETICCTPCT 2.1 04/25/2024   RBC 3.12 (L) 04/25/2024   RBC 3.10 (L) 04/25/2024   No results found for: "KPAFRELGTCHN", "LAMBDASER", "KAPLAMBRATIO" No results found for: "IGGSERUM", "IGA", "IGMSERUM" No results found for: "TOTALPROTELP", "ALBUMINELP", "A1GS", "A2GS", "BETS", "BETA2SER", "GAMS", "MSPIKE", "SPEI"   Chemistry      Component Value Date/Time   NA 136 04/25/2024 0953   K 4.2 04/25/2024 0953   CL 103 04/25/2024 0953   CO2 24 04/25/2024 0953   BUN 25 (H) 04/25/2024 0953   CREATININE 1.61 (H) 04/25/2024 0953      Component Value Date/Time   CALCIUM  9.4 04/25/2024 0953   ALKPHOS 68 04/25/2024 0953   AST 15 04/25/2024 0953   ALT 10 04/25/2024 0953   BILITOT 0.3 04/25/2024 0953       Impression and Plan: Ms. Marlow Sin is a pleasant 68 yo African American female with invasive ductal carcinoma of the right breast, ER-/PR-/HER2 + treated with mastectomy.   She had 1 positive lymph node.  Unfortunately, she recurred quickly after  anti-HER2 therapy.  She has had 4 cycles of chemotherapy with anti-HER2 therapy.  Again, she has had an incredibly horrible time.  For right now, there is a looks fantastic.  There is nothing going on with her colon.  I am glad that she had the colonoscopy.  We will continue to follow her along.  I will go ahead and set up with another PET scan.  Will get this PET scan done probably in June.  I will then plan to see her back afterwards.  If all looks good, then we can probably get her through the entire Summer.  We will go ahead and give her Aranesp  today.  We will see what her iron levels look like.  I would like to see her hemoglobin little bit higher.  Ivor Mars, MD 5/1/202510:40 AM

## 2024-04-25 NOTE — Addendum Note (Signed)
 Addended by: Treavor Blomquist on: 04/25/2024 11:18 AM   Modules accepted: Orders

## 2024-04-26 LAB — CANCER ANTIGEN 27.29: CA 27.29: 33.5 U/mL (ref 0.0–38.6)

## 2024-05-24 ENCOUNTER — Other Ambulatory Visit: Payer: Self-pay | Admitting: Hematology & Oncology

## 2024-05-24 DIAGNOSIS — C50911 Malignant neoplasm of unspecified site of right female breast: Secondary | ICD-10-CM

## 2024-05-29 ENCOUNTER — Inpatient Hospital Stay: Admitting: Hematology & Oncology

## 2024-05-29 ENCOUNTER — Ambulatory Visit: Payer: Self-pay | Admitting: Hematology & Oncology

## 2024-05-29 ENCOUNTER — Other Ambulatory Visit: Payer: Self-pay

## 2024-05-29 ENCOUNTER — Inpatient Hospital Stay: Attending: Hematology & Oncology

## 2024-05-29 ENCOUNTER — Inpatient Hospital Stay

## 2024-05-29 VITALS — BP 150/73 | HR 59 | Temp 97.7°F | Resp 18 | Ht 66.0 in | Wt 191.0 lb

## 2024-05-29 DIAGNOSIS — I82409 Acute embolism and thrombosis of unspecified deep veins of unspecified lower extremity: Secondary | ICD-10-CM

## 2024-05-29 DIAGNOSIS — Z9011 Acquired absence of right breast and nipple: Secondary | ICD-10-CM | POA: Diagnosis not present

## 2024-05-29 DIAGNOSIS — Z171 Estrogen receptor negative status [ER-]: Secondary | ICD-10-CM

## 2024-05-29 DIAGNOSIS — Z79899 Other long term (current) drug therapy: Secondary | ICD-10-CM | POA: Insufficient documentation

## 2024-05-29 DIAGNOSIS — C50911 Malignant neoplasm of unspecified site of right female breast: Secondary | ICD-10-CM

## 2024-05-29 DIAGNOSIS — D631 Anemia in chronic kidney disease: Secondary | ICD-10-CM | POA: Diagnosis not present

## 2024-05-29 DIAGNOSIS — Z452 Encounter for adjustment and management of vascular access device: Secondary | ICD-10-CM | POA: Diagnosis not present

## 2024-05-29 DIAGNOSIS — D6481 Anemia due to antineoplastic chemotherapy: Secondary | ICD-10-CM | POA: Insufficient documentation

## 2024-05-29 DIAGNOSIS — Z9221 Personal history of antineoplastic chemotherapy: Secondary | ICD-10-CM | POA: Insufficient documentation

## 2024-05-29 LAB — CBC WITH DIFFERENTIAL (CANCER CENTER ONLY)
Abs Immature Granulocytes: 0.02 10*3/uL (ref 0.00–0.07)
Basophils Absolute: 0 10*3/uL (ref 0.0–0.1)
Basophils Relative: 0 %
Eosinophils Absolute: 0.1 10*3/uL (ref 0.0–0.5)
Eosinophils Relative: 1 %
HCT: 33.4 % — ABNORMAL LOW (ref 36.0–46.0)
Hemoglobin: 11.2 g/dL — ABNORMAL LOW (ref 12.0–15.0)
Immature Granulocytes: 0 %
Lymphocytes Relative: 23 %
Lymphs Abs: 1.7 10*3/uL (ref 0.7–4.0)
MCH: 32.1 pg (ref 26.0–34.0)
MCHC: 33.5 g/dL (ref 30.0–36.0)
MCV: 95.7 fL (ref 80.0–100.0)
Monocytes Absolute: 0.7 10*3/uL (ref 0.1–1.0)
Monocytes Relative: 9 %
Neutro Abs: 4.8 10*3/uL (ref 1.7–7.7)
Neutrophils Relative %: 67 %
Platelet Count: 183 10*3/uL (ref 150–400)
RBC: 3.49 MIL/uL — ABNORMAL LOW (ref 3.87–5.11)
RDW: 12.8 % (ref 11.5–15.5)
WBC Count: 7.3 10*3/uL (ref 4.0–10.5)
nRBC: 0 % (ref 0.0–0.2)

## 2024-05-29 LAB — RETICULOCYTES
Immature Retic Fract: 6.6 % (ref 2.3–15.9)
RBC.: 3.5 MIL/uL — ABNORMAL LOW (ref 3.87–5.11)
Retic Count, Absolute: 50.1 10*3/uL (ref 19.0–186.0)
Retic Ct Pct: 1.4 % (ref 0.4–3.1)

## 2024-05-29 LAB — IRON AND IRON BINDING CAPACITY (CC-WL,HP ONLY)
Iron: 82 ug/dL (ref 28–170)
Saturation Ratios: 24 % (ref 10.4–31.8)
TIBC: 336 ug/dL (ref 250–450)
UIBC: 254 ug/dL (ref 148–442)

## 2024-05-29 LAB — FERRITIN: Ferritin: 204 ng/mL (ref 11–307)

## 2024-05-29 MED ORDER — SODIUM CHLORIDE 0.9% FLUSH
10.0000 mL | INTRAVENOUS | Status: DC | PRN
  Administered 2024-05-29: 10 mL via INTRAVENOUS

## 2024-05-29 MED ORDER — HEPARIN SOD (PORK) LOCK FLUSH 100 UNIT/ML IV SOLN
500.0000 [IU] | Freq: Once | INTRAVENOUS | Status: AC
  Administered 2024-05-29: 500 [IU] via INTRAVENOUS

## 2024-05-29 NOTE — Patient Instructions (Signed)

## 2024-05-29 NOTE — Progress Notes (Signed)
 [facility] Hematology and Oncology Follow Up Visit  Brandi Roberts [id number] July 25, 1956 68 y.o. 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 is here today for follow-up.  She comes in a little bit early.  She did have some discomfort on the right chest wall in the right upper arm.  She has been having a stinging type pain.  It sounds like this might be neuropathic type pain.  She has not noticed any swelling.  Is been no redness.  She has had no fever.  Otherwise, she been doing pretty well.  She has done very well with the Aranesp .  Her hemoglobin has come up quite nicely.  She does not need any Aranesp  today.  Her last iron studies that were done in early May showed a ferritin of 234 with an iron saturation of 19%.  Her last CA 27.29 back in early May was 33.5.  This is holding steady.  She has had no rashes.  There is been no change in bowel or bladder habits.  She has had no cough or shortness of breath.  She is due for a PET scan on 06/10/2024.  Overall, I would have to say that her performance status is probably ECOG 1.  Medications:  Allergies as of 05/29/2024       Reactions   Levofloxacin Nausea And Vomiting, Other (See Comments)   dizziness Other Reaction(s): Other (See Comments)    dizziness        Medication List        Accurate as of May 29, 2024  2:01 PM. If you have any questions, ask your nurse or doctor.          albuterol  108 (90 Base) MCG/ACT inhaler Commonly known as: VENTOLIN  HFA Inhale 2 puffs into the  lungs every 6 (six) hours as needed.   ALPRAZolam  0.25 MG tablet Commonly known as: XANAX  TAKE 1 TABLET BY MOUTH THREE TIMES A DAY AS NEEDED FOR ANXIETY   amLODipine 10 MG tablet Commonly known as: NORVASC Take 10 mg by mouth daily.   atorvastatin  10 MG tablet Commonly known as: LIPITOR Take 5 mg by mouth at bedtime.   benazepril  40 MG tablet Commonly known as: LOTENSIN  Take 40 mg by mouth daily.   diphenoxylate -atropine  2.5-0.025 MG tablet Commonly known as: LOMOTIL  Take 2 tablets by mouth 4 (four) times daily as needed for diarrhea or loose stools.   doxazosin 2 MG tablet Commonly known as: CARDURA Take 1 mg by mouth daily.   Eliquis 5 MG Tabs tablet Generic drug: apixaban Take 5 mg by mouth 2 (two) times daily.   gabapentin  600 MG tablet Commonly known as: NEURONTIN  Take 600 mg by mouth at bedtime.   hydrALAZINE  50 MG tablet Commonly known as: APRESOLINE  Take 50 mg by mouth 2 (two) times daily.   loperamide  HCl 1 MG/7.5ML suspension Commonly known as: IMODIUM  Take 15 mLs (2 mg total) by mouth as needed for diarrhea or loose stools.   magic mouthwash w/lidocaine  Soln Swish and spit 5 mls by mouth before meals and  at bedtime for mouth sores.   lidocaine -diphenhydrAMINE -alum & mag hydroxide-simeth-nystatin  Swish and spit 5 mLs by mouth 4 (four) times daily -  before meals and at bedtime for mouth sores.   magnesium oxide 400 MG tablet Commonly known as: MAG-OX Take 1 tablet by mouth 2 (two) times daily.   metFORMIN 1000 MG tablet Commonly known as: GLUCOPHAGE Take 1,000 mg by mouth 2 (two) times daily.   metoprolol  succinate 100 MG 24 hr tablet Commonly known as: TOPROL -XL Take 100 mg by mouth daily.   potassium chloride  SA 20 MEQ tablet Commonly known as: KLOR-CON  M Take 20 mEq by mouth daily.   triamterene -hydrochlorothiazide  75-50 MG tablet Commonly known as: MAXZIDE  Take 1 tablet by mouth daily.   Vitamin D (Ergocalciferol) 1.25 MG (50000 UNIT)  Caps capsule Commonly known as: DRISDOL Take 50,000 Units by mouth once a week.        Allergies:  Allergies  Allergen Reactions   Levofloxacin Nausea And Vomiting and Other (See Comments)    dizziness  Other Reaction(s): Other (See Comments)    dizziness    Past Medical History, Surgical history, Social history, and Family History were reviewed and updated.  Review of Systems: Review of Systems  Constitutional: Negative.   HENT: Negative.    Eyes: Negative.   Respiratory: Negative.    Cardiovascular: Negative.   Gastrointestinal: Negative.   Genitourinary: Negative.   Musculoskeletal: Negative.   Skin: Negative.   Neurological: Negative.   Endo/Heme/Allergies: Negative.   Psychiatric/Behavioral: Negative.       Physical Exam:  height is 5\' 6"  (1.676 m) and weight is 191 lb (86.6 kg). Her oral temperature is 97.7 F (36.5 C). Her blood pressure is 150/73 (abnormal) and her pulse is 59 (abnormal). Her respiration is 18 and oxygen saturation is 100%.   Wt Readings from Last 3 Encounters:  05/29/24 191 lb (86.6 kg)  04/25/24 188 lb (85.3 kg)  03/28/24 192 lb (87.1 kg)    Physical Exam Vitals reviewed.  Constitutional:      Comments: Her breast exam shows left breast with no masses, edema or erythema.  There is no left axillary adenopathy.  Right chest wall shows healed mastectomy scar.  There is little bit of hyperpigmentation.  She has had no masses.  There is no erythema.  There is no right axillary adenopathy.  HENT:     Head: Normocephalic and atraumatic.  Eyes:     Pupils: Pupils are equal, round, and reactive to light.  Cardiovascular:     Rate and Rhythm: Normal rate and regular rhythm.     Heart sounds: Normal heart sounds.  Pulmonary:     Effort: Pulmonary effort is normal.     Breath sounds: Normal breath sounds.  Abdominal:     General: Bowel sounds are normal.     Palpations: Abdomen is soft.  Musculoskeletal:        General: No tenderness or  deformity. Normal range of motion.     Cervical back: Normal range of motion.  Lymphadenopathy:     Cervical: No cervical adenopathy.  Skin:    General: Skin is warm and dry.     Findings: No erythema or rash.  Neurological:     Mental Status: She is alert and oriented to person, place, and time.  Psychiatric:        Behavior: Behavior normal.        Thought Content: Thought content normal.        Judgment: Judgment  normal.      Lab Results  Component Value Date   WBC 7.3 05/29/2024   HGB 11.2 (L) 05/29/2024   HCT 33.4 (L) 05/29/2024   MCV 95.7 05/29/2024   PLT 183 05/29/2024   Lab Results  Component Value Date   FERRITIN 234 04/25/2024   IRON 65 04/25/2024   TIBC 351 04/25/2024   UIBC 286 04/25/2024   IRONPCTSAT 19 04/25/2024   Lab Results  Component Value Date   RETICCTPCT 2.1 04/25/2024   RBC 3.49 (L) 05/29/2024   No results found for: "KPAFRELGTCHN", "LAMBDASER", "KAPLAMBRATIO" No results found for: "IGGSERUM", "IGA", "IGMSERUM" No results found for: "TOTALPROTELP", "ALBUMINELP", "A1GS", "A2GS", "BETS", "BETA2SER", "GAMS", "MSPIKE", "SPEI"   Chemistry      Component Value Date/Time   NA 136 04/25/2024 0953   K 4.2 04/25/2024 0953   CL 103 04/25/2024 0953   CO2 24 04/25/2024 0953   BUN 25 (H) 04/25/2024 0953   CREATININE 1.61 (H) 04/25/2024 0953      Component Value Date/Time   CALCIUM  9.4 04/25/2024 0953   ALKPHOS 68 04/25/2024 0953   AST 15 04/25/2024 0953   ALT 10 04/25/2024 0953   BILITOT 0.3 04/25/2024 0953       Impression and Plan: Ms. Marlow Sin is a pleasant 68 yo African American female with invasive ductal carcinoma of the right breast, ER-/PR-/HER2 + treated with mastectomy.   She had 1 positive lymph node.  Unfortunately, she recurred quickly after anti-HER2 therapy.  She has had 4 cycles of chemotherapy with anti-HER2 therapy.  Again, she has had an incredibly horrible time.  I do not see any obvious recurrent disease.  Again we will  see what the PET scan shows.  I realize that she is at risk for recurrence..  I told her that she could increase the frequency of her gabapentin .  I told her to take 300 mg twice a day-once in the morning and once in the afternoon, and then do 600 mg at bedtime.  We will see if this may help this pain.  Again, it sounds like this is neuropathic type pain.  She has an appointment to be seen for her regular appointment on 06/24/2024.  We will keep that appointment.  Ivor Mars, MD 6/4/20252:01 PM

## 2024-05-30 ENCOUNTER — Encounter: Payer: Self-pay | Admitting: *Deleted

## 2024-05-30 ENCOUNTER — Encounter: Payer: Self-pay | Admitting: Hematology & Oncology

## 2024-05-30 LAB — CANCER ANTIGEN 27.29: CA 27.29: 27.7 U/mL (ref 0.0–38.6)

## 2024-05-30 NOTE — Telephone Encounter (Signed)
-----   Message from Ivor Mars sent at 05/30/2024  9:09 AM EDT ----- Please call and let her know that the tumor marker went from 34 down to 28.  Great news.  Great job.  Twilla Galea

## 2024-05-30 NOTE — Telephone Encounter (Signed)
 Advised via MyChart.

## 2024-05-31 LAB — COLD AGGLUTININ TITER: Cold Agglutinin Titer: NEGATIVE

## 2024-06-05 ENCOUNTER — Other Ambulatory Visit: Payer: Self-pay | Admitting: Hematology & Oncology

## 2024-06-05 DIAGNOSIS — B372 Candidiasis of skin and nail: Secondary | ICD-10-CM

## 2024-06-10 ENCOUNTER — Encounter (HOSPITAL_COMMUNITY)
Admission: RE | Admit: 2024-06-10 | Discharge: 2024-06-10 | Disposition: A | Discharge status: Home / Self Care | Source: Ambulatory Visit | Attending: Hematology & Oncology | Admitting: Hematology & Oncology

## 2024-06-10 DIAGNOSIS — C50911 Malignant neoplasm of unspecified site of right female breast: Secondary | ICD-10-CM | POA: Insufficient documentation

## 2024-06-10 DIAGNOSIS — Z171 Estrogen receptor negative status [ER-]: Secondary | ICD-10-CM | POA: Insufficient documentation

## 2024-06-10 LAB — GLUCOSE, CAPILLARY: Glucose-Capillary: 90 mg/dL (ref 70–99)

## 2024-06-10 MED ORDER — FLUDEOXYGLUCOSE F - 18 (FDG) INJECTION
9.5500 | Freq: Once | INTRAVENOUS | Status: AC
  Administered 2024-06-10: 9.48 via INTRAVENOUS

## 2024-06-24 ENCOUNTER — Inpatient Hospital Stay

## 2024-06-24 ENCOUNTER — Inpatient Hospital Stay (HOSPITAL_BASED_OUTPATIENT_CLINIC_OR_DEPARTMENT_OTHER): Admitting: Hematology & Oncology

## 2024-06-24 ENCOUNTER — Other Ambulatory Visit: Payer: Self-pay

## 2024-06-24 ENCOUNTER — Ambulatory Visit: Payer: Self-pay | Admitting: Hematology & Oncology

## 2024-06-24 VITALS — BP 152/56 | HR 64 | Temp 97.9°F | Resp 17 | Ht 66.0 in | Wt 195.0 lb

## 2024-06-24 DIAGNOSIS — D631 Anemia in chronic kidney disease: Secondary | ICD-10-CM

## 2024-06-24 DIAGNOSIS — C50911 Malignant neoplasm of unspecified site of right female breast: Secondary | ICD-10-CM | POA: Diagnosis not present

## 2024-06-24 DIAGNOSIS — Z171 Estrogen receptor negative status [ER-]: Secondary | ICD-10-CM | POA: Diagnosis not present

## 2024-06-24 DIAGNOSIS — Z95828 Presence of other vascular implants and grafts: Secondary | ICD-10-CM

## 2024-06-24 DIAGNOSIS — I82409 Acute embolism and thrombosis of unspecified deep veins of unspecified lower extremity: Secondary | ICD-10-CM

## 2024-06-24 DIAGNOSIS — D6481 Anemia due to antineoplastic chemotherapy: Secondary | ICD-10-CM | POA: Diagnosis not present

## 2024-06-24 LAB — CBC WITH DIFFERENTIAL (CANCER CENTER ONLY)
Abs Immature Granulocytes: 0.04 10*3/uL (ref 0.00–0.07)
Basophils Absolute: 0 10*3/uL (ref 0.0–0.1)
Basophils Relative: 0 %
Eosinophils Absolute: 0.1 10*3/uL (ref 0.0–0.5)
Eosinophils Relative: 1 %
HCT: 32 % — ABNORMAL LOW (ref 36.0–46.0)
Hemoglobin: 10.8 g/dL — ABNORMAL LOW (ref 12.0–15.0)
Immature Granulocytes: 1 %
Lymphocytes Relative: 19 %
Lymphs Abs: 1.6 10*3/uL (ref 0.7–4.0)
MCH: 31.9 pg (ref 26.0–34.0)
MCHC: 33.8 g/dL (ref 30.0–36.0)
MCV: 94.4 fL (ref 80.0–100.0)
Monocytes Absolute: 0.7 10*3/uL (ref 0.1–1.0)
Monocytes Relative: 8 %
Neutro Abs: 6 10*3/uL (ref 1.7–7.7)
Neutrophils Relative %: 71 %
Platelet Count: 199 10*3/uL (ref 150–400)
RBC: 3.39 MIL/uL — ABNORMAL LOW (ref 3.87–5.11)
RDW: 12.4 % (ref 11.5–15.5)
WBC Count: 8.5 10*3/uL (ref 4.0–10.5)
nRBC: 0 % (ref 0.0–0.2)

## 2024-06-24 LAB — IRON AND IRON BINDING CAPACITY (CC-WL,HP ONLY)
Iron: 89 ug/dL (ref 28–170)
Saturation Ratios: 28 % (ref 10.4–31.8)
TIBC: 321 ug/dL (ref 250–450)
UIBC: 232 ug/dL (ref 148–442)

## 2024-06-24 LAB — CMP (CANCER CENTER ONLY)
ALT: 10 U/L (ref 0–44)
AST: 15 U/L (ref 15–41)
Albumin: 4 g/dL (ref 3.5–5.0)
Alkaline Phosphatase: 68 U/L (ref 38–126)
Anion gap: 7 (ref 5–15)
BUN: 26 mg/dL — ABNORMAL HIGH (ref 8–23)
CO2: 26 mmol/L (ref 22–32)
Calcium: 9.5 mg/dL (ref 8.9–10.3)
Chloride: 100 mmol/L (ref 98–111)
Creatinine: 1.46 mg/dL — ABNORMAL HIGH (ref 0.44–1.00)
GFR, Estimated: 38 mL/min — ABNORMAL LOW (ref >60)
Glucose, Bld: 99 mg/dL (ref 70–99)
Potassium: 4.2 mmol/L (ref 3.5–5.1)
Sodium: 133 mmol/L — ABNORMAL LOW (ref 135–145)
Total Bilirubin: 0.3 mg/dL (ref 0.0–1.2)
Total Protein: 7.1 g/dL (ref 6.5–8.1)

## 2024-06-24 LAB — FERRITIN: Ferritin: 194 ng/mL (ref 11–307)

## 2024-06-24 MED ORDER — SODIUM CHLORIDE 0.9% FLUSH
10.0000 mL | INTRAVENOUS | Status: DC | PRN
  Administered 2024-06-24: 10 mL via INTRAVENOUS

## 2024-06-24 MED ORDER — DARBEPOETIN ALFA 300 MCG/0.6ML IJ SOSY
300.0000 ug | PREFILLED_SYRINGE | Freq: Once | INTRAMUSCULAR | Status: AC
  Administered 2024-06-24: 300 ug via SUBCUTANEOUS
  Filled 2024-06-24: qty 0.6

## 2024-06-24 MED ORDER — HEPARIN SOD (PORK) LOCK FLUSH 100 UNIT/ML IV SOLN
500.0000 [IU] | Freq: Once | INTRAVENOUS | Status: AC
  Administered 2024-06-24: 500 [IU] via INTRAVENOUS

## 2024-06-24 NOTE — Patient Instructions (Signed)

## 2024-06-24 NOTE — Progress Notes (Signed)
 [facility] Hematology [facility] Oncology Follow Up Visit  Brandi Roberts [id number] 1956/03/13 68 y.o. 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  She looks quite good.  We last saw her back in early June.  We did do a PET scan on her.  This was done about a week ago.  Thankfully, the PET scan did not show any evidence of recurrent breast cancer.  Her last CA 27.29 was holding steady at 27.7.  Her appetite is doing well.  She is having no problems with nausea or vomiting.  There has been no change in bowel or bladder habits.  She has had no bleeding.  Her last iron studies that were done showed a ferritin of 204 with an iron saturation of 24%..  She does get Aranesp .  We will going give her a dose of Aranesp  today.  She has had no fever.  There has been no mouth sores.  She has had a little bit of leg swelling on occasion.  Overall, her performance status is ECOG 1.    Medications:  Allergies as of 06/24/2024       Reactions   Levofloxacin Nausea And Vomiting, Other (See Comments)   dizziness Other Reaction(s): Other (See Comments)    dizziness        Medication List        Accurate as of June 24, 2024 11:36 AM. If you have any questions, ask your nurse or doctor.          albuterol  108 (90 Base) MCG/ACT inhaler Commonly known as: VENTOLIN  HFA Inhale 2 puffs into the lungs every 6 (six) hours as needed.   ALPRAZolam  0.25 MG tablet Commonly known as: XANAX  TAKE 1 TABLET BY MOUTH THREE TIMES A DAY AS NEEDED FOR  ANXIETY   amLODipine 10 MG tablet Commonly known as: NORVASC Take 10 mg by mouth daily.   atorvastatin  10 MG tablet Commonly known as: LIPITOR Take 5 mg by mouth at bedtime.   benazepril  40 MG tablet Commonly known as: LOTENSIN  Take 40 mg by mouth daily.   diphenoxylate -atropine  2.5-0.025 MG tablet Commonly known as: LOMOTIL  Take 2 tablets by mouth 4 (four) times daily as needed for diarrhea or loose stools.   doxazosin 2 MG tablet Commonly known as: CARDURA Take 1 mg by mouth daily.   Eliquis 5 MG Tabs tablet Generic drug: apixaban Take 5 mg by mouth 2 (two) times daily.   fluconazole  200 MG tablet Commonly known as: DIFLUCAN  TAKE 1 TABLET BY MOUTH EVERY DAY   gabapentin  600 MG tablet Commonly known as: NEURONTIN  Take 600 mg by mouth at bedtime.   hydrALAZINE  50 MG tablet Commonly known as: APRESOLINE  Take 50 mg by mouth 2 (two) times daily.   loperamide  HCl 1 MG/7.5ML suspension Commonly known as: IMODIUM  Take 15 mLs (2 mg total) by mouth as needed for diarrhea or loose stools.   magic mouthwash w/lidocaine  Soln Swish and spit 5 mls by mouth before meals and at bedtime for mouth sores.   lidocaine -diphenhydrAMINE -alum & mag hydroxide-simeth-nystatin   Swish and spit 5 mLs by mouth 4 (four) times daily -  before meals and at bedtime for mouth sores.   magnesium oxide 400 MG tablet Commonly known as: MAG-OX Take 1 tablet by mouth 2 (two) times daily.   metFORMIN 1000 MG tablet Commonly known as: GLUCOPHAGE Take 1,000 mg by mouth 2 (two) times daily.   metoprolol  succinate 100 MG 24 hr tablet Commonly known as: TOPROL -XL Take 100 mg by mouth daily.   potassium chloride  SA 20 MEQ tablet Commonly known as: KLOR-CON  M Take 20 mEq by mouth daily.   triamterene -hydrochlorothiazide  75-50 MG tablet Commonly known as: MAXZIDE  Take 1 tablet by mouth daily.   Vitamin D (Ergocalciferol) 1.25 MG (50000 UNIT) Caps capsule Commonly known as: DRISDOL Take 50,000  Units by mouth once a week.        Allergies:  Allergies  Allergen Reactions   Levofloxacin Nausea And Vomiting and Other (See Comments)    dizziness  Other Reaction(s): Other (See Comments)    dizziness    Past Medical History, Surgical history, Social history, and Family History were reviewed and updated.  Review of Systems: Review of Systems  Constitutional: Negative.   HENT: Negative.    Eyes: Negative.   Respiratory: Negative.    Cardiovascular: Negative.   Gastrointestinal: Negative.   Genitourinary: Negative.   Musculoskeletal: Negative.   Skin: Negative.   Neurological: Negative.   Endo/Heme/Allergies: Negative.   Psychiatric/Behavioral: Negative.       Physical Exam: Her vital signs show temperature of 97.7.  Pulse 59.  Blood pressure 152/56.  Weight is 195 pounds.  Wt Readings from Last 3 Encounters:  06/24/24 195 lb (88.5 kg)  05/29/24 191 lb (86.6 kg)  04/25/24 188 lb (85.3 kg)    Physical Exam Vitals reviewed.  Constitutional:      Comments: Her breast exam shows left breast with no masses, edema or erythema.  There is no left axillary adenopathy.  Right chest wall shows healed mastectomy scar.  There is little bit of hyperpigmentation.  She has had no masses.  There is no erythema.  There is no right axillary adenopathy.  HENT:     Head: Normocephalic and atraumatic.   Eyes:     Pupils: Pupils are equal, round, and reactive to light.    Cardiovascular:     Rate and Rhythm: Normal rate and regular rhythm.     Heart sounds: Normal heart sounds.  Pulmonary:     Effort: Pulmonary effort is normal.     Breath sounds: Normal breath sounds.  Abdominal:     General: Bowel sounds are normal.     Palpations: Abdomen is soft.   Musculoskeletal:        General: No tenderness or deformity. Normal range of motion.     Cervical back: Normal range of motion.  Lymphadenopathy:     Cervical: No cervical adenopathy.   Skin:    General: Skin is warm  and dry.     Findings: No erythema or rash.   Neurological:     Mental Status: She is alert and oriented to person, place, and time.   Psychiatric:        Behavior: Behavior normal.        Thought Content: Thought content normal.        Judgment: Judgment normal.      Lab Results  Component Value Date   WBC 8.5 06/24/2024   HGB 10.8 (L) 06/24/2024   HCT 32.0 (L) 06/24/2024  MCV 94.4 06/24/2024   PLT 199 06/24/2024   Lab Results  Component Value Date   FERRITIN 204 05/29/2024   IRON 82 05/29/2024   TIBC 336 05/29/2024   UIBC 254 05/29/2024   IRONPCTSAT 24 05/29/2024   Lab Results  Component Value Date   RETICCTPCT 1.4 05/29/2024   RBC 3.39 (L) 06/24/2024   No results found for: KPAFRELGTCHN, LAMBDASER, KAPLAMBRATIO No results found for: IGGSERUM, IGA, IGMSERUM No results found for: STEPHANY CARLOTA BENSON MARKEL EARLA JOANNIE, GAMS, MSPIKE, SPEI   Chemistry      Component Value Date/Time   NA 133 (L) 06/24/2024 1050   K 4.2 06/24/2024 1050   CL 100 06/24/2024 1050   CO2 26 06/24/2024 1050   BUN 26 (H) 06/24/2024 1050   CREATININE 1.46 (H) 06/24/2024 1050      Component Value Date/Time   CALCIUM  9.5 06/24/2024 1050   ALKPHOS 68 06/24/2024 1050   AST 15 06/24/2024 1050   ALT 10 06/24/2024 1050   BILITOT 0.3 06/24/2024 1050       Impression and Plan: Brandi Roberts is a pleasant 68 yo African American female with invasive ductal carcinoma of the right breast, ER-/PR-/HER2 +.  She ultimately underwent mastectomy treated with mastectomy.   She had 1 positive lymph node.  Unfortunately, she recurred quickly after anti-HER2 therapy.  She has had 4 cycles of chemotherapy with carboplatinum and Taxotere .  She really had a tough time with this.  We also had her on anti-HER2 therapy.    I am glad that the PET scan does not show any evidence of recurrence.  I know that we still have to watch closely.  We will go ahead and give  her Aranesp  today.  We will see what her iron studies look like.  Hopefully, we can now move her appointments out a little bit longer.  I will plan to see her back in about 6 weeks or so.  She still has her Port-A-Cath and so we have to make sure we flush this.   Maude JONELLE Crease, MD 6/30/202511:36 AM

## 2024-06-25 LAB — CANCER ANTIGEN 27.29: CA 27.29: 30.6 U/mL (ref 0.0–38.6)

## 2024-07-04 ENCOUNTER — Other Ambulatory Visit: Payer: Self-pay | Admitting: Hematology & Oncology

## 2024-07-04 DIAGNOSIS — C50911 Malignant neoplasm of unspecified site of right female breast: Secondary | ICD-10-CM

## 2024-07-06 ENCOUNTER — Other Ambulatory Visit: Payer: Self-pay | Admitting: Hematology & Oncology

## 2024-07-06 DIAGNOSIS — B372 Candidiasis of skin and nail: Secondary | ICD-10-CM

## 2024-07-08 ENCOUNTER — Encounter: Payer: Self-pay | Admitting: Hematology & Oncology

## 2024-07-31 ENCOUNTER — Emergency Department (HOSPITAL_COMMUNITY)

## 2024-07-31 ENCOUNTER — Emergency Department (HOSPITAL_COMMUNITY)
Admission: EM | Admit: 2024-07-31 | Discharge: 2024-07-31 | Disposition: A | Discharge status: Home / Self Care | Attending: Emergency Medicine | Admitting: Emergency Medicine

## 2024-07-31 ENCOUNTER — Encounter (HOSPITAL_COMMUNITY): Payer: Self-pay | Admitting: Emergency Medicine

## 2024-07-31 ENCOUNTER — Other Ambulatory Visit: Payer: Self-pay

## 2024-07-31 DIAGNOSIS — Z7984 Long term (current) use of oral hypoglycemic drugs: Secondary | ICD-10-CM | POA: Insufficient documentation

## 2024-07-31 DIAGNOSIS — W01198A Fall on same level from slipping, tripping and stumbling with subsequent striking against other object, initial encounter: Secondary | ICD-10-CM | POA: Diagnosis not present

## 2024-07-31 DIAGNOSIS — Z7901 Long term (current) use of anticoagulants: Secondary | ICD-10-CM | POA: Diagnosis not present

## 2024-07-31 DIAGNOSIS — R0781 Pleurodynia: Secondary | ICD-10-CM | POA: Diagnosis not present

## 2024-07-31 DIAGNOSIS — R519 Headache, unspecified: Secondary | ICD-10-CM | POA: Insufficient documentation

## 2024-07-31 DIAGNOSIS — M25511 Pain in right shoulder: Secondary | ICD-10-CM | POA: Insufficient documentation

## 2024-07-31 DIAGNOSIS — W19XXXA Unspecified fall, initial encounter: Secondary | ICD-10-CM

## 2024-07-31 DIAGNOSIS — E119 Type 2 diabetes mellitus without complications: Secondary | ICD-10-CM | POA: Insufficient documentation

## 2024-07-31 MED ORDER — IBUPROFEN 600 MG PO TABS: 600.0000 mg | ORAL_TABLET | Freq: Four times a day (QID) | ORAL | 0 refills | Status: DC | PRN

## 2024-07-31 MED ORDER — KETOROLAC TROMETHAMINE 60 MG/2ML IM SOLN
30.0000 mg | Freq: Once | INTRAMUSCULAR | Status: AC
Start: 2024-07-31 — End: 2024-07-31
  Administered 2024-07-31: 30 mg via INTRAMUSCULAR
  Filled 2024-07-31: qty 2

## 2024-07-31 NOTE — ED Triage Notes (Signed)
 Pt ambulatory to triage and reports last Saturday that she tripped over a cord and landed on right side on wooden floor. Pain to right side of back and right shoulder. Also reports HA. Pt on eliquis.

## 2024-07-31 NOTE — Discharge Instructions (Addendum)
 What Are Falls and Why Do They Matter? A fall is when someone comes to rest on the ground or a lower level by accident. Falls are common, especially in older adults, and can lead to injuries like broken bones, head injuries, or a loss of confidence. More than 1 in 4 adults over age 70 falls each year, and falls are a leading cause of injury in this age group.[6] Why Do Falls Happen? Falls can happen for many reasons, including:  Weak muscles or poor balance  Problems with walking (gait)  Poor vision or hearing  Side effects from medicines  Low blood pressure when standing up (orthostatic hypotension)  Hazards at home (like loose rugs or poor lighting)[6] How Can Falls Be Prevented? The best way to prevent falls is to address the things that increase risk. The most effective steps include:  Exercise: Activities that improve strength and balance, such as tai chi, group exercise classes, or physical therapy, can lower the risk of falling. Studies show that these programs can reduce falls by about 20%.[6][6]  Home Safety: Remove tripping hazards, improve lighting, and install grab bars in bathrooms.  Medication Review: Some medicines can make people dizzy or sleepy. A doctor or pharmacist can review medications to see if any can be changed or stopped.  Vision and Hearing: Regular check-ups help make sure glasses and hearing aids are up to date.  Footwear: Wear shoes that fit well and have non-slip soles.  Vitamin D: For people with low vitamin D or at high risk for falls, supplements may help.[3] If someone has already fallen or is worried about falling, a healthcare provider may recommend a full assessment to look for specific risks and create a personalized plan.[2-3][6][6] What Is Delayed Onset Muscle Soreness (DOMS)? DOMS is the muscle pain and stiffness that usually starts 12-24 hours after new or intense exercise. It can last up to 3-5 days. DOMS is common when starting a new activity or  increasing exercise intensity, especially with movements that involve lowering weights or going down stairs (eccentric exercise).[4-5] Why Does DOMS Happen? DOMS is caused by tiny injuries to muscle fibers and the surrounding tissue during exercise. This leads to inflammation and soreness. Recent research suggests that the connective tissue around muscles (fascia) may also play a role in the pain.[8] How Can DOMS Be Managed or Prevented? DOMS usually goes away on its own. The following strategies may help reduce soreness or speed recovery:  Gentle Activity: Light exercise can temporarily relieve soreness, but rest is important if pain is severe.[4-5]  Massage: May help reduce pain, especially if done within 24-48 hours after exercise, though results can vary.[1][5]  Cold Therapy (Ice Packs): Cooling the sore area may help with pain, especially right after exercise.[1]  Compression and Kinesiotaping: These may help reduce soreness at later stages (48-72 hours), but evidence is mixed.[1]  Pain Relievers: Over-the-counter medicines like ibuprofen  can help, but should be used carefully and not for long periods.[4-5][7]  Progressive Exercise: Gradually increasing exercise intensity over 1-2 weeks can help prevent DOMS.[5]  Foam Rolling: May help with soreness by targeting the connective tissue.[8] There is little evidence that stretching, electrical stimulation, or ultrasound are effective for DOMS.[1][5][7] When to Seek Medical Help  If muscle pain is severe, lasts more than a week, or is accompanied by swelling, dark urine, or weakness, contact a healthcare provider.  If a fall results in pain, inability to get up, confusion, or head injury, seek medical attention right away.  RETURN  IMMEDIATELY IF you develop a sudden, severe headache or confusion, become poorly responsive or faint, develop a fever above 100.25F or problem breathing, have a change in speech, vision, swallowing, or understanding, or  develop new weakness, numbness, tingling, incoordination, or have a seizure.  Key Points  Falls and muscle soreness are common, but there are proven ways to reduce risk and manage symptoms.  Regular exercise, home safety, and medication review are the most effective ways to prevent falls.  DOMS is normal after new or intense exercise and usually gets better with time and gentle care. For more information, visit the CDC STEADI program or talk to a healthcare provider about fall prevention and safe exercise.[2]

## 2024-07-31 NOTE — ED Provider Notes (Signed)
 [place] EMERGENCY DEPARTMENT AT [facility] Provider Note   CSN: [id number] Arrival date & time: 07/31/24  1203     Patient presents with: Brandi Roberts is a 68 y.o. female with a past medical history of diabetes, DVT on Eliquis who presents to the emergency department for fall.  Patient reports that on Saturday she was at a family gathering and tripped over a cord falling directly onto her right side.  She did hit her head.  She complains of pain in her right shoulder and the right side of her rib cage all the way down the back of her legs, her lower back and tailbone.  She has been taking gabapentin  without relief of her symptoms.  She has not had an associated headache without vision changes nausea, vomiting.  It is only mild.  She cannot take Tylenol  because it tears up my stomach.  And she does not take ibuprofen  because before it made her blood pressure elevated.    Fall       Prior to Admission medications   Medication Sig Start Date End Date Taking? Authorizing Provider  albuterol  (VENTOLIN  HFA) 108 (90 Base) MCG/ACT inhaler Inhale 2 puffs into the lungs every 6 (six) hours as needed. 05/21/21   [provider]  ALPRAZolam  (XANAX ) 0.25 MG tablet TAKE 1 TABLET BY MOUTH THREE TIMES A DAY AS NEEDED FOR ANXIETY 07/04/24   Timmy Maude SAUNDERS, MD  amLODipine (NORVASC) 10 MG tablet Take 10 mg by mouth daily. 07/12/21   [provider]  atorvastatin  (LIPITOR) 10 MG tablet Take 5 mg by mouth at bedtime. 05/10/21   [provider]  benazepril  (LOTENSIN ) 40 MG tablet Take 40 mg by mouth daily. 09/07/21   [provider]  diphenoxylate -atropine  (LOMOTIL ) 2.5-0.025 MG tablet Take 2 tablets by mouth 4 (four) times daily as needed for diarrhea or loose stools. 02/01/24   Timmy Maude SAUNDERS, MD  doxazosin (CARDURA) 2 MG tablet Take 1 mg by mouth daily. 09/07/21   [provider]  ELIQUIS 5 MG TABS tablet Take 5 mg by mouth 2 (two)  times daily. 09/04/21   [provider]  fluconazole  (DIFLUCAN ) 200 MG tablet TAKE 1 TABLET BY MOUTH EVERY DAY 07/08/24   Timmy Maude SAUNDERS, MD  gabapentin  (NEURONTIN ) 600 MG tablet Take 600 mg by mouth at bedtime. 08/06/21   [provider]  hydrALAZINE  (APRESOLINE ) 50 MG tablet Take 50 mg by mouth 2 (two) times daily. 09/15/21   [provider]  lidocaine -diphenhydrAMINE -alum & mag hydroxide-simeth-nystatin  Swish and spit 5 mLs by mouth 4 (four) times daily -  before meals and at bedtime for mouth sores. Patient not taking: Reported on 03/28/2024 09/21/23   Timmy Maude SAUNDERS, MD  loperamide  HCl (IMODIUM ) 1 MG/7.5ML suspension Take 15 mLs (2 mg total) by mouth as needed for diarrhea or loose stools. Patient not taking: Reported on 03/28/2024 10/31/23   Timmy Maude SAUNDERS, MD  magic mouthwash w/lidocaine  SOLN Swish and spit 5 mls by mouth before meals and at bedtime for mouth sores. Patient not taking: Reported on 03/28/2024 09/21/23   Timmy Maude SAUNDERS, MD  magnesium oxide (MAG-OX) 400 MG tablet Take 1 tablet by mouth 2 (two) times daily. 12/25/23   [provider]  metFORMIN (GLUCOPHAGE) 1000 MG tablet Take 1,000 mg by mouth 2 (two) times daily. 08/13/21   [provider]  metoprolol  succinate (TOPROL -XL) 100 MG 24 hr tablet Take 100 mg by mouth daily.  09/07/21   [provider]  potassium chloride  SA (KLOR-CON  M) 20 MEQ tablet Take 20 mEq by mouth daily. 12/21/23   [provider]  triamterene -hydrochlorothiazide  (MAXZIDE ) 75-50 MG per tablet Take 1 tablet by mouth daily. 01/16/14   [provider]  Vitamin D, Ergocalciferol, (DRISDOL) 1.25 MG (50000 UNIT) CAPS capsule Take 50,000 Units by mouth once a week. 09/13/21   [provider]    Allergies: Levofloxacin    Review of Systems  Updated Vital Signs BP (!) 165/61 (BP Location: Right Arm)   Pulse 61   Temp 98.1 F (36.7 C)   Resp 18   Ht 5' 6 (1.676 m)   Wt 88 kg   SpO2  100%   BMI 31.31 kg/m   Physical Exam Vitals and nursing note reviewed.  Constitutional:      General: She is not in acute distress.    Appearance: She is well-developed. She is not diaphoretic.  HENT:     Head: Normocephalic and atraumatic.     Right Ear: External ear normal.     Left Ear: External ear normal.     Nose: Nose normal.     Mouth/Throat:     Mouth: Mucous membranes are moist.  Eyes:     General: No scleral icterus.    Conjunctiva/sclera: Conjunctivae normal.  Cardiovascular:     Rate and Rhythm: Normal rate and regular rhythm.     Heart sounds: Normal heart sounds. No murmur heard.    No friction rub. No gallop.  Pulmonary:     Effort: Pulmonary effort is normal. No respiratory distress.     Breath sounds: Normal breath sounds.  Chest:     Chest wall: No tenderness or crepitus.  Abdominal:     General: Bowel sounds are normal. There is no distension.     Palpations: Abdomen is soft. There is no mass.     Tenderness: There is no abdominal tenderness. There is no guarding.  Musculoskeletal:     Cervical back: Normal range of motion. No tenderness.     Thoracic back: No tenderness.     Lumbar back: No tenderness (no ttp ov the Tailbone).     Comments: Ambulatory   Skin:    General: Skin is warm and dry.  Neurological:     Mental Status: She is alert and oriented to person, place, and time.     Comments: Speech is clear and goal oriented, follows commands Major Cranial nerves without deficit, no facial droop Normal strength in upper and lower extremities bilaterally including dorsiflexion and plantar flexion, strong and equal grip strength Sensation normal to light and sharp touch Moves extremities without ataxia, coordination intact Normal finger to nose and rapid alternating movements Neg romberg, no pronator drift Normal gait Normal heel-shin and balance   Psychiatric:        Behavior: Behavior normal.     (all labs ordered are listed, but only  abnormal results are displayed) Labs Reviewed - No data to display  EKG: None  Radiology: DG Ribs Unilateral W/Chest Right Result Date: 07/31/2024 CLINICAL DATA:  Pain after recent fall. EXAM: RIGHT RIBS AND CHEST - 3+ VIEW COMPARISON:  None Available. FINDINGS: No acute osseous abnormality is identified. No obvious displaced rib fracture. There is no evidence of pneumothorax or pleural effusion. No focal consolidation. Heart size and mediastinal contours are within normal limits. Aortic atherosclerosis. Left chest wall Port-A-Cath tip overlies the superior cavoatrial junction. IMPRESSION: 1. No displaced rib fracture  identified. Should the patient's symptoms persist or worsen, repeat radiographs of the ribs in 10 - 14 days maybe of use to detect subtle nondisplaced rib fractures (which are commonly occult on initial imaging). 2. No acute cardiopulmonary findings. Electronically Signed   By: Harrietta Sherry M.D.   On: 07/31/2024 14:11   DG Shoulder Right Result Date: 07/31/2024 CLINICAL DATA:  Pain after recent fall. EXAM: RIGHT SHOULDER - 2+ VIEW COMPARISON:  None Available. FINDINGS: There is no evidence of acute fracture or dislocation. Mild acromioclavicular osteoarthritis. Surgical clips in the right axilla. IMPRESSION: No acute fracture or dislocation of the right shoulder. Electronically Signed   By: Harrietta Sherry M.D.   On: 07/31/2024 14:08   CT Head Wo Contrast Result Date: 07/31/2024 EXAM: CT HEAD WITHOUT CONTRAST 07/31/2024 01:03:40 PM TECHNIQUE: CT of the head was performed without the administration of intravenous contrast. Automated exposure control, iterative reconstruction, and/or weight based adjustment of the mA/kV was utilized to reduce the radiation dose to as low as reasonably achievable. COMPARISON: None available. CLINICAL HISTORY: Head trauma, minor (Age >= 65y). Fall x4 days ago, hit head, c/o HA since. FINDINGS: BRAIN AND VENTRICLES: No acute hemorrhage. Gray-white  differentiation is preserved. No hydrocephalus. No extra-axial collection. No mass effect or midline shift. ORBITS: No acute abnormality. SINUSES: No acute abnormality. SOFT TISSUES AND SKULL: No acute soft tissue abnormality. No skull fracture. IMPRESSION: 1. No acute intracranial abnormality. Electronically signed by: Franky Stanford MD 07/31/2024 01:37 PM EDT RP Workstation: [id number]     Procedures   Medications Ordered in the ED - No data to display                                  Medical Decision Making Amount and/or Complexity of Data Reviewed Radiology: ordered.  68 year old female with fall 4 days ago on Eliquis.  Differential diagnosis includes fracture, pneumothorax, rib fracture, tailbone fracture, soft tissue injury, intracranial hemorrhage, subdural hematoma. I reviewed images ordered at triage including rib view chest x-ray, right shoulder x-ray and CT head. There are no acute findings on exam. Really has not taken anything for pain and I have given her a shot of Toradol . I do not think that she needs further imaging for her lumbar spine or tailbone as she is nontender to palpation and ambulatory.  This is not suggestive of pathologic fracture or tailbone fracture and that would be managed supportively anyway.    Although patient is afraid that her blood pressure will be elevated if she is able to tolerate NSAIDs and I think a very short course of this would certainly help with her discomfort.  Will go with 600 mg to with about a 5-day supply.  Patient appears otherwise appropriate for discharge at this time with strict return precautions outpatient follow-up.      Final diagnoses:  None    ED Discharge Orders     None          Arloa Chroman, PA-C 07/31/24 1521    Tegeler, Lonni PARAS, MD 07/31/24 1535

## 2024-08-05 ENCOUNTER — Inpatient Hospital Stay (HOSPITAL_BASED_OUTPATIENT_CLINIC_OR_DEPARTMENT_OTHER): Admitting: Hematology & Oncology

## 2024-08-05 ENCOUNTER — Inpatient Hospital Stay

## 2024-08-05 ENCOUNTER — Inpatient Hospital Stay: Attending: Hematology & Oncology

## 2024-08-05 ENCOUNTER — Ambulatory Visit: Payer: Self-pay | Admitting: Hematology & Oncology

## 2024-08-05 ENCOUNTER — Encounter: Payer: Self-pay | Admitting: Hematology & Oncology

## 2024-08-05 VITALS — BP 174/57 | HR 61 | Temp 98.5°F | Resp 18 | Wt 197.0 lb

## 2024-08-05 DIAGNOSIS — Z1731 Human epidermal growth factor receptor 2 positive status: Secondary | ICD-10-CM | POA: Diagnosis not present

## 2024-08-05 DIAGNOSIS — C50911 Malignant neoplasm of unspecified site of right female breast: Secondary | ICD-10-CM

## 2024-08-05 DIAGNOSIS — Z171 Estrogen receptor negative status [ER-]: Secondary | ICD-10-CM

## 2024-08-05 DIAGNOSIS — D631 Anemia in chronic kidney disease: Secondary | ICD-10-CM

## 2024-08-05 DIAGNOSIS — D6481 Anemia due to antineoplastic chemotherapy: Secondary | ICD-10-CM | POA: Insufficient documentation

## 2024-08-05 DIAGNOSIS — Z9011 Acquired absence of right breast and nipple: Secondary | ICD-10-CM | POA: Diagnosis not present

## 2024-08-05 DIAGNOSIS — T451X5A Adverse effect of antineoplastic and immunosuppressive drugs, initial encounter: Secondary | ICD-10-CM | POA: Insufficient documentation

## 2024-08-05 DIAGNOSIS — Z1722 Progesterone receptor negative status: Secondary | ICD-10-CM | POA: Insufficient documentation

## 2024-08-05 LAB — FERRITIN: Ferritin: 240 ng/mL (ref 11–307)

## 2024-08-05 LAB — CBC WITH DIFFERENTIAL (CANCER CENTER ONLY)
Abs Immature Granulocytes: 0.05 K/uL (ref 0.00–0.07)
Basophils Absolute: 0 K/uL (ref 0.0–0.1)
Basophils Relative: 0 %
Eosinophils Absolute: 0.1 K/uL (ref 0.0–0.5)
Eosinophils Relative: 1 %
HCT: 33.9 % — ABNORMAL LOW (ref 36.0–46.0)
Hemoglobin: 11.5 g/dL — ABNORMAL LOW (ref 12.0–15.0)
Immature Granulocytes: 1 %
Lymphocytes Relative: 19 %
Lymphs Abs: 1.7 K/uL (ref 0.7–4.0)
MCH: 31.5 pg (ref 26.0–34.0)
MCHC: 33.9 g/dL (ref 30.0–36.0)
MCV: 92.9 fL (ref 80.0–100.0)
Monocytes Absolute: 0.6 K/uL (ref 0.1–1.0)
Monocytes Relative: 7 %
Neutro Abs: 6.1 K/uL (ref 1.7–7.7)
Neutrophils Relative %: 72 %
Platelet Count: 186 K/uL (ref 150–400)
RBC: 3.65 MIL/uL — ABNORMAL LOW (ref 3.87–5.11)
RDW: 12.4 % (ref 11.5–15.5)
WBC Count: 8.5 K/uL (ref 4.0–10.5)
nRBC: 0 % (ref 0.0–0.2)

## 2024-08-05 LAB — RETICULOCYTES
Immature Retic Fract: 2.1 % — ABNORMAL LOW (ref 2.3–15.9)
RBC.: 3.6 MIL/uL — ABNORMAL LOW (ref 3.87–5.11)
Retic Count, Absolute: 29.2 K/uL (ref 19.0–186.0)
Retic Ct Pct: 0.8 % (ref 0.4–3.1)

## 2024-08-05 LAB — CMP (CANCER CENTER ONLY)
ALT: 26 U/L (ref 0–44)
AST: 32 U/L (ref 15–41)
Albumin: 4 g/dL (ref 3.5–5.0)
Alkaline Phosphatase: 69 U/L (ref 38–126)
Anion gap: 11 (ref 5–15)
BUN: 18 mg/dL (ref 8–23)
CO2: 23 mmol/L (ref 22–32)
Calcium: 9.4 mg/dL (ref 8.9–10.3)
Chloride: 96 mmol/L — ABNORMAL LOW (ref 98–111)
Creatinine: 1.17 mg/dL — ABNORMAL HIGH (ref 0.44–1.00)
GFR, Estimated: 50 mL/min — ABNORMAL LOW (ref >60)
Glucose, Bld: 94 mg/dL (ref 70–99)
Potassium: 4.2 mmol/L (ref 3.5–5.1)
Sodium: 130 mmol/L — ABNORMAL LOW (ref 135–145)
Total Bilirubin: 0.3 mg/dL (ref 0.0–1.2)
Total Protein: 6.8 g/dL (ref 6.5–8.1)

## 2024-08-05 LAB — IRON AND IRON BINDING CAPACITY (CC-WL,HP ONLY)
Iron: 87 ug/dL (ref 28–170)
Saturation Ratios: 32 % — ABNORMAL HIGH (ref 10.4–31.8)
TIBC: 272 ug/dL (ref 250–450)
UIBC: 185 ug/dL

## 2024-08-05 NOTE — Progress Notes (Signed)
 [facility] Follow Up Visit  Brandi Roberts [id number] [birth year] 68 y.o. 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  She looks quite good.  She had a celebration of breast cancer journey at a club.  I am so happy that she was able to celebrate this and have people acknowledge her journey and her success.  She ate quite a bit.  Photographs were taken.  She had on a special outfit.  Her breast cancer is not a problem right now.  She is in remission.  Her last CA 27.29 was normal at 24  She has had no cough or shortness of breath.  She has had no nausea or vomiting.  She has had no bleeding.  But no fever.  She has had no leg swelling.  She had no dysphagia or odynophagia.  She has had no headache.  I am happy that she has come back so well.  Overall, I will send her performance status is ECOG 0.  Medications:  Allergies as of 08/05/2024       Reactions   Levofloxacin Nausea And Vomiting, Other (See Comments)   dizziness Other Reaction(s): Other (See Comments)    dizziness        Medication List        Accurate as of August 05, 2024 11:01 AM. If you have any questions, ask your nurse or doctor.          albuterol  108 (90 Base) MCG/ACT inhaler Commonly known as: VENTOLIN  HFA Inhale 2 puffs into the lungs every 6 (six) hours as needed.   ALPRAZolam  0.25 MG tablet Commonly known as: XANAX  TAKE 1 TABLET BY MOUTH THREE TIMES A DAY AS NEEDED FOR ANXIETY   amLODipine 10 MG tablet Commonly  known as: NORVASC Take 10 mg by mouth daily.   atorvastatin  10 MG tablet Commonly known as: LIPITOR Take 5 mg by mouth at bedtime.   benazepril  40 MG tablet Commonly known as: LOTENSIN  Take 40 mg by mouth daily.   diphenoxylate -atropine  2.5-0.025 MG tablet Commonly known as: LOMOTIL  Take 2 tablets by mouth 4 (four) times daily as needed for diarrhea or loose stools.   doxazosin 2 MG tablet Commonly known as: CARDURA Take 1 mg by mouth daily.   Eliquis 5 MG Tabs tablet Generic drug: apixaban Take 5 mg by mouth 2 (two) times daily.   fluconazole  200 MG tablet Commonly known as: DIFLUCAN  TAKE 1 TABLET BY MOUTH EVERY DAY   gabapentin  600 MG tablet Commonly known as: NEURONTIN  Take 600 mg by mouth at bedtime.   hydrALAZINE  50 MG tablet Commonly known as: APRESOLINE  Take 50 mg by mouth 2 (two) times daily.   ibuprofen  600 MG tablet Commonly known as: ADVIL  Take 1 tablet (600 mg total) by mouth every 6 (six) hours as needed.   loperamide  HCl 1 MG/7.5ML suspension Commonly known as: IMODIUM  Take 15 mLs (2 mg total) by mouth as needed for diarrhea or loose stools.   magic mouthwash w/lidocaine  Soln Swish and spit 5 mls by  mouth before meals and at bedtime for mouth sores.   lidocaine -diphenhydrAMINE -alum & mag hydroxide-simeth-nystatin  Swish and spit 5 mLs by mouth 4 (four) times daily -  before meals and at bedtime for mouth sores.   magnesium oxide 400 MG tablet Commonly known as: MAG-OX Take 1 tablet by mouth 2 (two) times daily.   metFORMIN 1000 MG tablet Commonly known as: GLUCOPHAGE Take 1,000 mg by mouth 2 (two) times daily.   metoprolol  succinate 100 MG 24 hr tablet Commonly known as: TOPROL -XL Take 100 mg by mouth daily.   potassium chloride  SA 20 MEQ tablet Commonly known as: KLOR-CON  M Take 20 mEq by mouth daily.   triamterene -hydrochlorothiazide  75-50 MG tablet Commonly known as: MAXZIDE  Take 1 tablet by mouth daily.   Vitamin D (Ergocalciferol)  1.25 MG (50000 UNIT) Caps capsule Commonly known as: DRISDOL Take 50,000 Units by mouth once a week.        Allergies:  Allergies  Allergen Reactions   Levofloxacin Nausea And Vomiting and Other (See Comments)    dizziness  Other Reaction(s): Other (See Comments)    dizziness    Past Medical History, Surgical history, Social history, and Family History were reviewed and updated.  Review of Systems: Review of Systems  Constitutional: Negative.   HENT: Negative.    Eyes: Negative.   Respiratory: Negative.    Cardiovascular: Negative.   Gastrointestinal: Negative.   Genitourinary: Negative.   Musculoskeletal: Negative.   Skin: Negative.   Neurological: Negative.   Endo/Heme/Allergies: Negative.   Psychiatric/Behavioral: Negative.       Physical Exam: Her vital signs show temperature of 98.5.  Pulse 61.  Blood pressure 174/57.  Weight is 197 pounds.   Pump Wt Readings from Last 3 Encounters:  08/05/24 197 lb (89.4 kg)  07/31/24 194 lb 0.1 oz (88 kg)  06/24/24 195 lb (88.5 kg)    Physical Exam Vitals reviewed.  Constitutional:      Comments: Her breast exam shows left breast with no masses, edema or erythema.  There is no left axillary adenopathy.  Right chest wall shows healed mastectomy scar.  There is little bit of hyperpigmentation.  She has had no masses.  There is no erythema.  There is no right axillary adenopathy.  HENT:     Head: Normocephalic and atraumatic.  Eyes:     Pupils: Pupils are equal, round, and reactive to light.  Cardiovascular:     Rate and Rhythm: Normal rate and regular rhythm.     Heart sounds: Normal heart sounds.  Pulmonary:     Effort: Pulmonary effort is normal.     Breath sounds: Normal breath sounds.  Abdominal:     General: Bowel sounds are normal.     Palpations: Abdomen is soft.  Musculoskeletal:        General: No tenderness or deformity. Normal range of motion.     Cervical back: Normal range of motion.   Lymphadenopathy:     Cervical: No cervical adenopathy.  Skin:    General: Skin is warm and dry.     Findings: No erythema or rash.  Neurological:     Mental Status: She is alert and oriented to person, place, and time.  Psychiatric:        Behavior: Behavior normal.        Thought Content: Thought content normal.        Judgment: Judgment normal.      Lab Results  Component Value Date   WBC 8.5 08/05/2024  HGB 11.5 (L) 08/05/2024   HCT 33.9 (L) 08/05/2024   MCV 92.9 08/05/2024   PLT 186 08/05/2024   Lab Results  Component Value Date   FERRITIN 194 06/24/2024   IRON 89 06/24/2024   TIBC 321 06/24/2024   UIBC 232 06/24/2024   IRONPCTSAT 28 06/24/2024   Lab Results  Component Value Date   RETICCTPCT 0.8 08/05/2024   RBC 3.60 (L) 08/05/2024   No results found for: KPAFRELGTCHN, LAMBDASER, KAPLAMBRATIO No results found for: IGGSERUM, IGA, IGMSERUM No results found for: STEPHANY RINGS, A1GS, A2GS, EARLA JOANNIE KNIGHTS, MSPIKE, SPEI   Chemistry      Component Value Date/Time   NA 130 (L) 08/05/2024 1018   K 4.2 08/05/2024 1018   CL 96 (L) 08/05/2024 1018   CO2 23 08/05/2024 1018   BUN 18 08/05/2024 1018   CREATININE 1.17 (H) 08/05/2024 1018      Component Value Date/Time   CALCIUM  9.4 08/05/2024 1018   ALKPHOS 69 08/05/2024 1018   AST 32 08/05/2024 1018   ALT 26 08/05/2024 1018   BILITOT 0.3 08/05/2024 1018       Impression and Plan: Ms. Neumeier is a pleasant 68 yo African American female with invasive ductal carcinoma of the right breast, ER-/PR-/HER2 +.  She ultimately underwent mastectomy treated with mastectomy.   She had 1 positive lymph node.  Unfortunately, she recurred quickly after anti-HER2 therapy.  She has had 4 cycles of chemotherapy with carboplatinum and Taxotere .  She really had a tough time with this.  We also had her on anti-HER2 therapy.    For right now, I do not see any recurrent breast cancer.   I do not think any scans on her.  We probably plan to get her back in the Fall.  I know that she had a recent fall.  She landed on her right side.  Thankfully, she did not do any damage.    Maude JONELLE Crease, MD 8/11/202511:01 AM

## 2024-08-06 LAB — CANCER ANTIGEN 27.29: CA 27.29: 30.3 U/mL (ref 0.0–38.6)

## 2024-08-08 ENCOUNTER — Other Ambulatory Visit: Payer: Self-pay | Admitting: Hematology & Oncology

## 2024-08-08 DIAGNOSIS — C50911 Malignant neoplasm of unspecified site of right female breast: Secondary | ICD-10-CM

## 2024-09-19 ENCOUNTER — Other Ambulatory Visit: Payer: Self-pay | Admitting: *Deleted

## 2024-09-19 DIAGNOSIS — C50911 Malignant neoplasm of unspecified site of right female breast: Secondary | ICD-10-CM

## 2024-09-20 ENCOUNTER — Encounter: Payer: Self-pay | Admitting: Hematology & Oncology

## 2024-09-20 MED ORDER — ALPRAZOLAM 0.25 MG PO TABS: 0.2500 mg | ORAL_TABLET | Freq: Three times a day (TID) | ORAL | 0 refills | Status: DC | PRN

## 2024-09-23 ENCOUNTER — Other Ambulatory Visit: Payer: Self-pay | Admitting: *Deleted

## 2024-09-23 DIAGNOSIS — C50911 Malignant neoplasm of unspecified site of right female breast: Secondary | ICD-10-CM

## 2024-09-23 MED ORDER — ALPRAZOLAM 0.25 MG PO TABS: 0.2500 mg | ORAL_TABLET | Freq: Three times a day (TID) | ORAL | 0 refills | Status: DC | PRN

## 2024-09-30 ENCOUNTER — Inpatient Hospital Stay: Attending: Hematology & Oncology

## 2024-09-30 ENCOUNTER — Inpatient Hospital Stay

## 2024-09-30 ENCOUNTER — Encounter: Payer: Self-pay | Admitting: Hematology & Oncology

## 2024-09-30 ENCOUNTER — Inpatient Hospital Stay (HOSPITAL_BASED_OUTPATIENT_CLINIC_OR_DEPARTMENT_OTHER): Admitting: Hematology & Oncology

## 2024-09-30 VITALS — BP 160/83 | HR 65 | Temp 97.7°F | Resp 17 | Ht 66.0 in

## 2024-09-30 DIAGNOSIS — Z171 Estrogen receptor negative status [ER-]: Secondary | ICD-10-CM | POA: Diagnosis not present

## 2024-09-30 DIAGNOSIS — Z1732 Human epidermal growth factor receptor 2 negative status: Secondary | ICD-10-CM | POA: Diagnosis not present

## 2024-09-30 DIAGNOSIS — C50911 Malignant neoplasm of unspecified site of right female breast: Secondary | ICD-10-CM

## 2024-09-30 DIAGNOSIS — D6481 Anemia due to antineoplastic chemotherapy: Secondary | ICD-10-CM | POA: Insufficient documentation

## 2024-09-30 DIAGNOSIS — Z7984 Long term (current) use of oral hypoglycemic drugs: Secondary | ICD-10-CM | POA: Insufficient documentation

## 2024-09-30 DIAGNOSIS — M79621 Pain in right upper arm: Secondary | ICD-10-CM | POA: Diagnosis not present

## 2024-09-30 DIAGNOSIS — I82409 Acute embolism and thrombosis of unspecified deep veins of unspecified lower extremity: Secondary | ICD-10-CM | POA: Diagnosis not present

## 2024-09-30 DIAGNOSIS — Z1721 Progesterone receptor positive status: Secondary | ICD-10-CM | POA: Diagnosis not present

## 2024-09-30 DIAGNOSIS — Z9011 Acquired absence of right breast and nipple: Secondary | ICD-10-CM | POA: Insufficient documentation

## 2024-09-30 DIAGNOSIS — D631 Anemia in chronic kidney disease: Secondary | ICD-10-CM

## 2024-09-30 LAB — CBC WITH DIFFERENTIAL (CANCER CENTER ONLY)
Abs Immature Granulocytes: 0.03 K/uL (ref 0.00–0.07)
Basophils Absolute: 0 K/uL (ref 0.0–0.1)
Basophils Relative: 1 %
Eosinophils Absolute: 0.1 K/uL (ref 0.0–0.5)
Eosinophils Relative: 1 %
HCT: 28.8 % — ABNORMAL LOW (ref 36.0–46.0)
Hemoglobin: 9.8 g/dL — ABNORMAL LOW (ref 12.0–15.0)
Immature Granulocytes: 0 %
Lymphocytes Relative: 21 %
Lymphs Abs: 1.8 K/uL (ref 0.7–4.0)
MCH: 31.9 pg (ref 26.0–34.0)
MCHC: 34 g/dL (ref 30.0–36.0)
MCV: 93.8 fL (ref 80.0–100.0)
Monocytes Absolute: 0.7 K/uL (ref 0.1–1.0)
Monocytes Relative: 8 %
Neutro Abs: 6.1 K/uL (ref 1.7–7.7)
Neutrophils Relative %: 69 %
Platelet Count: 215 K/uL (ref 150–400)
RBC: 3.07 MIL/uL — ABNORMAL LOW (ref 3.87–5.11)
RDW: 13.3 % (ref 11.5–15.5)
WBC Count: 8.7 K/uL (ref 4.0–10.5)
nRBC: 0 % (ref 0.0–0.2)

## 2024-09-30 LAB — CMP (CANCER CENTER ONLY)
ALT: 12 U/L (ref 0–44)
AST: 22 U/L (ref 15–41)
Albumin: 3.9 g/dL (ref 3.5–5.0)
Alkaline Phosphatase: 71 U/L (ref 38–126)
Anion gap: 11 (ref 5–15)
BUN: 32 mg/dL — ABNORMAL HIGH (ref 8–23)
CO2: 23 mmol/L (ref 22–32)
Calcium: 9.4 mg/dL (ref 8.9–10.3)
Chloride: 99 mmol/L (ref 98–111)
Creatinine: 1.42 mg/dL — ABNORMAL HIGH (ref 0.44–1.00)
GFR, Estimated: 40 mL/min — ABNORMAL LOW (ref >60)
Glucose, Bld: 86 mg/dL (ref 70–99)
Potassium: 4 mmol/L (ref 3.5–5.1)
Sodium: 132 mmol/L — ABNORMAL LOW (ref 135–145)
Total Bilirubin: 0.4 mg/dL (ref 0.0–1.2)
Total Protein: 6.8 g/dL (ref 6.5–8.1)

## 2024-09-30 LAB — IRON AND IRON BINDING CAPACITY (CC-WL,HP ONLY)
Iron: 97 ug/dL (ref 28–170)
Saturation Ratios: 28 % (ref 10.4–31.8)
TIBC: 350 ug/dL (ref 250–450)
UIBC: 253 ug/dL

## 2024-09-30 LAB — FERRITIN: Ferritin: 259 ng/mL (ref 11–307)

## 2024-09-30 MED ORDER — DARBEPOETIN ALFA 300 MCG/0.6ML IJ SOSY
300.0000 ug | PREFILLED_SYRINGE | Freq: Once | INTRAMUSCULAR | Status: AC
  Administered 2024-09-30: 300 ug via SUBCUTANEOUS
  Filled 2024-09-30: qty 0.6

## 2024-09-30 NOTE — Patient Instructions (Addendum)
 [facility] Guide An implanted port is a device that is placed under the skin. It is usually placed in the chest. The device may vary based on the need. Implanted ports can be used to give IV medicine, to take blood, or to give fluids. You may have an implanted port if: You need IV medicine that would be irritating to the small veins in your hands or arms. You need IV medicines, such as chemotherapy, for a long period of time. You need IV nutrition for a long period of time. You may have fewer limitations when using a port than you would if you used other types of long-term IVs. You will also likely be able to return to normal activities after your incision heals. An implanted port has two main parts: Reservoir. The reservoir is the part where a needle is inserted to give medicines or draw blood. The reservoir is round. After the port is placed, it appears as a small, raised area under your skin. Catheter. The catheter is a small, thin tube that connects the reservoir to a vein. Medicine that is inserted into the reservoir goes into the catheter and then into the vein. How is my port accessed? To access your port: A numbing cream may be placed on the skin over the port site. Your health care provider will put on a mask and sterile gloves. The skin over your port will be cleaned carefully with a germ-killing soap and allowed to dry. Your health care provider will gently pinch the port and insert a needle into it. Your health care provider will check for a blood return to make sure the port is in the vein and is still working (patent). If your port needs to remain accessed to get medicine continuously (constant infusion), your health care provider will place a clear bandage (dressing) over the needle site. The dressing and needle will need to be changed every week, or as told by your health care provider. What is flushing? Flushing helps keep the port working. Follow instructions from your  health care provider about how and when to flush the port. Ports are usually flushed with saline solution or a medicine called heparin . The need for flushing will depend on how the port is used: If the port is only used from time to time to give medicines or draw blood, the port may need to be flushed: Before and after medicines have been given. Before and after blood has been drawn. As part of routine maintenance. Flushing may be recommended every 4-6 weeks. If a constant infusion is running, the port may not need to be flushed. Throw away any syringes in a disposal container that is meant for sharp items (sharps container). You can buy a sharps container from a pharmacy, or you can make one by using an empty hard plastic bottle with a cover. How long will my port stay implanted? The port can stay in for as long as your health care provider thinks it is needed. When it is time for the port to come out, a surgery will be done to remove it. The surgery will be similar to the procedure that was done to put the port in. Follow these instructions at home: Caring for your port and port site Flush your port as told by your health care provider. If you need an infusion over several days, follow instructions from your health care provider about how to take care of your port site. Make sure you: Change your  dressing as told by your health care provider. Wash your hands with soap and water for at least 20 seconds before and after you change your dressing. If soap and water are not available, use alcohol-based hand sanitizer. Place any used dressings or infusion bags into a plastic bag. Throw that bag in the trash. Keep the dressing that covers the needle clean and dry. Do not get it wet. Do not use scissors or sharp objects near the infusion tubing. Keep any external tubes clamped, unless they are being used. Check your port site every day for signs of infection. Check for: Redness, swelling, or  pain. Fluid or blood. Warmth. Pus or a bad smell. Protect the skin around the port site. Avoid wearing bra straps that rub or irritate the site. Protect the skin around your port from seat belts. Place a soft pad over your chest if needed. Bathe or shower as told by your health care provider. The site may get wet as long as you are not actively receiving an infusion. General instructions  Return to your normal activities as told by your health care provider. Ask your health care provider what activities are safe for you. Carry a medical alert card or wear a medical alert bracelet at all times. This will let health care providers know that you have an implanted port in case of an emergency. Where to find more information American Cancer Society: www.cancer.org American Society of Clinical Oncology: www.cancer.net Contact a health care provider if: You have a fever or chills. You have redness, swelling, or pain at the port site. You have fluid or blood coming from your port site. Your incision feels warm to the touch. You have pus or a bad smell coming from the port site. Summary Implanted ports are usually placed in the chest for long-term IV access. Follow instructions from your health care provider about flushing the port and changing bandages (dressings). Take care of the area around your port by avoiding clothing that puts pressure on the area, and by watching for signs of infection. Protect the skin around your port from seat belts. Place a soft pad over your chest if needed. Contact a health care provider if you have a fever or you have redness, swelling, pain, fluid, or a bad smell at the port site. This information is not intended to replace advice given to you by your health care provider. Make sure you discuss any questions you have with your health care provider. Document Revised: 06/15/2021 Document Reviewed: 06/15/2021 Elsevier Patient Education  2024 Elsevier  Inc.Darbepoetin Alfa  Injection What is this medication? DARBEPOETIN ALFA  (dar be POE e tin AL fa) treats low levels of red blood cells (anemia) caused by kidney disease or chemotherapy. It works by Systems analyst make more red blood cells, which reduces the need for blood transfusions. This medicine may be used for other purposes; ask your health care provider or pharmacist if you have questions. COMMON BRAND NAME(S): Aranesp  What should I tell my care team before I take this medication? They need to know if you have any of these conditions: Blood clots Cancer Heart disease High blood pressure On dialysis Seizures Stroke An unusual or allergic reaction to darbepoetin, latex, other medications, foods, dyes, or preservatives Pregnant or trying to get pregnant Breast-feeding How should I use this medication? This medication is injected into a vein or under the skin. It is usually given by a care team in a hospital or clinic setting. It may also be  given at home. If you get this medication at home, you will be taught how to prepare and give it. Use exactly as directed. Take it as directed on the prescription label at the same time every day. Keep taking it unless your care team tells you to stop. It is important that you put your used needles and syringes in a special sharps container. Do not put them in a trash can. If you do not have a sharps container, call your pharmacist or care team to get one. A special MedGuide will be given to you by the pharmacist with each prescription and refill. Be sure to read this information carefully each time. Talk to your care team about the use of this medication in children. While this medication may be used in children as young as 1 month of age for selected conditions, precautions do apply. Overdosage: If you think you have taken too much of this medicine contact a poison control center or emergency room at once. NOTE: This medicine is only for you. Do  not share this medicine with others. What if I miss a dose? If you miss a dose, take it as soon as you can. If it is almost time for your next dose, take only that dose. Do not take double or extra doses. What may interact with this medication? Epoetin alfa Methoxy polyethylene glycol-epoetin beta This list may not describe all possible interactions. Give your health care provider a list of all the medicines, herbs, non-prescription drugs, or dietary supplements you use. Also tell them if you smoke, drink alcohol, or use illegal drugs. Some items may interact with your medicine. What should I watch for while using this medication? Visit your care team for regular checks on your progress. Check your blood pressure as directed. Know what your blood pressure should be and when to contact your care team. Your condition will be monitored carefully while you are receiving this medication. You may need blood work while taking this medication. What side effects may I notice from receiving this medication? Side effects that you should report to your care team as soon as possible: Allergic reactions--skin rash, itching, hives, swelling of the face, lips, tongue, or throat Blood clot--pain, swelling, or warmth in the leg, shortness of breath, chest pain Heart attack--pain or tightness in the chest, shoulders, arms, or jaw, nausea, shortness of breath, cold or clammy skin, feeling faint or lightheaded Increase in blood pressure Rash, fever, and swollen lymph nodes Redness, blistering, peeling, or loosening of the skin, including inside the mouth Seizures Stroke--sudden numbness or weakness of the face, arm, or leg, trouble speaking, confusion, trouble walking, loss of balance or coordination, dizziness, severe headache, change in vision Side effects that usually do not require medical attention (report to your care team if they continue or are bothersome): Cough Stomach pain Swelling of the ankles, hands,  or feet This list may not describe all possible side effects. Call your doctor for medical advice about side effects. You may report side effects to FDA at 1-800-FDA-1088. Where should I keep my medication? Keep out of the reach of children and pets. Store in a refrigerator. Do not freeze. Do not shake. Protect from light. Keep this medication in the original container until you are ready to take it. See product for storage information. Get rid of any unused medication after the expiration date. To get rid of medications that are no longer needed or have expired: Take the medication to a medication take-back program.  Check with your pharmacy or law enforcement to find a location. If you cannot return the medication, ask your pharmacist or care team how to get rid of the medication safely. NOTE: This sheet is a summary. It may not cover all possible information. If you have questions about this medicine, talk to your doctor, pharmacist, or health care provider.  2024 Elsevier/Gold Standard (2022-04-13 00:00:00)

## 2024-09-30 NOTE — Progress Notes (Signed)
 [facility] Follow Up Visit  Brandi Roberts [id number] [birth year] 68 y.o. 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  She comes in with her husband.  They just got back from [facility].  They are down there for a couple of days.  That a wonderful time down there..  She feels well.  She really has had no complaints.  She has had no problems with fatigue or weakness.  She has had no change in bowel or bladder habits.  She has not noted any problems with cough or shortness of breath.  She has had no rashes.  There has been no bleeding.  Her last CA 27.29 was 30.3.  She has half good appetite.  She has had no nausea or vomiting.  There has been no bleeding.  She has had no headache.  She has had no exposure to COVID.  Her iron studies that we did back in August showed a ferritin of 240 with an iron saturation of 32%.  Overall, I would say that her performance status was probably ECOG 1.  Medications:  Allergies as of 09/30/2024       Reactions   Levofloxacin Nausea And Vomiting, Other (See Comments)   dizziness Other Reaction(s): Other (See Comments)    dizziness        Medication List        Accurate as of September 30, 2024  3:18 PM. If you have any questions, ask your nurse or doctor.          STOP taking these medications    fluconazole  200 MG tablet Commonly known as: DIFLUCAN  Stopped by: Beatris Belen R Trapper Meech   lidocaine -diphenhydrAMINE -alum & mag hydroxide-simeth-nystatin  Stopped by:  Izabella Marcantel R Seaver Machia   magic mouthwash w/lidocaine  Soln Stopped by: Waymond Meador R Jawuan Robb       TAKE these medications    albuterol  108 (90 Base) MCG/ACT inhaler Commonly known as: VENTOLIN  HFA Inhale 2 puffs into the lungs every 6 (six) hours as needed.   ALPRAZolam  0.25 MG tablet Commonly known as: XANAX  Take 1 tablet (0.25 mg total) by mouth 3 (three) times daily as needed for anxiety.   amLODipine 10 MG tablet Commonly known as: NORVASC Take 10 mg by mouth daily.   atorvastatin  10 MG tablet Commonly known as: LIPITOR Take 5 mg by mouth at bedtime.   benazepril  40 MG tablet Commonly known as: LOTENSIN  Take 40 mg by mouth daily.   diphenoxylate -atropine  2.5-0.025 MG tablet Commonly known as: LOMOTIL  Take 2 tablets by mouth 4 (four) times daily as needed for diarrhea or loose stools.   doxazosin 2 MG tablet Commonly known as: CARDURA Take 1 mg by mouth daily.   Eliquis 5 MG Tabs tablet Generic drug: apixaban Take 5 mg by mouth 2 (two) times daily.   gabapentin  600 MG tablet Commonly known as: NEURONTIN  Take 600 mg by mouth at bedtime.   hydrALAZINE  50 MG tablet Commonly known as: APRESOLINE  Take 50 mg by mouth 2 (two) times daily.   ibuprofen  600 MG tablet  Commonly known as: ADVIL  Take 1 tablet (600 mg total) by mouth every 6 (six) hours as needed.   loperamide  HCl 1 MG/7.5ML suspension Commonly known as: IMODIUM  Take 15 mLs (2 mg total) by mouth as needed for diarrhea or loose stools.   magnesium oxide 400 MG tablet Commonly known as: MAG-OX Take 1 tablet by mouth 2 (two) times daily.   metFORMIN 1000 MG tablet Commonly known as: GLUCOPHAGE Take 1,000 mg by mouth 2 (two) times daily.   metoprolol  succinate 100 MG 24 hr tablet Commonly known as: TOPROL -XL Take 100 mg by mouth daily.   potassium chloride  SA 20 MEQ tablet Commonly known as: KLOR-CON  M Take 20 mEq by mouth daily.   triamterene -hydrochlorothiazide  75-50 MG tablet Commonly known as:  MAXZIDE  Take 1 tablet by mouth daily.   Vitamin D (Ergocalciferol) 1.25 MG (50000 UNIT) Caps capsule Commonly known as: DRISDOL Take 50,000 Units by mouth once a week.        Allergies:  Allergies  Allergen Reactions   Levofloxacin Nausea And Vomiting and Other (See Comments)    dizziness  Other Reaction(s): Other (See Comments)    dizziness    Past Medical History, Surgical history, Social history, and Family History were reviewed and updated.  Review of Systems: Review of Systems  Constitutional: Negative.   HENT: Negative.    Eyes: Negative.   Respiratory: Negative.    Cardiovascular: Negative.   Gastrointestinal: Negative.   Genitourinary: Negative.   Musculoskeletal: Negative.   Skin: Negative.   Neurological: Negative.   Endo/Heme/Allergies: Negative.   Psychiatric/Behavioral: Negative.       Physical Exam: Her vital signs show temperature of 97.7.  Pulse 65.  Blood pressure 160/83.  Weight is not taken.   Pump Wt Readings from Last 3 Encounters:  08/05/24 197 lb (89.4 kg)  07/31/24 194 lb 0.1 oz (88 kg)  06/24/24 195 lb (88.5 kg)    Physical Exam Vitals reviewed.  Constitutional:      Comments: Her breast exam shows left breast with no masses, edema or erythema.  There is no left axillary adenopathy.  Right chest wall shows healed mastectomy scar.  There is little bit of hyperpigmentation.  She has had no masses.  There is no erythema.  There is no right axillary adenopathy.  HENT:     Head: Normocephalic and atraumatic.  Eyes:     Pupils: Pupils are equal, round, and reactive to light.  Cardiovascular:     Rate and Rhythm: Normal rate and regular rhythm.     Heart sounds: Normal heart sounds.  Pulmonary:     Effort: Pulmonary effort is normal.     Breath sounds: Normal breath sounds.  Abdominal:     General: Bowel sounds are normal.     Palpations: Abdomen is soft.  Musculoskeletal:        General: No tenderness or deformity. Normal range of  motion.     Cervical back: Normal range of motion.  Lymphadenopathy:     Cervical: No cervical adenopathy.  Skin:    General: Skin is warm and dry.     Findings: No erythema or rash.  Neurological:     Mental Status: She is alert and oriented to person, place, and time.  Psychiatric:        Behavior: Behavior normal.        Thought Content: Thought content normal.        Judgment: Judgment normal.      Lab Results  Component Value Date   WBC 8.7 09/30/2024   HGB 9.8 (L) 09/30/2024   HCT 28.8 (L) 09/30/2024   MCV 93.8 09/30/2024   PLT 215 09/30/2024   Lab Results  Component Value Date   FERRITIN 240 08/05/2024   IRON 87 08/05/2024   TIBC 272 08/05/2024   UIBC 185 08/05/2024   IRONPCTSAT 32 (H) 08/05/2024   Lab Results  Component Value Date   RETICCTPCT 0.8 08/05/2024   RBC 3.07 (L) 09/30/2024   No results found for: KPAFRELGTCHN, LAMBDASER, KAPLAMBRATIO No results found for: IGGSERUM, IGA, IGMSERUM No results found for: TOTALPROTELP, ALBUMINELP, A1GS, A2GS, EARLA BABCOCK, GAMS, MSPIKE, SPEI   Chemistry      Component Value Date/Time   NA 130 (L) 08/05/2024 1018   K 4.2 08/05/2024 1018   CL 96 (L) 08/05/2024 1018   CO2 23 08/05/2024 1018   BUN 18 08/05/2024 1018   CREATININE 1.17 (H) 08/05/2024 1018      Component Value Date/Time   CALCIUM  9.4 08/05/2024 1018   ALKPHOS 69 08/05/2024 1018   AST 32 08/05/2024 1018   ALT 26 08/05/2024 1018   BILITOT 0.3 08/05/2024 1018       Impression and Plan: Ms. Hingle is a pleasant 68 yo African American female with invasive ductal carcinoma of the right breast, ER-/PR-/HER2 +.  She ultimately underwent mastectomy treated with mastectomy.   She had 1 positive lymph node.  Unfortunately, she recurred quickly after anti-HER2 therapy.  She has had 4 cycles of chemotherapy with carboplatinum and Taxotere .  She really had a tough time with this.  We also had her on anti-HER2 therapy.    For  right now, I do not see any recurrent breast cancer.  I do not think that we have to do any scans on her.  I think the biggest issue would be the anemia.  I suspect this is probably from her kidneys.  She responds well to the Aranesp .  We will have Aranesp  for her.  I would like to get her back here in about 6 weeks or so.  I want to make sure that we get her back before the holiday season so that we can make sure that her blood counts will be okay for the Holidays.   Maude JONELLE Crease, MD 10/6/20253:18 PM

## 2024-10-01 ENCOUNTER — Ambulatory Visit: Payer: Self-pay | Admitting: Hematology & Oncology

## 2024-10-01 LAB — CANCER ANTIGEN 27.29: CA 27.29: 25.8 U/mL (ref 0.0–38.6)

## 2024-10-01 NOTE — Telephone Encounter (Signed)
 Advised via MyChart.

## 2024-10-01 NOTE — Telephone Encounter (Signed)
-----   Message from Maude JONELLE Crease sent at 10/01/2024 11:40 AM EDT ----- Please call and let her know that the tumor marker is nice and stable at 26.  Great job.  Her iron level also is doing fine.  Jeralyn ----- Message ----- From: Rebecka, [facility] [place] Sent: 09/30/2024   3:18 PM EDT To: Maude JONELLE Crease, MD

## 2024-10-23 ENCOUNTER — Inpatient Hospital Stay

## 2024-10-23 ENCOUNTER — Inpatient Hospital Stay: Admitting: Hematology & Oncology

## 2024-10-23 VITALS — BP 161/60 | HR 69 | Temp 98.8°F | Resp 17 | Wt 199.0 lb

## 2024-10-23 DIAGNOSIS — Z171 Estrogen receptor negative status [ER-]: Secondary | ICD-10-CM

## 2024-10-23 DIAGNOSIS — C50911 Malignant neoplasm of unspecified site of right female breast: Secondary | ICD-10-CM

## 2024-10-23 DIAGNOSIS — D631 Anemia in chronic kidney disease: Secondary | ICD-10-CM

## 2024-10-23 DIAGNOSIS — I82409 Acute embolism and thrombosis of unspecified deep veins of unspecified lower extremity: Secondary | ICD-10-CM

## 2024-10-23 LAB — IRON AND IRON BINDING CAPACITY (CC-WL,HP ONLY)
Iron: 84 ug/dL (ref 28–170)
Saturation Ratios: 25 % (ref 10.4–31.8)
TIBC: 343 ug/dL (ref 250–450)
UIBC: 259 ug/dL

## 2024-10-23 LAB — CMP (CANCER CENTER ONLY)
ALT: 10 U/L (ref 0–44)
AST: 20 U/L (ref 15–41)
Albumin: 3.9 g/dL (ref 3.5–5.0)
Alkaline Phosphatase: 74 U/L (ref 38–126)
Anion gap: 11 (ref 5–15)
BUN: 22 mg/dL (ref 8–23)
CO2: 24 mmol/L (ref 22–32)
Calcium: 9.2 mg/dL (ref 8.9–10.3)
Chloride: 101 mmol/L (ref 98–111)
Creatinine: 1.38 mg/dL — ABNORMAL HIGH (ref 0.44–1.00)
GFR, Estimated: 41 mL/min — ABNORMAL LOW (ref >60)
Glucose, Bld: 110 mg/dL — ABNORMAL HIGH (ref 70–99)
Potassium: 4.2 mmol/L (ref 3.5–5.1)
Sodium: 136 mmol/L (ref 135–145)
Total Bilirubin: 0.3 mg/dL (ref 0.0–1.2)
Total Protein: 6.8 g/dL (ref 6.5–8.1)

## 2024-10-23 LAB — RETICULOCYTES
Immature Retic Fract: 4.5 % (ref 2.3–15.9)
RBC.: 3.67 MIL/uL — ABNORMAL LOW (ref 3.87–5.11)
Retic Count, Absolute: 65 K/uL (ref 19.0–186.0)
Retic Ct Pct: 1.8 % (ref 0.4–3.1)

## 2024-10-23 LAB — CBC WITH DIFFERENTIAL (CANCER CENTER ONLY)
Abs Immature Granulocytes: 0.04 K/uL (ref 0.00–0.07)
Basophils Absolute: 0 K/uL (ref 0.0–0.1)
Basophils Relative: 0 %
Eosinophils Absolute: 0.1 K/uL (ref 0.0–0.5)
Eosinophils Relative: 1 %
HCT: 35.1 % — ABNORMAL LOW (ref 36.0–46.0)
Hemoglobin: 11.5 g/dL — ABNORMAL LOW (ref 12.0–15.0)
Immature Granulocytes: 1 %
Lymphocytes Relative: 22 %
Lymphs Abs: 1.8 K/uL (ref 0.7–4.0)
MCH: 31.8 pg (ref 26.0–34.0)
MCHC: 32.8 g/dL (ref 30.0–36.0)
MCV: 97 fL (ref 80.0–100.0)
Monocytes Absolute: 0.7 K/uL (ref 0.1–1.0)
Monocytes Relative: 8 %
Neutro Abs: 5.6 K/uL (ref 1.7–7.7)
Neutrophils Relative %: 68 %
Platelet Count: 221 K/uL (ref 150–400)
RBC: 3.62 MIL/uL — ABNORMAL LOW (ref 3.87–5.11)
RDW: 13.6 % (ref 11.5–15.5)
WBC Count: 8.2 K/uL (ref 4.0–10.5)
nRBC: 0 % (ref 0.0–0.2)

## 2024-10-23 LAB — FERRITIN: Ferritin: 163 ng/mL (ref 11–307)

## 2024-10-23 MED ORDER — LIDOCAINE 5 % EX PTCH: 1.0000 | MEDICATED_PATCH | CUTANEOUS | 4 refills | Status: AC

## 2024-10-23 NOTE — Progress Notes (Signed)
 K. Hematology and Oncology Follow Up Visit  Brandi Roberts [id number] 07/20/1956 68 y.o. 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  She comes in with her husband.  She is doing okay.  Her main complaint has been occasional sharp pain over on the right lateral chest wall.  This is where she had her mastectomy.  She says this happens on occasion.  Nothing really triggers it.  She also says she has pain in the anterior aspect of the right upper arm.  There is no shoulder pain.  She has had no cough.  She has had no fever.  She has had no bleeding.  She has had no rashes.  There is been no blisters.  They just got back from [facility].  They are down there for a couple of days.  That a wonderful time down there..  She feels well.  She really has had no complaints.  She has had no problems with fatigue or weakness.  She has had no change in bowel or bladder habits.  She has not noted any problems with cough or shortness of breath.  She has had no rashes.  There has been no bleeding.  Her last CA 27.29 was holding steady at 26.  I suspect we are likely going to have to get a scan to see what might be going on.  I believe this is probably scar tissue that might be pinching an intercostal nerve.  She has had no change in bowel or bladder habits.  Her appetite has been good.  As always she will have  Her last CA 27.29 was 30.3.  She has half good appetite.  She has had no nausea or vomiting.  There has been no bleeding.  She  has had no headache.  She has had no exposure to COVID.  She has done incredibly well with the Aranesp .  Her iron studies that we did when she was last here showed a ferritin of 259 with iron saturation of 28%.   Medications:  Allergies as of 10/23/2024       Reactions   Levofloxacin Nausea And Vomiting, Other (See Comments)   dizziness Other Reaction(s): Other (See Comments)    dizziness        Medication List        Accurate as of October 23, 2024  3:38 PM. If you have any questions, ask your nurse or doctor.          albuterol  108 (90 Base) MCG/ACT inhaler Commonly known as: VENTOLIN  HFA Inhale 2 puffs into the lungs every 6 (six) hours as needed.   ALPRAZolam  0.25 MG tablet Commonly known as: XANAX  Take 1 tablet (0.25 mg total) by mouth 3 (three) times daily as needed for anxiety.   amLODipine 10 MG tablet Commonly known as: NORVASC Take 10 mg by mouth daily.   atorvastatin  10 MG tablet Commonly known as: LIPITOR Take 5 mg by mouth at bedtime.   benazepril  40 MG tablet Commonly known as: LOTENSIN  Take 40 mg by  mouth daily.   diphenoxylate -atropine  2.5-0.025 MG tablet Commonly known as: LOMOTIL  Take 2 tablets by mouth 4 (four) times daily as needed for diarrhea or loose stools.   doxazosin 2 MG tablet Commonly known as: CARDURA Take 1 mg by mouth daily.   Eliquis 5 MG Tabs tablet Generic drug: apixaban Take 5 mg by mouth 2 (two) times daily.   gabapentin  600 MG tablet Commonly known as: NEURONTIN  Take 600 mg by mouth at bedtime.   hydrALAZINE  50 MG tablet Commonly known as: APRESOLINE  Take 50 mg by mouth 2 (two) times daily.   ibuprofen  600 MG tablet Commonly known as: ADVIL  Take 1 tablet (600 mg total) by mouth every 6 (six) hours as needed.   loperamide  HCl 1 MG/7.5ML suspension Commonly known as: IMODIUM  Take 15 mLs (2 mg total) by mouth as needed for diarrhea or loose stools.   magnesium oxide 400 MG tablet Commonly known as:  MAG-OX Take 1 tablet by mouth 2 (two) times daily.   metFORMIN 1000 MG tablet Commonly known as: GLUCOPHAGE Take 1,000 mg by mouth 2 (two) times daily.   metoprolol  succinate 100 MG 24 hr tablet Commonly known as: TOPROL -XL Take 100 mg by mouth daily.   potassium chloride  SA 20 MEQ tablet Commonly known as: KLOR-CON  M Take 20 mEq by mouth daily.   triamterene -hydrochlorothiazide  75-50 MG tablet Commonly known as: MAXZIDE  Take 1 tablet by mouth daily.   Vitamin D (Ergocalciferol) 1.25 MG (50000 UNIT) Caps capsule Commonly known as: DRISDOL Take 50,000 Units by mouth once a week.        Allergies:  Allergies  Allergen Reactions   Levofloxacin Nausea And Vomiting and Other (See Comments)    dizziness  Other Reaction(s): Other (See Comments)    dizziness    Past Medical History, Surgical history, Social history, and Family History were reviewed and updated.  Review of Systems: Review of Systems  Constitutional: Negative.   HENT: Negative.    Eyes: Negative.   Respiratory: Negative.    Cardiovascular: Negative.   Gastrointestinal: Negative.   Genitourinary: Negative.   Musculoskeletal: Negative.   Skin: Negative.   Neurological: Negative.   Endo/Heme/Allergies: Negative.   Psychiatric/Behavioral: Negative.       Physical Exam: Her vital signs show temperature of 98.8.  Pulse 69.  Blood pressure 161/60.  Weight is 199 pounds.    Wt Readings from Last 3 Encounters:  10/23/24 199 lb (90.3 kg)  08/05/24 197 lb (89.4 kg)  07/31/24 194 lb 0.1 oz (88 kg)    Physical Exam Vitals reviewed.  Constitutional:      Comments: Her breast exam shows left breast with no masses, edema or erythema.  There is no left axillary adenopathy.  Right chest wall shows healed mastectomy scar.  There is little bit of hyperpigmentation.  She has had no masses.  There is no erythema.  There is no right axillary adenopathy.  HENT:     Head: Normocephalic and atraumatic.  Eyes:      Pupils: Pupils are equal, round, and reactive to light.  Cardiovascular:     Rate and Rhythm: Normal rate and regular rhythm.     Heart sounds: Normal heart sounds.  Pulmonary:     Effort: Pulmonary effort is normal.     Breath sounds: Normal breath sounds.  Abdominal:     General: Bowel sounds are normal.     Palpations: Abdomen is soft.  Musculoskeletal:        General: No tenderness or  deformity. Normal range of motion.     Cervical back: Normal range of motion.  Lymphadenopathy:     Cervical: No cervical adenopathy.  Skin:    General: Skin is warm and dry.     Findings: No erythema or rash.  Neurological:     Mental Status: She is alert and oriented to person, place, and time.  Psychiatric:        Behavior: Behavior normal.        Thought Content: Thought content normal.        Judgment: Judgment normal.      Lab Results  Component Value Date   WBC 8.2 10/23/2024   HGB 11.5 (L) 10/23/2024   HCT 35.1 (L) 10/23/2024   MCV 97.0 10/23/2024   PLT 221 10/23/2024   Lab Results  Component Value Date   FERRITIN 259 09/30/2024   IRON 97 09/30/2024   TIBC 350 09/30/2024   UIBC 253 09/30/2024   IRONPCTSAT 28 09/30/2024   Lab Results  Component Value Date   RETICCTPCT 1.8 10/23/2024   RBC 3.67 (L) 10/23/2024   RBC 3.62 (L) 10/23/2024   No results found for: KPAFRELGTCHN, LAMBDASER, KAPLAMBRATIO No results found for: IGGSERUM, IGA, IGMSERUM No results found for: STEPHANY CARLOTA BENSON MARKEL EARLA JOANNIE, GAMS, MSPIKE, SPEI   Chemistry      Component Value Date/Time   NA 132 (L) 09/30/2024 1458   K 4.0 09/30/2024 1458   CL 99 09/30/2024 1458   CO2 23 09/30/2024 1458   BUN 32 (H) 09/30/2024 1458   CREATININE 1.42 (H) 09/30/2024 1458      Component Value Date/Time   CALCIUM  9.4 09/30/2024 1458   ALKPHOS 71 09/30/2024 1458   AST 22 09/30/2024 1458   ALT 12 09/30/2024 1458   BILITOT 0.4 09/30/2024 1458       Impression  and Plan: Ms. Pluta is a pleasant 68 yo African American female with invasive ductal carcinoma of the right breast, ER-/PR-/HER2 +.  She ultimately underwent mastectomy treated with mastectomy.   She had 1 positive lymph node.  Unfortunately, she recurred quickly after anti-HER2 therapy.  She has had 4 cycles of chemotherapy with carboplatinum and Taxotere .  She really had a tough time with this.  We also had her on anti-HER2 therapy.    For right now, I do not see any recurrent breast cancer.  However, I am not sure what is going on with this pain in the right chest wall.  Again I think a PET scan probably will be needed.  We will see back in the PET scan set up in about 3 weeks.  Again she does not need any Aranesp  today.  Hemoglobin is doing quite well.  I would like to see her back sometime in December.    Maude JONELLE Crease, MD 10/29/20253:38 PM

## 2024-10-24 ENCOUNTER — Ambulatory Visit: Payer: Self-pay | Admitting: Hematology & Oncology

## 2024-10-24 LAB — CANCER ANTIGEN 27.29: CA 27.29: 37.3 U/mL (ref 0.0–38.6)

## 2024-10-25 ENCOUNTER — Telehealth: Payer: Self-pay

## 2024-10-25 ENCOUNTER — Other Ambulatory Visit (HOSPITAL_COMMUNITY): Payer: Self-pay

## 2024-10-25 ENCOUNTER — Encounter: Payer: Self-pay | Admitting: Hematology & Oncology

## 2024-10-25 NOTE — Telephone Encounter (Signed)
 Oral Oncology Patient Advocate Encounter   Received notification that prior authorization for lidocaine  (LIDODERM ) 5 %  is required.   PA submitted on 10/25/2024 Key [id number] Status is pending      Charlott Hamilton,  CPhT-Adv  she/her/hers [facility]  [facility] Pharmacy Technician Patient Advocate Specialist III [facility] Phone: [phone]  Fax: [phone] [email or web address]

## 2024-10-25 NOTE — Telephone Encounter (Signed)
 Oral Oncology Patient Advocate Encounter  Prior Authorization for lidocaine  (LIDODERM ) 5 %  has been approved.    PA# [id number] Effective dates: 10/25/2024 through 10/25/2025  Patient has been notified via MyChart   Charlott Hamilton,  CPhT-Adv  she/her/hers [facility]  [facility] Pharmacy Technician Patient Advocate Specialist III [facility] Phone: [phone]  Fax: [phone] [email or web address]

## 2024-11-08 ENCOUNTER — Encounter (HOSPITAL_COMMUNITY)
Admission: RE | Admit: 2024-11-08 | Discharge: 2024-11-08 | Disposition: A | Discharge status: Home / Self Care | Source: Ambulatory Visit | Attending: Hematology & Oncology | Admitting: Hematology & Oncology

## 2024-11-08 DIAGNOSIS — C50911 Malignant neoplasm of unspecified site of right female breast: Secondary | ICD-10-CM | POA: Diagnosis present

## 2024-11-08 DIAGNOSIS — Z171 Estrogen receptor negative status [ER-]: Secondary | ICD-10-CM | POA: Insufficient documentation

## 2024-11-08 LAB — GLUCOSE, CAPILLARY: Glucose-Capillary: 83 mg/dL (ref 70–99)

## 2024-11-08 MED ORDER — FLUDEOXYGLUCOSE F - 18 (FDG) INJECTION
9.9200 | Freq: Once | INTRAVENOUS | Status: AC
  Administered 2024-11-08: 9.92 via INTRAVENOUS

## 2024-11-11 ENCOUNTER — Other Ambulatory Visit

## 2024-11-11 ENCOUNTER — Inpatient Hospital Stay

## 2024-11-11 ENCOUNTER — Ambulatory Visit

## 2024-11-11 ENCOUNTER — Ambulatory Visit: Payer: Self-pay | Admitting: Hematology & Oncology

## 2024-11-11 ENCOUNTER — Ambulatory Visit: Admitting: Hematology & Oncology

## 2024-12-11 ENCOUNTER — Inpatient Hospital Stay: Attending: Hematology & Oncology

## 2024-12-11 ENCOUNTER — Inpatient Hospital Stay

## 2024-12-11 ENCOUNTER — Inpatient Hospital Stay: Admitting: Hematology & Oncology

## 2024-12-11 ENCOUNTER — Encounter: Payer: Self-pay | Admitting: Hematology & Oncology

## 2024-12-11 VITALS — BP 122/59 | HR 64 | Temp 98.3°F | Resp 20 | Ht 66.0 in | Wt 193.0 lb

## 2024-12-11 DIAGNOSIS — Z79899 Other long term (current) drug therapy: Secondary | ICD-10-CM | POA: Insufficient documentation

## 2024-12-11 DIAGNOSIS — C50911 Malignant neoplasm of unspecified site of right female breast: Secondary | ICD-10-CM

## 2024-12-11 DIAGNOSIS — T451X5A Adverse effect of antineoplastic and immunosuppressive drugs, initial encounter: Secondary | ICD-10-CM | POA: Diagnosis not present

## 2024-12-11 DIAGNOSIS — Z7984 Long term (current) use of oral hypoglycemic drugs: Secondary | ICD-10-CM | POA: Diagnosis not present

## 2024-12-11 DIAGNOSIS — Z171 Estrogen receptor negative status [ER-]: Secondary | ICD-10-CM

## 2024-12-11 DIAGNOSIS — Z9011 Acquired absence of right breast and nipple: Secondary | ICD-10-CM | POA: Diagnosis not present

## 2024-12-11 DIAGNOSIS — Z1732 Human epidermal growth factor receptor 2 negative status: Secondary | ICD-10-CM | POA: Diagnosis not present

## 2024-12-11 DIAGNOSIS — Z1722 Progesterone receptor negative status: Secondary | ICD-10-CM | POA: Insufficient documentation

## 2024-12-11 DIAGNOSIS — D631 Anemia in chronic kidney disease: Secondary | ICD-10-CM | POA: Diagnosis not present

## 2024-12-11 DIAGNOSIS — D6481 Anemia due to antineoplastic chemotherapy: Secondary | ICD-10-CM | POA: Diagnosis not present

## 2024-12-11 LAB — IRON AND IRON BINDING CAPACITY (CC-WL,HP ONLY)
Iron: 59 ug/dL (ref 28–170)
Saturation Ratios: 18 % (ref 10.4–31.8)
TIBC: 322 ug/dL (ref 250–450)
UIBC: 263 ug/dL

## 2024-12-11 LAB — CBC WITH DIFFERENTIAL (CANCER CENTER ONLY)
Abs Immature Granulocytes: 0.07 K/uL (ref 0.00–0.07)
Basophils Absolute: 0 K/uL (ref 0.0–0.1)
Basophils Relative: 0 %
Eosinophils Absolute: 0.1 K/uL (ref 0.0–0.5)
Eosinophils Relative: 1 %
HCT: 31.6 % — ABNORMAL LOW (ref 36.0–46.0)
Hemoglobin: 10.8 g/dL — ABNORMAL LOW (ref 12.0–15.0)
Immature Granulocytes: 1 %
Lymphocytes Relative: 19 %
Lymphs Abs: 1.7 K/uL (ref 0.7–4.0)
MCH: 31.6 pg (ref 26.0–34.0)
MCHC: 34.2 g/dL (ref 30.0–36.0)
MCV: 92.4 fL (ref 80.0–100.0)
Monocytes Absolute: 0.7 K/uL (ref 0.1–1.0)
Monocytes Relative: 8 %
Neutro Abs: 6.7 K/uL (ref 1.7–7.7)
Neutrophils Relative %: 71 %
Platelet Count: 211 K/uL (ref 150–400)
RBC: 3.42 MIL/uL — ABNORMAL LOW (ref 3.87–5.11)
RDW: 12.3 % (ref 11.5–15.5)
WBC Count: 9.2 K/uL (ref 4.0–10.5)
nRBC: 0 % (ref 0.0–0.2)

## 2024-12-11 LAB — CMP (CANCER CENTER ONLY)
ALT: 11 U/L (ref 0–44)
AST: 22 U/L (ref 15–41)
Albumin: 4.2 g/dL (ref 3.5–5.0)
Alkaline Phosphatase: 65 U/L (ref 38–126)
Anion gap: 11 (ref 5–15)
BUN: 33 mg/dL — ABNORMAL HIGH (ref 8–23)
CO2: 25 mmol/L (ref 22–32)
Calcium: 9.5 mg/dL (ref 8.9–10.3)
Chloride: 96 mmol/L — ABNORMAL LOW (ref 98–111)
Creatinine: 1.55 mg/dL — ABNORMAL HIGH (ref 0.44–1.00)
GFR, Estimated: 36 mL/min — ABNORMAL LOW (ref >60)
Glucose, Bld: 107 mg/dL — ABNORMAL HIGH (ref 70–99)
Potassium: 4 mmol/L (ref 3.5–5.1)
Sodium: 131 mmol/L — ABNORMAL LOW (ref 135–145)
Total Bilirubin: 0.3 mg/dL (ref 0.0–1.2)
Total Protein: 7 g/dL (ref 6.5–8.1)

## 2024-12-11 LAB — FERRITIN: Ferritin: 254 ng/mL (ref 11–307)

## 2024-12-11 MED ORDER — DARBEPOETIN ALFA 300 MCG/0.6ML IJ SOSY
300.0000 ug | PREFILLED_SYRINGE | Freq: Once | INTRAMUSCULAR | Status: AC
  Administered 2024-12-11: 16:00:00 300 ug via SUBCUTANEOUS
  Filled 2024-12-11: qty 0.6

## 2024-12-11 NOTE — Patient Instructions (Signed)

## 2024-12-11 NOTE — Patient Instructions (Signed)

## 2024-12-11 NOTE — Progress Notes (Signed)
 K. Hematology and Oncology Follow Up Visit  Brandi Roberts [id number] 03-16-1956 68 y.o. 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  She had a very nice Thanksgiving.  She is looking forward to Christmas..  She feels well.  She really has no specific complaints.  We did do a PET scan on her.  This was done on 11/08/2024.  The PET scan did not show any evidence of active malignancy.  She has had no problems with bowels or bladder.  There is been no bleeding.  She was on Mounjaro.  She is now off this.  She has lost some weight with it.  She has had no rashes.  There is been no leg swelling.  Her last iron studies that we did back in October showed a ferritin of 163 with an iron saturation of 25%.  She has had no nausea or vomiting.  Overall, I would say that her performance status is probably ECOG 1.  Medications:  Allergies as of 12/11/2024       Reactions   Levofloxacin Nausea And Vomiting, Other (See Comments)   dizziness Other Reaction(s): Other (See Comments)    dizziness        Medication List        Accurate as of December 11, 2024  3:25 PM. If you have any questions, ask your nurse or doctor.          albuterol  108 (90 Base) MCG/ACT inhaler Commonly known as: VENTOLIN  HFA Inhale 2 puffs into the lungs every 6 (six) hours as needed.   ALPRAZolam  0.25 MG tablet Commonly known as: XANAX  Take 1 tablet (0.25 mg total) by mouth 3 (three) times daily as needed for anxiety.   amLODipine 10  MG tablet Commonly known as: NORVASC Take 10 mg by mouth daily.   atorvastatin  10 MG tablet Commonly known as: LIPITOR Take 5 mg by mouth at bedtime.   benazepril  40 MG tablet Commonly known as: LOTENSIN  Take 40 mg by mouth daily.   citalopram 20 MG tablet Commonly known as: CELEXA Take 20 mg by mouth daily.   diphenoxylate -atropine  2.5-0.025 MG tablet Commonly known as: LOMOTIL  Take 2 tablets by mouth 4 (four) times daily as needed for diarrhea or loose stools.   doxazosin 2 MG tablet Commonly known as: CARDURA Take 1 mg by mouth daily.   Eliquis 5 MG Tabs tablet Generic drug: apixaban Take 5 mg by mouth 2 (two) times daily.   gabapentin  600 MG tablet Commonly known as: NEURONTIN  Take 600 mg by mouth at bedtime.   hydrALAZINE  50 MG tablet Commonly known as: APRESOLINE  Take 50 mg by mouth 2 (two) times daily.   ibuprofen  600 MG tablet Commonly known as: ADVIL  Take 1 tablet (600 mg total) by mouth every 6 (six) hours as needed.   lidocaine  5 % Commonly known as: LIDODERM  Place 1 patch onto the skin daily. Remove & Discard patch within 12 hours or as directed by MD   loperamide  HCl 1 MG/7.5ML suspension Commonly known as: IMODIUM  Take 15  mLs (2 mg total) by mouth as needed for diarrhea or loose stools.   magnesium oxide 400 MG tablet Commonly known as: MAG-OX Take 1 tablet by mouth 2 (two) times daily.   metFORMIN 1000 MG tablet Commonly known as: GLUCOPHAGE Take 1,000 mg by mouth 2 (two) times daily.   metoprolol  succinate 100 MG 24 hr tablet Commonly known as: TOPROL -XL Take 100 mg by mouth daily.   potassium chloride  SA 20 MEQ tablet Commonly known as: KLOR-CON  M Take 20 mEq by mouth daily.   triamterene -hydrochlorothiazide  75-50 MG tablet Commonly known as: MAXZIDE  Take 1 tablet by mouth daily.   Vitamin D (Ergocalciferol) 1.25 MG (50000 UNIT) Caps capsule Commonly known as: DRISDOL Take 50,000 Units by mouth once a week.        Allergies:   Allergies  Allergen Reactions   Levofloxacin Nausea And Vomiting and Other (See Comments)    dizziness  Other Reaction(s): Other (See Comments)    dizziness    Past Medical History, Surgical history, Social history, and Family History were reviewed and updated.  Review of Systems: Review of Systems  Constitutional: Negative.   HENT: Negative.    Eyes: Negative.   Respiratory: Negative.    Cardiovascular: Negative.   Gastrointestinal: Negative.   Genitourinary: Negative.   Musculoskeletal: Negative.   Skin: Negative.   Neurological: Negative.   Endo/Heme/Allergies: Negative.   Psychiatric/Behavioral: Negative.       Physical Exam: Her vital signs show temperature of 98.8.  Pulse 69.  Blood pressure 122/59.  Weight is 193 pounds.    Wt Readings from Last 3 Encounters:  12/11/24 193 lb (87.5 kg)  10/23/24 199 lb (90.3 kg)  08/05/24 197 lb (89.4 kg)    Physical Exam Vitals reviewed.  Constitutional:      Comments: Her breast exam shows left breast with no masses, edema or erythema.  There is no left axillary adenopathy.  Right chest wall shows healed mastectomy scar.  There is little bit of hyperpigmentation.  She has had no masses.  There is no erythema.  There is no right axillary adenopathy.  HENT:     Head: Normocephalic and atraumatic.  Eyes:     Pupils: Pupils are equal, round, and reactive to light.  Cardiovascular:     Rate and Rhythm: Normal rate and regular rhythm.     Heart sounds: Normal heart sounds.  Pulmonary:     Effort: Pulmonary effort is normal.     Breath sounds: Normal breath sounds.  Abdominal:     General: Bowel sounds are normal.     Palpations: Abdomen is soft.  Musculoskeletal:        General: No tenderness or deformity. Normal range of motion.     Cervical back: Normal range of motion.  Lymphadenopathy:     Cervical: No cervical adenopathy.  Skin:    General: Skin is warm and dry.     Findings: No erythema or rash.  Neurological:      Mental Status: She is alert and oriented to person, place, and time.  Psychiatric:        Behavior: Behavior normal.        Thought Content: Thought content normal.        Judgment: Judgment normal.      Lab Results  Component Value Date   WBC 9.2 12/11/2024   HGB 10.8 (L) 12/11/2024   HCT 31.6 (L) 12/11/2024   MCV 92.4 12/11/2024   PLT 211 12/11/2024   Lab  Results  Component Value Date   FERRITIN 163 10/23/2024   IRON 84 10/23/2024   TIBC 343 10/23/2024   UIBC 259 10/23/2024   IRONPCTSAT 25 10/23/2024   Lab Results  Component Value Date   RETICCTPCT 1.8 10/23/2024   RBC 3.42 (L) 12/11/2024   No results found for: KPAFRELGTCHN, LAMBDASER, KAPLAMBRATIO No results found for: IGGSERUM, IGA, IGMSERUM No results found for: STEPHANY CARLOTA BENSON MARKEL EARLA JOANNIE DOC VICK, SPEI   Chemistry      Component Value Date/Time   NA 136 10/23/2024 1452   K 4.2 10/23/2024 1452   CL 101 10/23/2024 1452   CO2 24 10/23/2024 1452   BUN 22 10/23/2024 1452   CREATININE 1.38 (H) 10/23/2024 1452      Component Value Date/Time   CALCIUM  9.2 10/23/2024 1452   ALKPHOS 74 10/23/2024 1452   AST 20 10/23/2024 1452   ALT 10 10/23/2024 1452   BILITOT 0.3 10/23/2024 1452       Impression and Plan: Brandi Roberts is a pleasant 68 yo African American female with invasive ductal carcinoma of the right breast, ER-/PR-/HER2 +.  She ultimately underwent mastectomy treated with mastectomy.   She had 1 positive lymph node.  Unfortunately, she recurred quickly after anti-HER2 therapy.  She has had 4 cycles of chemotherapy with carboplatinum and Taxotere .  She really had a tough time with this.  We also had her on anti-HER2 therapy.    For right now, I do not see any recurrent breast cancer.  The PET scan is quite reassuring.  I am happy about this.  We will going give her Aranesp  today.  I think this will help.  I want to make sure that her blood  is as good as possible before the holiday week.  We will see what her iron levels are.  We will plan to get her back in another 6 weeks or so.     Brandi JONELLE Crease, MD 12/17/20253:25 PM

## 2024-12-12 DIAGNOSIS — C50911 Malignant neoplasm of unspecified site of right female breast: Secondary | ICD-10-CM

## 2024-12-12 LAB — CANCER ANTIGEN 27.29: CA 27.29: 27.1 U/mL (ref 0.0–38.6)

## 2024-12-16 ENCOUNTER — Telehealth: Payer: Self-pay | Admitting: Hematology & Oncology

## 2024-12-16 NOTE — Telephone Encounter (Signed)
 Called to schedule infusion per inbasket. LVM to return call for scheduling.

## 2024-12-27 ENCOUNTER — Inpatient Hospital Stay

## 2024-12-30 ENCOUNTER — Inpatient Hospital Stay: Attending: Hematology & Oncology

## 2024-12-30 VITALS — BP 156/76 | HR 60 | Temp 97.6°F | Resp 18

## 2024-12-30 DIAGNOSIS — Z7901 Long term (current) use of anticoagulants: Secondary | ICD-10-CM | POA: Insufficient documentation

## 2024-12-30 DIAGNOSIS — D509 Iron deficiency anemia, unspecified: Secondary | ICD-10-CM | POA: Diagnosis not present

## 2024-12-30 DIAGNOSIS — C50911 Malignant neoplasm of unspecified site of right female breast: Secondary | ICD-10-CM | POA: Insufficient documentation

## 2024-12-30 DIAGNOSIS — Z79899 Other long term (current) drug therapy: Secondary | ICD-10-CM | POA: Diagnosis not present

## 2024-12-30 DIAGNOSIS — D6481 Anemia due to antineoplastic chemotherapy: Secondary | ICD-10-CM | POA: Insufficient documentation

## 2024-12-30 DIAGNOSIS — Z7984 Long term (current) use of oral hypoglycemic drugs: Secondary | ICD-10-CM | POA: Diagnosis not present

## 2024-12-30 DIAGNOSIS — Z9221 Personal history of antineoplastic chemotherapy: Secondary | ICD-10-CM | POA: Diagnosis not present

## 2024-12-30 DIAGNOSIS — Z17420 Hormone receptor negative with human epidermal growth factor receptor 2 positive status: Secondary | ICD-10-CM | POA: Diagnosis not present

## 2024-12-30 DIAGNOSIS — Z9011 Acquired absence of right breast and nipple: Secondary | ICD-10-CM | POA: Insufficient documentation

## 2024-12-30 MED ORDER — IRON SUCROSE 300 MG IVPB - SIMPLE MED
300.0000 mg | Freq: Once | Status: AC
  Administered 2024-12-30: 300 mg via INTRAVENOUS
  Filled 2024-12-30: qty 300

## 2024-12-30 MED ORDER — SODIUM CHLORIDE 0.9 % IV SOLN: INTRAVENOUS | Status: DC

## 2025-01-22 ENCOUNTER — Inpatient Hospital Stay

## 2025-01-22 ENCOUNTER — Encounter: Payer: Self-pay | Admitting: Hematology & Oncology

## 2025-01-22 ENCOUNTER — Inpatient Hospital Stay: Admitting: Hematology & Oncology

## 2025-01-22 DIAGNOSIS — C50911 Malignant neoplasm of unspecified site of right female breast: Secondary | ICD-10-CM

## 2025-01-22 DIAGNOSIS — Z171 Estrogen receptor negative status [ER-]: Secondary | ICD-10-CM | POA: Diagnosis not present

## 2025-01-22 DIAGNOSIS — D631 Anemia in chronic kidney disease: Secondary | ICD-10-CM

## 2025-01-22 LAB — CBC WITH DIFFERENTIAL (CANCER CENTER ONLY)
Abs Immature Granulocytes: 0.02 10*3/uL (ref 0.00–0.07)
Basophils Absolute: 0 10*3/uL (ref 0.0–0.1)
Basophils Relative: 0 %
Eosinophils Absolute: 0.1 10*3/uL (ref 0.0–0.5)
Eosinophils Relative: 1 %
HCT: 34.2 % — ABNORMAL LOW (ref 36.0–46.0)
Hemoglobin: 11.9 g/dL — ABNORMAL LOW (ref 12.0–15.0)
Immature Granulocytes: 0 %
Lymphocytes Relative: 18 %
Lymphs Abs: 2 10*3/uL (ref 0.7–4.0)
MCH: 31.7 pg (ref 26.0–34.0)
MCHC: 34.8 g/dL (ref 30.0–36.0)
MCV: 91.2 fL (ref 80.0–100.0)
Monocytes Absolute: 0.8 10*3/uL (ref 0.1–1.0)
Monocytes Relative: 7 %
Neutro Abs: 8.5 10*3/uL — ABNORMAL HIGH (ref 1.7–7.7)
Neutrophils Relative %: 74 %
Platelet Count: 212 10*3/uL (ref 150–400)
RBC: 3.75 MIL/uL — ABNORMAL LOW (ref 3.87–5.11)
RDW: 12.7 % (ref 11.5–15.5)
WBC Count: 11.4 10*3/uL — ABNORMAL HIGH (ref 4.0–10.5)
nRBC: 0 % (ref 0.0–0.2)

## 2025-01-22 LAB — CMP (CANCER CENTER ONLY)
ALT: 8 U/L (ref 0–44)
AST: 18 U/L (ref 15–41)
Albumin: 4.1 g/dL (ref 3.5–5.0)
Alkaline Phosphatase: 71 U/L (ref 38–126)
Anion gap: 11 (ref 5–15)
BUN: 28 mg/dL — ABNORMAL HIGH (ref 8–23)
CO2: 26 mmol/L (ref 22–32)
Calcium: 9.6 mg/dL (ref 8.9–10.3)
Chloride: 95 mmol/L — ABNORMAL LOW (ref 98–111)
Creatinine: 1.67 mg/dL — ABNORMAL HIGH (ref 0.44–1.00)
GFR, Estimated: 32 mL/min — ABNORMAL LOW
Glucose, Bld: 98 mg/dL (ref 70–99)
Potassium: 4.1 mmol/L (ref 3.5–5.1)
Sodium: 132 mmol/L — ABNORMAL LOW (ref 135–145)
Total Bilirubin: 0.4 mg/dL (ref 0.0–1.2)
Total Protein: 7 g/dL (ref 6.5–8.1)

## 2025-01-22 LAB — FERRITIN: Ferritin: 312 ng/mL — ABNORMAL HIGH (ref 11–307)

## 2025-01-22 LAB — IRON AND IRON BINDING CAPACITY (CC-WL,HP ONLY)
Iron: 90 ug/dL (ref 28–170)
Saturation Ratios: 30 % (ref 10.4–31.8)
TIBC: 297 ug/dL (ref 250–450)
UIBC: 207 ug/dL

## 2025-01-22 MED ORDER — ALPRAZOLAM 0.25 MG PO TABS: 0.2500 mg | ORAL_TABLET | Freq: Three times a day (TID) | ORAL | 0 refills | Status: AC | PRN

## 2025-01-22 NOTE — Progress Notes (Signed)
 [facility] Follow Up Visit  Brandi Roberts [id number] 09/28/1956 69 y.o. 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  We last saw Brandi Roberts back in December.  This was right before Christmas.  She had a very nice Christmas and New Year's.  She wants to be back on Mounjaro.  She was taken off this.  She is on talk to Brandi Roberts doctor about this.  She has had no other problems.  She has had no issues with cough or shortness of breath.  She has had no problems with Influenza or COVID.  She has had no problems with rashes.  There is been no bleeding.  There is been no change in bowel or bladder habits.  She had a very nice Thanksgiving.  She is looking forward to Christmas..  She feels well.  She really has no specific complaints.  We did do a PET scan on Brandi Roberts.  This was done on 11/08/2024.  The PET scan did not show any evidence of active malignancy.  She has had no problems with bowels or bladder.  There is been no bleeding.  When we last saw Brandi Roberts, Brandi Roberts ferritin was 254 with an iron  saturation of 18%.  We did going give Brandi Roberts some IV iron .  This helped quite a bit.  Overall, I will have to say that Brandi Roberts performance status is probably ECOG 1.   Medications:  Allergies as of 01/22/2025       Reactions   Levofloxacin Nausea And Vomiting, Other (See Comments)   dizziness Other Reaction(s): Other (See Comments)    dizziness        Medication List         Accurate as of January 22, 2025  3:36 PM. If you have any questions, ask your nurse or doctor.          STOP taking these medications    ibuprofen  600 MG tablet Commonly known as: ADVIL  Stopped by: Maude Crease, MD       TAKE these medications    albuterol  108 (90 Base) MCG/ACT inhaler Commonly known as: VENTOLIN  HFA Inhale 2 puffs into the lungs every 6 (six) hours as needed.   ALPRAZolam  0.25 MG tablet Commonly known as: XANAX  TAKE 1 TABLET BY MOUTH 3 TIMES DAILY AS NEEDED FOR ANXIETY.   amLODipine 10 MG tablet Commonly known as: NORVASC Take 10 mg by mouth daily.   atorvastatin  10 MG tablet Commonly known as: LIPITOR Take 5 mg by mouth at bedtime.   benazepril  40 MG tablet Commonly known as: LOTENSIN  Take 40 mg by mouth daily.   citalopram 20 MG tablet Commonly known as: CELEXA Take 20 mg by mouth daily.   diphenoxylate -atropine  2.5-0.025 MG tablet Commonly known as: LOMOTIL  Take 2 tablets by mouth 4 (four) times daily as needed for diarrhea or loose stools.   doxazosin 2 MG tablet Commonly known as: CARDURA Take 1 mg by mouth daily.   Eliquis 5 MG Tabs tablet Generic drug: apixaban  Take 5 mg by mouth 2 (two) times daily.   gabapentin  600 MG tablet Commonly known as: NEURONTIN  Take 600 mg by mouth at bedtime.   hydrALAZINE  50 MG tablet Commonly known as: APRESOLINE  Take 50 mg by mouth 2 (two) times daily.   lidocaine  5 % Commonly known as: LIDODERM  Place 1 patch onto the skin daily. Remove & Discard patch within 12 hours or as directed by MD   loperamide  HCl 1 MG/7.5ML suspension Commonly known as: IMODIUM  Take 15 mLs (2 mg total) by mouth as needed for diarrhea or loose stools.   magnesium oxide 400 MG tablet Commonly known as: MAG-OX Take 1 tablet by mouth 2 (two) times daily.   metFORMIN 1000 MG tablet Commonly known as: GLUCOPHAGE Take 1,000 mg by mouth 2 (two) times daily.   metoprolol  succinate 100 MG 24 hr tablet Commonly known  as: TOPROL -XL Take 100 mg by mouth daily.   potassium chloride  SA 20 MEQ tablet Commonly known as: KLOR-CON  M Take 20 mEq by mouth daily.   triamterene -hydrochlorothiazide  75-50 MG tablet Commonly known as: MAXZIDE  Take 1 tablet by mouth daily.   Vitamin D (Ergocalciferol) 1.25 MG (50000 UNIT) Caps capsule Commonly known as: DRISDOL Take 50,000 Units by mouth once a week.        Allergies:  Allergies  Allergen Reactions   Levofloxacin Nausea And Vomiting and Other (See Comments)    dizziness  Other Reaction(s): Other (See Comments)    dizziness    Past Medical History, Surgical history, Social history, and Family History were reviewed and updated.  Review of Systems: Review of Systems  Constitutional: Negative.   HENT: Negative.    Eyes: Negative.   Respiratory: Negative.    Cardiovascular: Negative.   Gastrointestinal: Negative.   Genitourinary: Negative.   Musculoskeletal: Negative.   Skin: Negative.   Neurological: Negative.   Endo/Heme/Allergies: Negative.   Psychiatric/Behavioral: Negative.       Physical Exam: Brandi Roberts vital signs show temperature of 98.  Pulse 64.  Blood pressure 130/54.  Weight is 193 pounds.   .    Wt Readings from Last 3 Encounters:  01/22/25 193 lb (87.5 kg)  12/11/24 193 lb (87.5 kg)  10/23/24 199 lb (90.3 kg)    Physical Exam Vitals reviewed.  Constitutional:      Comments: Brandi Roberts breast exam shows left breast with no masses, edema or erythema.  There is no left axillary adenopathy.  Right chest wall shows healed mastectomy scar.  There is little bit of hyperpigmentation.  She has had no masses.  There is no erythema.  There is no right axillary adenopathy.  HENT:     Head: Normocephalic and atraumatic.  Eyes:     Pupils: Pupils are equal, round, and reactive to light.  Cardiovascular:     Rate and Rhythm: Normal rate and regular rhythm.     Heart sounds: Normal heart sounds.  Pulmonary:     Effort: Pulmonary effort is  normal.     Breath sounds: Normal breath sounds.  Abdominal:     General: Bowel sounds are normal.     Palpations: Abdomen is soft.  Musculoskeletal:        General: No tenderness or deformity. Normal range of motion.     Cervical back: Normal range of motion.  Lymphadenopathy:     Cervical: No cervical adenopathy.  Skin:    General: Skin is warm and dry.     Findings: No erythema or rash.  Neurological:  Mental Status: She is alert and oriented to person, place, and time.  Psychiatric:        Behavior: Behavior normal.        Thought Content: Thought content normal.        Judgment: Judgment normal.      Lab Results  Component Value Date   WBC 11.4 (H) 01/22/2025   HGB 11.9 (L) 01/22/2025   HCT 34.2 (L) 01/22/2025   MCV 91.2 01/22/2025   PLT 212 01/22/2025   Lab Results  Component Value Date   FERRITIN 254 12/11/2024   IRON  59 12/11/2024   TIBC 322 12/11/2024   UIBC 263 12/11/2024   IRONPCTSAT 18 12/11/2024   Lab Results  Component Value Date   RETICCTPCT 1.8 10/23/2024   RBC 3.75 (L) 01/22/2025   No results found for: KPAFRELGTCHN, LAMBDASER, KAPLAMBRATIO No results found for: IGGSERUM, IGA, IGMSERUM No results found for: TOTALPROTELP, ALBUMINELP, A1GS, MARKEL EARLA BABCOCK, GAMS, MSPIKE, SPEI   Chemistry      Component Value Date/Time   NA 131 (L) 12/11/2024 1455   K 4.0 12/11/2024 1455   CL 96 (L) 12/11/2024 1455   CO2 25 12/11/2024 1455   BUN 33 (H) 12/11/2024 1455   CREATININE 1.55 (H) 12/11/2024 1455      Component Value Date/Time   CALCIUM  9.5 12/11/2024 1455   ALKPHOS 65 12/11/2024 1455   AST 22 12/11/2024 1455   ALT 11 12/11/2024 1455   BILITOT 0.3 12/11/2024 1455       Impression and Plan: Brandi Roberts is a pleasant 69 yo African American female with invasive ductal carcinoma of the right breast, ER-/PR-/HER2 +.  She ultimately underwent mastectomy treated with mastectomy.   She had 1 positive lymph  node.  Unfortunately, she recurred quickly after anti-HER2 therapy.  She has had 4 cycles of chemotherapy with carboplatinum and Taxotere .  She really had a tough time with this.  We also had Brandi Roberts on anti-HER2 therapy.    For right now, I do not see any recurrent breast cancer.  Brandi Roberts last CA 27.29 in December was 27.  For right now, we will follow Brandi Roberts along.  I think that the CA 27.29 will show us  if there is a problem.  She does not need any Aranesp .  I had believe that the iron  that she got really helped Brandi Roberts.  We will now plan to get Brandi Roberts back in 6 more weeks.     Maude JONELLE Crease, MD 1/28/20263:36 PM

## 2025-01-22 NOTE — Patient Instructions (Signed)

## 2025-01-23 ENCOUNTER — Ambulatory Visit: Payer: Self-pay | Admitting: Hematology & Oncology

## 2025-01-23 LAB — CANCER ANTIGEN 27.29: CA 27.29: 29.7 U/mL (ref 0.0–38.6)

## 2025-01-23 NOTE — Telephone Encounter (Signed)
 Advised via MyChart.
